# Patient Record
Sex: Female | Born: 1955 | Race: White | Hispanic: No | Marital: Married | Smoking: Never smoker
Health system: Southern US, Community
[De-identification: ages and names within clinical notes are randomized; demographics above are authoritative.]

## PROBLEM LIST (undated history)

## (undated) DIAGNOSIS — N200 Calculus of kidney: Secondary | ICD-10-CM

## (undated) DIAGNOSIS — E079 Disorder of thyroid, unspecified: Secondary | ICD-10-CM

## (undated) DIAGNOSIS — H1855 Macular corneal dystrophy: Secondary | ICD-10-CM

## (undated) DIAGNOSIS — Z8639 Personal history of other endocrine, nutritional and metabolic disease: Secondary | ICD-10-CM

## (undated) DIAGNOSIS — Z87442 Personal history of urinary calculi: Secondary | ICD-10-CM

## (undated) DIAGNOSIS — Z8669 Personal history of other diseases of the nervous system and sense organs: Secondary | ICD-10-CM

## (undated) DIAGNOSIS — I10 Essential (primary) hypertension: Secondary | ICD-10-CM

## (undated) DIAGNOSIS — M1990 Unspecified osteoarthritis, unspecified site: Secondary | ICD-10-CM

## (undated) HISTORY — DX: Personal history of other endocrine, nutritional and metabolic disease: Z86.39

## (undated) HISTORY — PX: ENDOMETRIAL ABLATION: SHX621

## (undated) HISTORY — DX: Personal history of urinary calculi: Z87.442

## (undated) HISTORY — PX: CORNEAL TRANSPLANT: SHX108

## (undated) HISTORY — DX: Macular corneal dystrophy: H18.55

## (undated) HISTORY — DX: Personal history of other diseases of the nervous system and sense organs: Z86.69

## (undated) HISTORY — PX: OTHER SURGICAL HISTORY: SHX169

---

## 2001-08-03 ENCOUNTER — Inpatient Hospital Stay (HOSPITAL_COMMUNITY): Admission: AD | Admit: 2001-08-03 | Discharge: 2001-08-03 | Payer: Self-pay | Admitting: Obstetrics and Gynecology

## 2001-09-02 ENCOUNTER — Inpatient Hospital Stay (HOSPITAL_COMMUNITY): Admission: AD | Admit: 2001-09-02 | Discharge: 2001-09-05 | Payer: Self-pay | Admitting: Obstetrics and Gynecology

## 2001-10-14 ENCOUNTER — Other Ambulatory Visit: Admission: RE | Admit: 2001-10-14 | Discharge: 2001-10-14 | Payer: Self-pay | Admitting: Obstetrics and Gynecology

## 2003-04-22 DIAGNOSIS — H18559 Macular corneal dystrophy, unspecified eye: Secondary | ICD-10-CM

## 2003-04-22 HISTORY — DX: Macular corneal dystrophy, unspecified eye: H18.559

## 2003-07-04 ENCOUNTER — Other Ambulatory Visit: Admission: RE | Admit: 2003-07-04 | Discharge: 2003-07-04 | Payer: Self-pay | Admitting: Obstetrics and Gynecology

## 2004-06-10 ENCOUNTER — Encounter: Admission: RE | Admit: 2004-06-10 | Discharge: 2004-06-10 | Payer: Self-pay | Admitting: Emergency Medicine

## 2005-02-05 ENCOUNTER — Encounter: Admission: RE | Admit: 2005-02-05 | Discharge: 2005-02-05 | Payer: Self-pay | Admitting: Emergency Medicine

## 2005-02-17 ENCOUNTER — Other Ambulatory Visit: Admission: RE | Admit: 2005-02-17 | Discharge: 2005-02-17 | Payer: Self-pay | Admitting: Obstetrics and Gynecology

## 2006-05-05 ENCOUNTER — Encounter: Admission: RE | Admit: 2006-05-05 | Discharge: 2006-05-05 | Payer: Self-pay | Admitting: Emergency Medicine

## 2006-06-08 ENCOUNTER — Encounter: Admission: RE | Admit: 2006-06-08 | Discharge: 2006-06-08 | Payer: Self-pay | Admitting: Emergency Medicine

## 2006-07-07 ENCOUNTER — Encounter: Admission: RE | Admit: 2006-07-07 | Discharge: 2006-07-07 | Payer: Self-pay | Admitting: Diagnostic Radiology

## 2006-07-15 ENCOUNTER — Encounter: Admission: RE | Admit: 2006-07-15 | Discharge: 2006-07-15 | Payer: Self-pay | Admitting: Diagnostic Radiology

## 2006-08-05 ENCOUNTER — Encounter: Admission: RE | Admit: 2006-08-05 | Discharge: 2006-08-05 | Payer: Self-pay | Admitting: Diagnostic Radiology

## 2006-08-11 ENCOUNTER — Encounter: Admission: RE | Admit: 2006-08-11 | Discharge: 2006-08-11 | Payer: Self-pay | Admitting: Diagnostic Radiology

## 2006-08-23 ENCOUNTER — Observation Stay (HOSPITAL_COMMUNITY): Admission: AD | Admit: 2006-08-23 | Discharge: 2006-08-24 | Payer: Self-pay | Admitting: Obstetrics and Gynecology

## 2006-09-01 ENCOUNTER — Encounter: Admission: RE | Admit: 2006-09-01 | Discharge: 2006-09-01 | Payer: Self-pay | Admitting: Diagnostic Radiology

## 2007-02-18 ENCOUNTER — Encounter: Admission: RE | Admit: 2007-02-18 | Discharge: 2007-02-18 | Payer: Self-pay | Admitting: Diagnostic Radiology

## 2007-05-31 IMAGING — CR DG ABDOMEN 2V
3 series · 3 of 3 positions shown · non-contrast
Comparison: None.

CLINICAL DATA: 50-year-old, abdominal pain. 
 ABDOMEN - 2 VIEW:

[view not recorded (1 of 3)]
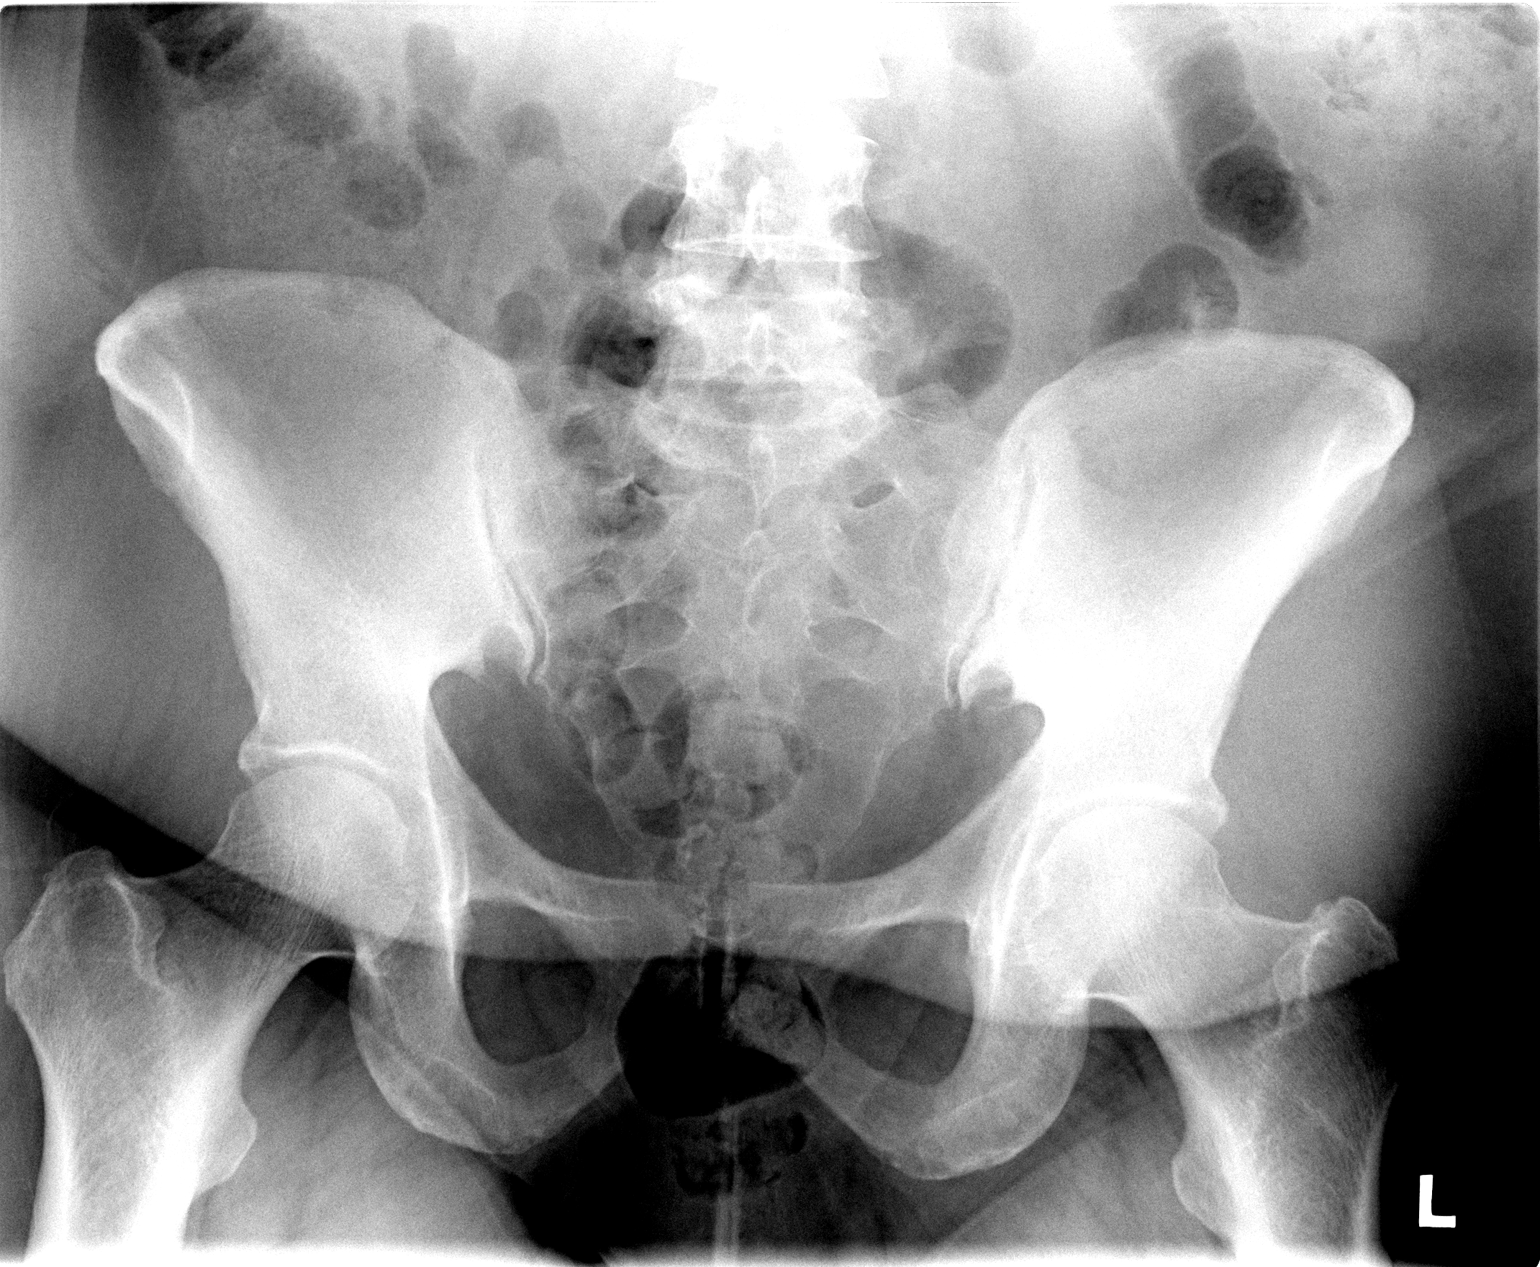

[view not recorded (2 of 3)]
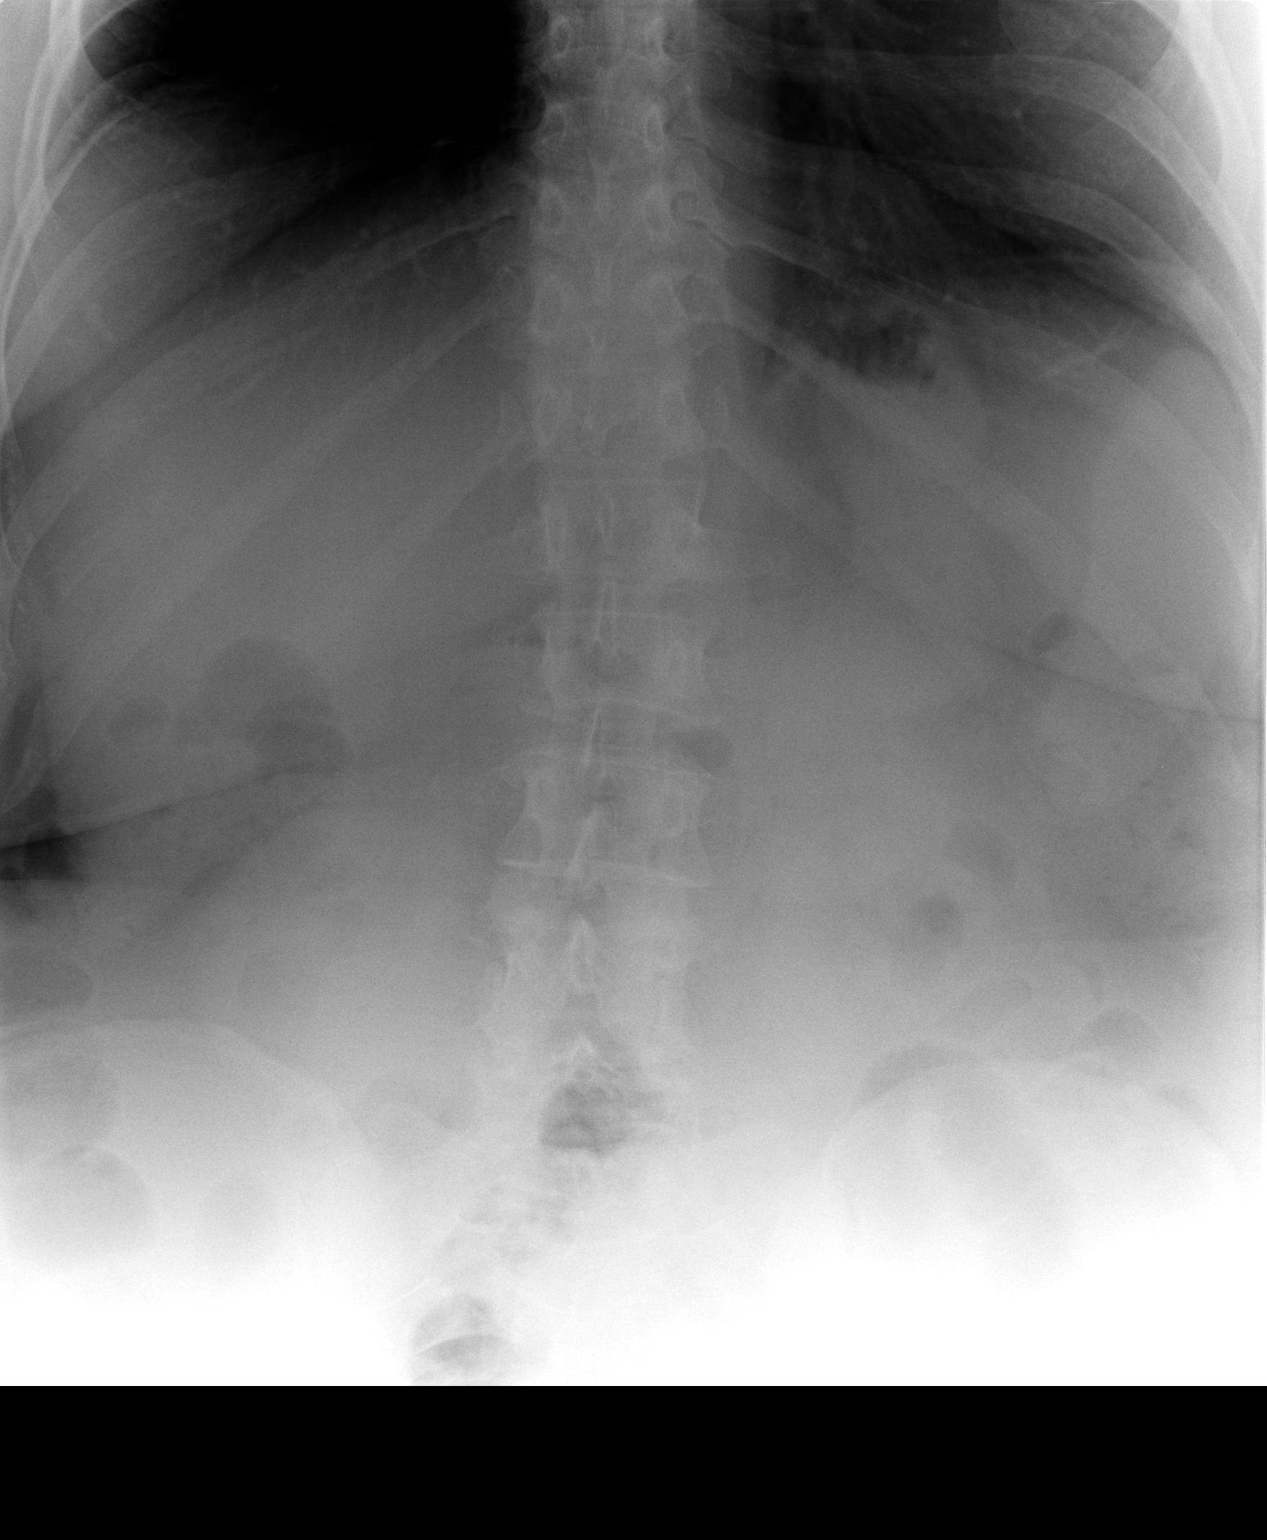

[view not recorded (3 of 3)]
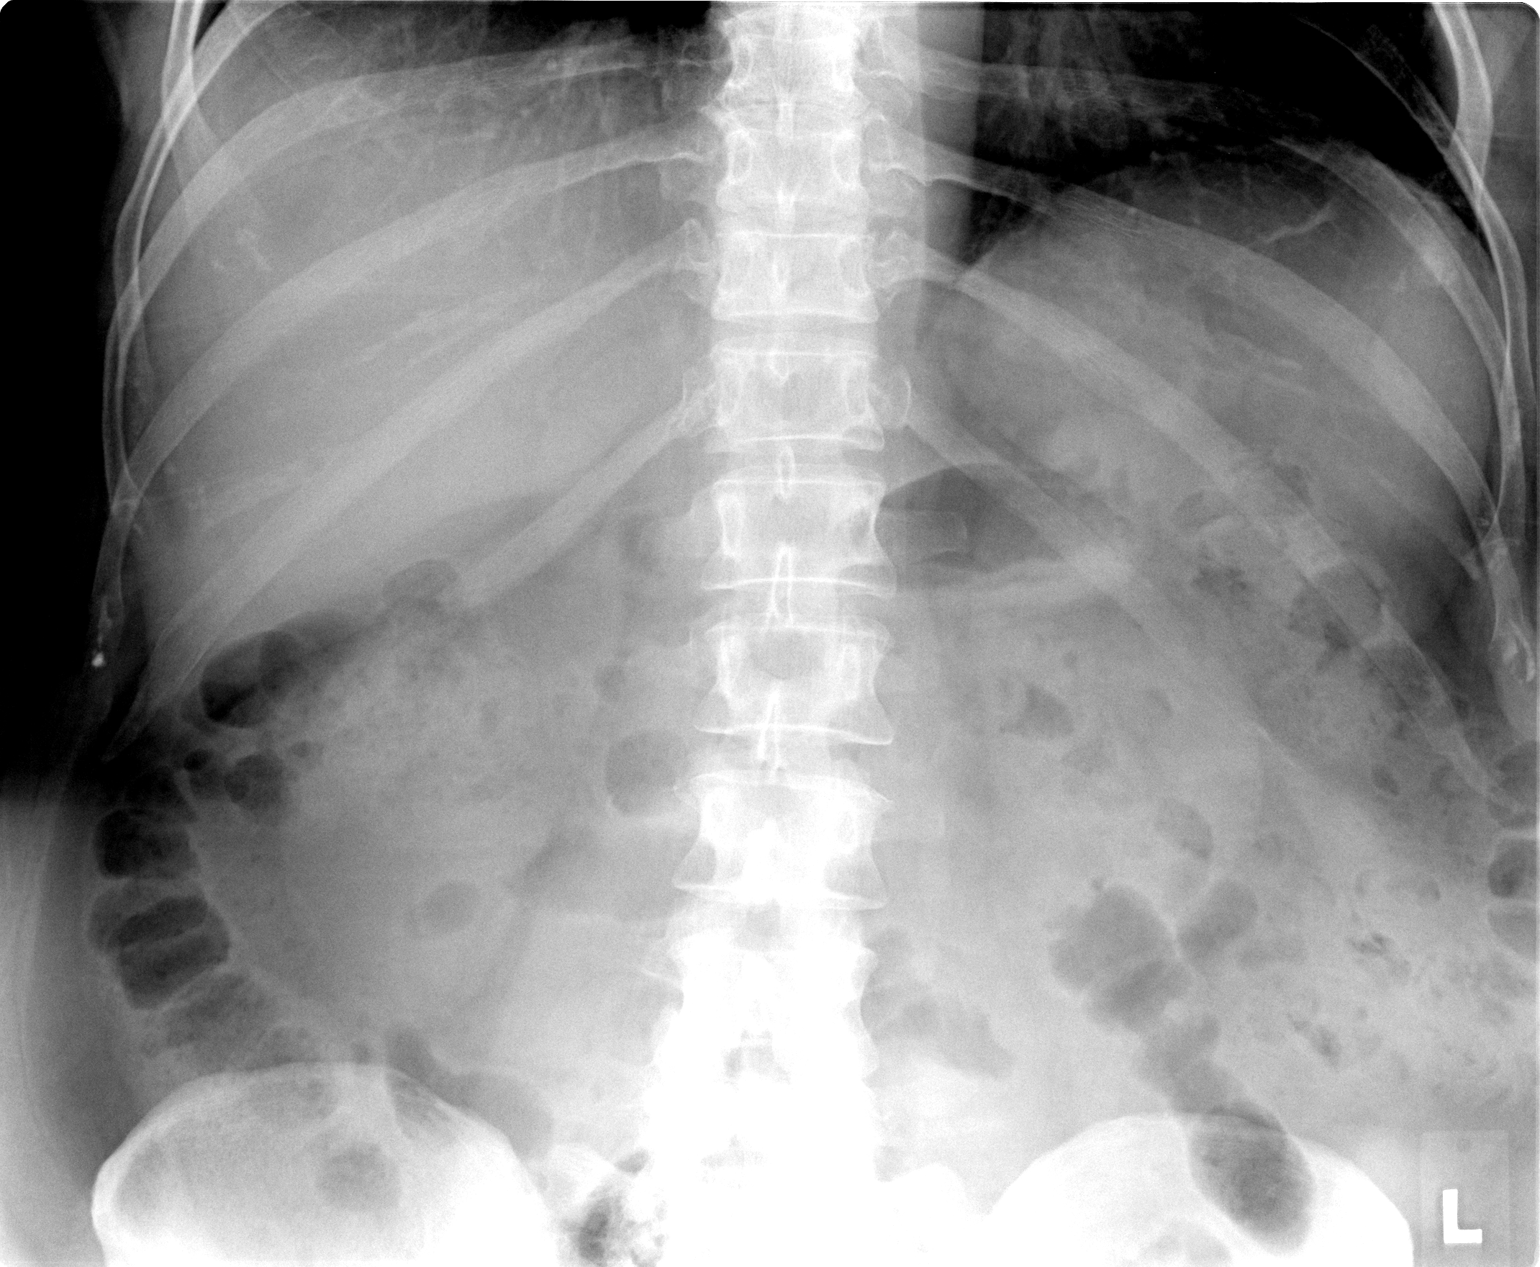

[3 of 3 positions shown; findings below may reference images not displayed]

FINDINGS: There is scattered air and stool throughout the colon along with scattered loops of small bowel with air, but no significant distention or air fluid levels.  No free air.  The soft tissue shadows of the abdomen are grossly maintained.  No worrisome calcifications are seen.  Bony structures are grossly normal.
IMPRESSION: No plain film evidence of acute abdominal process.  Mild constipation.

## 2007-08-25 ENCOUNTER — Encounter: Admission: RE | Admit: 2007-08-25 | Discharge: 2007-08-25 | Payer: Self-pay | Admitting: Emergency Medicine

## 2009-03-08 ENCOUNTER — Encounter: Admission: RE | Admit: 2009-03-08 | Discharge: 2009-03-08 | Payer: Self-pay | Admitting: Internal Medicine

## 2009-03-28 ENCOUNTER — Encounter: Admission: RE | Admit: 2009-03-28 | Discharge: 2009-03-28 | Payer: Self-pay | Admitting: Interventional Radiology

## 2011-10-11 ENCOUNTER — Emergency Department (HOSPITAL_COMMUNITY): Payer: 59

## 2011-10-11 ENCOUNTER — Emergency Department (HOSPITAL_COMMUNITY)
Admission: EM | Admit: 2011-10-11 | Discharge: 2011-10-12 | Disposition: A | Discharge status: Home / Self Care | Payer: 59 | Attending: Emergency Medicine | Admitting: Emergency Medicine

## 2011-10-11 ENCOUNTER — Encounter (HOSPITAL_COMMUNITY): Payer: Self-pay | Admitting: Emergency Medicine

## 2011-10-11 ENCOUNTER — Encounter (HOSPITAL_COMMUNITY): Payer: Self-pay

## 2011-10-11 ENCOUNTER — Emergency Department (INDEPENDENT_AMBULATORY_CARE_PROVIDER_SITE_OTHER)
Admission: EM | Admit: 2011-10-11 | Discharge: 2011-10-11 | Disposition: A | Discharge status: Nursing Home (Includes ICF) | Payer: 59 | Source: Home / Self Care | Attending: Family Medicine | Admitting: Family Medicine

## 2011-10-11 DIAGNOSIS — N200 Calculus of kidney: Secondary | ICD-10-CM | POA: Insufficient documentation

## 2011-10-11 DIAGNOSIS — M545 Low back pain, unspecified: Secondary | ICD-10-CM | POA: Insufficient documentation

## 2011-10-11 DIAGNOSIS — Z79899 Other long term (current) drug therapy: Secondary | ICD-10-CM | POA: Insufficient documentation

## 2011-10-11 DIAGNOSIS — E079 Disorder of thyroid, unspecified: Secondary | ICD-10-CM | POA: Insufficient documentation

## 2011-10-11 DIAGNOSIS — M51379 Other intervertebral disc degeneration, lumbosacral region without mention of lumbar back pain or lower extremity pain: Secondary | ICD-10-CM | POA: Insufficient documentation

## 2011-10-11 DIAGNOSIS — K7689 Other specified diseases of liver: Secondary | ICD-10-CM | POA: Insufficient documentation

## 2011-10-11 DIAGNOSIS — I10 Essential (primary) hypertension: Secondary | ICD-10-CM | POA: Insufficient documentation

## 2011-10-11 DIAGNOSIS — M5137 Other intervertebral disc degeneration, lumbosacral region: Secondary | ICD-10-CM | POA: Insufficient documentation

## 2011-10-11 DIAGNOSIS — R109 Unspecified abdominal pain: Secondary | ICD-10-CM | POA: Insufficient documentation

## 2011-10-11 HISTORY — DX: Unspecified osteoarthritis, unspecified site: M19.90

## 2011-10-11 HISTORY — DX: Calculus of kidney: N20.0

## 2011-10-11 HISTORY — DX: Disorder of thyroid, unspecified: E07.9

## 2011-10-11 HISTORY — DX: Essential (primary) hypertension: I10

## 2011-10-11 LAB — POCT URINALYSIS DIP (DEVICE)
Glucose, UA: NEGATIVE mg/dL
Nitrite: NEGATIVE
Urobilinogen, UA: 0.2 mg/dL (ref 0.0–1.0)

## 2011-10-11 LAB — POCT I-STAT, CHEM 8
HCT: 46 % (ref 36.0–46.0)
Hemoglobin: 15.6 g/dL — ABNORMAL HIGH (ref 12.0–15.0)
Potassium: 3.7 mEq/L (ref 3.5–5.1)
Sodium: 141 mEq/L (ref 135–145)

## 2011-10-11 NOTE — ED Notes (Signed)
Report given to [facility], ed triage nurse. Pt transported by shuttle.

## 2011-10-11 NOTE — ED Notes (Signed)
Pt states she has been burning with urination which started last night

## 2011-10-11 NOTE — ED Notes (Signed)
Offered pt and family coffee.

## 2011-10-11 NOTE — ED Notes (Signed)
Lower back  pain last night and this morning still having pain, vomited once. Took some pain med and it get better, but n today the pain got worse. Went to urgent and they did UA and it was clear of UTI but I still cont to have left flank pain . Hx of kidney stone in past.

## 2011-10-11 NOTE — ED Provider Notes (Signed)
History     CSN: [id number]  Arrival date & time 10/11/11  1457   First MD Initiated Contact with Patient 10/11/11 1500      Chief Complaint  Patient presents with  . Urinary Tract Infection    painful urination    (Consider location/radiation/quality/duration/timing/severity/associated sxs/prior treatment) Patient is a 56 y.o. female presenting with flank pain. The history is provided by the patient.  Flank Pain This is a new problem. The current episode started 6 to 12 hours ago. The problem has been gradually improving (took 2 vicodin and a phenergan for pain and nausea). Pertinent negatives include no abdominal pain. Nothing aggravates the symptoms.    Past Medical History  Diagnosis Date  . Hypertension   . Thyroid disease   . Osteoarthritis     History reviewed. No pertinent past surgical history.  History reviewed. No pertinent family history.  History  Substance Use Topics  . Smoking status: Never Smoker   . Smokeless tobacco: Not on file  . Alcohol Use: No    OB History    Grav Para Term Preterm Abortions TAB SAB Ect Mult Living                  Review of Systems  Constitutional: Negative.   Gastrointestinal: Negative for abdominal pain.  Genitourinary: Positive for dysuria and flank pain. Negative for urgency, frequency, hematuria, vaginal bleeding, vaginal discharge and pelvic pain.    Allergies  Penicillins; Shellfish allergy; and Sulfa antibiotics  Home Medications   Current Outpatient Rx  Name Route Sig Dispense Refill  . AMLODIPINE BESY-BENAZEPRIL HCL 5-10 MG PO CAPS Oral Take 1 capsule by mouth daily.    [name] Kitchen HYDROCHLOROTHIAZIDE 25 MG PO TABS Oral Take 25 mg by mouth daily.    . MELOXICAM 15 MG PO TABS Oral Take 15 mg by mouth daily.    . THYROID 90 MG PO TABS Oral Take 90 mg by mouth daily.    [name] Kitchen DOXYCYCLINE HYCLATE 100 MG PO CPEP Oral Take 100 mg by mouth 2 (two) times daily.      BP 153/75  Pulse 71  Temp 98 F (36.7 C) (Oral)  Resp  18  SpO2 98%  Physical Exam  Nursing note and vitals reviewed. Constitutional: She is oriented to person, place, and time. She appears well-developed and well-nourished.  Abdominal: Soft. Bowel sounds are normal. There is no tenderness. There is no rebound.  Musculoskeletal: She exhibits tenderness.       Arms: Neurological: She is alert and oriented to person, place, and time.  Skin: Skin is warm and dry.    ED Course  Procedures (including critical care time)  Labs Reviewed  POCT URINALYSIS DIP (DEVICE) - Abnormal; Notable for the following:    Bilirubin Urine SMALL (*)     All other components within normal limits   No results found.   1. Left flank pain       MDM  U/a neg.        Linna Hoff, MD 10/11/11 [phone]

## 2011-10-11 NOTE — ED Notes (Signed)
PT. SENT FROM [facility] , REPORTS LOW BACK PAIN ONSET LAST NIGHT , STATES HISTORY OF KIDNEY STONES , DENIES DYSURIA OR HEMATURIA , URINALYSIS DONE AT URGENT CARE .

## 2011-10-12 NOTE — ED Provider Notes (Signed)
History     CSN: [id number]  Arrival date & time 10/11/11  1905   First MD Initiated Contact with Patient 10/11/11 2222      Chief Complaint  Patient presents with  . Back Pain   HPI  History provided by the patient. Patient is a 56 year old female with history of hypertension, kidney stones who presents with complaints of right flank and low back pain. Patient was sent from urgent care Center for further evaluation. Patient states that her pain began earlier in the day and has been persistent. Pain is primarily over the right flank and low back area but occasionally to the left side as well. Patient had a hearing test performed at the urgent care did not show any signs for infection the patient was referred to the emergency room for further workup to rule out kidney stone. Patient does state pain feels slightly similar to previous kidney stone pain. She denies having any dysuria, hematuria, urinary frequency. Patient did have one episode of nausea and vomiting in the evening. Patient did have an old prescription of Phenergan at home and use this for her nausea and vomiting symptoms. She also used Vicodin at home for her pain. Patient currently states she has no complaints. She denies any other aggravating or alleviating factors.   Past Medical History  Diagnosis Date  . Hypertension   . Thyroid disease   . Osteoarthritis   . Kidney stone     Past Surgical History  Procedure Date  . Endometrial ablation   . Dilatation and curettage   . Corneal transplant     No family history on file.  History  Substance Use Topics  . Smoking status: Never Smoker   . Smokeless tobacco: Not on file  . Alcohol Use: No    OB History    Grav Para Term Preterm Abortions TAB SAB Ect Mult Living                  Review of Systems  Constitutional: Negative for fever and chills.  Gastrointestinal: Positive for nausea and vomiting. Negative for abdominal pain, diarrhea and constipation.    Genitourinary: Positive for flank pain. Negative for dysuria, frequency, hematuria, vaginal bleeding and vaginal discharge.  Skin: Negative for rash.    Allergies  Penicillins; Shellfish allergy; and Sulfa antibiotics  Home Medications   Current Outpatient Rx  Name Route Sig Dispense Refill  . AMLODIPINE BESY-BENAZEPRIL HCL 5-10 MG PO CAPS Oral Take 1 capsule by mouth daily.    [name] Kitchen DOXYCYCLINE HYCLATE 100 MG PO CPEP Oral Take 100 mg by mouth 2 (two) times daily.    [name] Kitchen ESTRADIOL-NORETHINDRONE ACET 1-0.5 MG PO TABS Oral Take 1 tablet by mouth daily.    [name] Kitchen HYDROCHLOROTHIAZIDE 25 MG PO TABS Oral Take 25 mg by mouth daily.    [name] Kitchen HYDROCODONE-ACETAMINOPHEN 5-500 MG PO TABS Oral Take 1 tablet by mouth every 6 (six) hours as needed. For pain    . MELOXICAM 15 MG PO TABS Oral Take 15 mg by mouth daily.    . THYROID 90 MG PO TABS Oral Take 90 mg by mouth daily.    . TRAMADOL HCL 50 MG PO TABS Oral Take 50 mg by mouth every 6 (six) hours as needed. For pain      BP 146/68  Pulse 84  Temp 98.3 F (36.8 C) (Oral)  Resp 18  SpO2 98%  Physical Exam  Nursing note and vitals reviewed. Constitutional: She is oriented to person, place,  and time. She appears well-developed and well-nourished. No distress.  HENT:  Head: Normocephalic and atraumatic.  Cardiovascular: Normal rate and regular rhythm.   Pulmonary/Chest: Effort normal and breath sounds normal.  Abdominal: Soft. There is CVA tenderness. There is no rigidity, no rebound, no guarding and no tenderness at McBurney's point.       Mild right CVA tenderness  Musculoskeletal:       Cervical back: Normal.       Thoracic back: Normal.       Lumbar back: She exhibits no bony tenderness.  Neurological: She is alert and oriented to person, place, and time. She has normal strength. No sensory deficit. Gait normal.  Skin: Skin is warm and dry. No rash noted.  Psychiatric: She has a normal mood and affect. Her behavior is normal.    ED Course   Procedures    Labs Reviewed  POCT I-STAT, CHEM 8 - Abnormal; Notable for the following:    Hemoglobin 15.6 (*)     All other components within normal limits   Ct Abdomen Pelvis Wo Contrast  10/11/2011  *RADIOLOGY REPORT*  Clinical Data: Flank pain and nausea.  CT ABDOMEN AND PELVIS WITHOUT CONTRAST  Technique:  Multidetector CT imaging of the abdomen and pelvis was performed following the standard protocol without intravenous contrast.  Comparison: CT of the abdomen and pelvis from 02/05/2005  Findings: The visualized lung bases are clear.  There is mild diffuse fatty infiltration within the liver, with areas of fatty sparing; fatty infiltration is more prominent adjacent to the falciform ligament.  The liver is otherwise unremarkable in appearance.  The spleen is within normal limits. The gallbladder is grossly unremarkable.  The pancreas and adrenal glands are within normal limits.  There is no evidence of hydronephrosis.  A tiny 2 mm stone is noted at the interpole region of the left kidney, on coronal images.  The kidneys are otherwise unremarkable in appearance.  No obstructing ureteral stones are identified.  Minimal left-sided perinephric stranding is seen.  No free fluid is identified.  The small bowel is unremarkable in appearance.  The stomach is within normal limits.  No acute vascular abnormalities are seen.  A mildly prominent peripancreatic node is seen, measuring 1.2 cm in short axis.  No additional lymphadenopathy is seen.  The appendix is normal in caliber, without evidence for appendicitis.  The colon is largely decompressed and unremarkable in appearance.  The bladder is mildly distended and grossly unremarkable in appearance.  The uterus may contain a 3.9 cm fibroid posteriorly. It is otherwise grossly unremarkable in appearance, though difficult to fully assess without contrast.  The ovaries are relatively symmetric; no suspicious adnexal masses are seen.  No inguinal lymphadenopathy  is seen.  No acute osseous abnormalities are identified.  Vacuum phenomenon and disc space narrowing are noted at L5-S1, with mild associated facet disease at the lower lumbar spine.  IMPRESSION:  1.  No acute abnormalities seen within the abdomen or pelvis. 2.  Tiny 2 mm nonobstructing stone at the interpole region of the left kidney; kidneys otherwise unremarkable in appearance. 3.  Mildly prominent peripancreatic node, measuring 1.2 cm in short axis; this has decreased in size since 2006, and likely reflects the patient's baseline. 4.  Likely 3.9 cm fibroid at the posterior aspect of the uterus, increased in size from the prior study. 5.  Mild diffuse fatty infiltration within the liver.  Original Report Authenticated By: Tonia Ghent, M.D.   Dg Lumbar Spine Complete  10/11/2011  *RADIOLOGY REPORT*  Clinical Data: Low back pain  LUMBAR SPINE - COMPLETE 4+ VIEW  Comparison: 06/10/2004  Findings: Negative for fracture.  Minimal anterior slip L4-5.  Moderate to advanced disc degeneration and spondylosis L5 S1.  Disc degeneration and spurring has progressed from the prior study. Negative for pars defect.  Facet joints are intact.  IMPRESSION: Advanced disc degeneration and spondylosis L5-S1.  No acute bony abnormality.  Original Report Authenticated By: Camelia Phenes, M.D.     1. Flank pain       MDM  Patient seen and evaluated. Patient no acute distress.    Patient currently without significant symptoms. CT and lab work unremarkable for any cause of symptoms. No signs of obstructing stone. I did discuss with patient findings of pancreatic nodule. We also discussed findings of fibroid. Patient will plan to followup with OB/GYN.  Angus Seller, [place] 10/12/11 [phone]

## 2011-10-12 NOTE — Discharge Instructions (Signed)
You were seen and evaluated for your flank and back pains. Your blood tests and CAT scan did not show any concerning or emergent causes to explain your symptoms. At this time he reported your pain and symptoms have improved. Your providers feel you may return home and followup with your primary care provider for continued evaluation and treatment. Return to emergency room if you have any worsening pain, fever, chills, persistent nausea vomiting.   Flank Pain Flank pain refers to pain that is located on the side of the body between the upper abdomen and the back. It can be caused by many things. CAUSES  Some of the more common causes of flank pain include:  Muscle strain.   Muscle spasms.   A disease of your spine (vertebral disk disease).   A lung infection (pneumonia).   Fluid around your lungs (pulmonary edema).   A kidney infection.   Kidney stones.   A very painful skin rash on only one side of your body (shingles).   Gallbladder disease.  DIAGNOSIS  Blood tests, urine tests, and X-rays may help your caregiver determine what is wrong. TREATMENT  The treatment of pain depends on the cause. Your caregiver will determine what treatment will work best for you. HOME CARE INSTRUCTIONS   Home care will depend on the cause of your pain.   Some medications may help relieve the pain. Take medication for relief of pain as directed by your caregiver.   Tell your caregiver about any changes in your pain.   Follow up with your caregiver.  SEEK IMMEDIATE MEDICAL CARE IF:   Your pain is not controlled with medication.   The pain increases.   You have abdominal pain.   You have shortness of breath.   You have persistent nausea or vomiting.   You have swelling in your abdomen.   You feel faint or pass out.   You have a temperature by mouth above 102 F (38.9 C), not controlled by medicine.  MAKE SURE YOU:   Understand these instructions.   Will watch your condition.    Will get help right away if you are not doing well or get worse.  Document Released: 05/29/2005 Document Revised: 03/27/2011 Document Reviewed: 09/22/2009 [facility] Patient Information 2012 [place], [place].    RESOURCE GUIDE  Chronic Pain Problems: Contact [facility]  [phone] Patients need to be referred by their primary care doctor.  Insufficient Money for Medicine: Contact United Way:  call "211" or [facility] [phone].  No Primary Care Doctor: - Call [facility] Connect  [phone] - can help you locate a primary care doctor that  accepts your insurance, provides certain services, etc. - Physician Referral [phone]  Agencies that provide inexpensive medical care: - [facility]  [phone] - [facility]  [phone] - [facility]  [phone] [facility]  [phone] - Planned Parenthood  [phone] [facility]  [phone]  Medicaid-accepting [facility] Providers: - [facility]- [address]  [phone], Mon-Fri 9am-7pm, Sat 9am-1pm - [facility]- [address] [place], [address] [place]  [phone] - [facility]- [address], [address] [place]  [phone] [facility]- [address]  [phone] - Renaye Rakers- [address] [place], [address], [phone]  Only accepts [place] patients after they have their name  applied to their card  Self  Pay (no insurance) in [facility]: - Sickle Cell Patients: Dr Willey Blade, [facility]  [address] [place], [phone] - [facility]- [address] [place]  [phone]       Patrcia Dolly [facility] [place]- [address]       -     [facility]- see information above (Speak to [employer] if you do not have insurance)       -  [facility]- [address] [place], [phone]       -  [facility] [place]- [address],  [phone]       -  [facility]- [address] [phone]       -  Dr Julio Sicks-  [address], [place], [phone]       -  [facility]- [address], [phone]       -  [facility]- [address] [phone], also [address] [phone]       -    [facility]- [address] [place], [phone], 1st & 3rd Saturday   every month, 10am-1pm  1) Find a Doctor and Pay Out of Pocket Although you won't have to find out who is covered by your insurance plan, it is a good idea to ask around and get recommendations. You will then need to call the office and see if the doctor you have chosen will accept you as a new patient and what types of options they offer for patients who are self-pay. Some doctors offer discounts or will set up payment plans for their patients who do not have insurance, but you will need to ask so you aren't surprised when you get to your appointment.  2) Contact Your Local Health Department Not all health departments have doctors that can see patients for sick visits, but many do, so it is worth a call to see if yours does. If you don't know where your local health department is, you can check in your phone book. The CDC also has a tool to help you locate your state's health department, and many state websites also have listings of all of their local health departments.  3) Find a Walk-in Clinic If your illness is not likely to be very severe or complicated, you may want to try a walk in clinic. These are popping up all over the country in pharmacies, drugstores, and shopping centers. They're usually staffed by nurse practitioners or physician assistants that have been trained to treat common illnesses and complaints. They're usually fairly quick and inexpensive. However, if you have serious medical issues or chronic medical problems, these are probably not your best  option  STD Testing - [facility] Department of [facility], STD Clinic, [address], [place], phone [phone] or [phone].  Monday - Friday, call for an appointment. [facility] Department of [employer], STD Clinic, [address], [place], phone [phone] or [phone].  Monday - Friday, call for an appointment.  Abuse/Neglect: [facility] Child Abuse Hotline 417-038-3744 [facility] Child Abuse Hotline 517-318-4383 (After Hours)  Emergency Shelter:  [facility] 631-643-7725  Maternity Homes: - [facility] 847-160-9287 - [facility] [phone]  MRSA Hotline #:   301-835-4174  Aaron Edelman  [employer]  [facility] [place]  [employer] [facility] Dept. [address]                 [address]         [address] [place] [address]  Blondell Reveal Phone:  [phone]                                  Phone:  [phone]                   Phone:  [phone]  [facility] Mental Health, [phone] - [phone]       -     [facility] in [place], [address],                                  [phone], [facility] Child Abuse Hotline (978) 001-9986 or [phone] (After Hours)   Behavioral Health Services  Substance Abuse Resources: - Alcohol and Drug Services  581-330-5436 - [facility] 629-026-0796 - [facility] [phone] Floydene Flock [phone] - Residential & Outpatient Substance Abuse Program  ([phone]  Psychological Services: [facility]  [phone] Services  [phone] - [facility], [phone] [place]. [address] [place], ACCESS LINE: 9413462445 or  [phone], [employer]  Dental Assistance  If unable to pay or uninsured, contact:  [facility] or [facility]. to become qualified for the adult dental clinic.  Patients with Medicaid: [facility] [phone], [phone] [address] [phone]  If unable to pay, or uninsured, contact [facility] [phone]) or [facility] Department [phone] in [place], [phone] in [facility]) to become qualified for the adult dental clinic  Other Low-Cost Community Dental Services: - [facility]- [address] [place], [place], [place], [address], [phone], [phone], 2nd and 4th Thursday of the month at 6:30am.  10 clients each day by appointment, can sometimes see walk-in patients if someone does not show for an appointment. [facility]- [address] [place], [place], [address], [phone] - [facility]- [address], [place], [place], [address], [phone] - [facility]- [phone] [facility] Health Department- [phone] [facility] Department- [phone]

## 2011-10-12 NOTE — ED Provider Notes (Signed)
Medical screening examination/treatment/procedure(s) were performed by non-physician practitioner and as supervising physician I was immediately available for consultation/collaboration.  Curry Seefeldt, MD 10/12/11 0729 

## 2011-11-24 ENCOUNTER — Ambulatory Visit (INDEPENDENT_AMBULATORY_CARE_PROVIDER_SITE_OTHER): Payer: 59 | Admitting: Surgery

## 2011-12-02 ENCOUNTER — Ambulatory Visit (INDEPENDENT_AMBULATORY_CARE_PROVIDER_SITE_OTHER): Payer: 59 | Admitting: Surgery

## 2011-12-02 ENCOUNTER — Encounter (INDEPENDENT_AMBULATORY_CARE_PROVIDER_SITE_OTHER): Payer: Self-pay | Admitting: Surgery

## 2011-12-02 VITALS — BP 160/88 | HR 88 | Temp 97.5°F | Resp 18 | Ht 64.0 in | Wt 256.0 lb

## 2011-12-02 DIAGNOSIS — D449 Neoplasm of uncertain behavior of unspecified endocrine gland: Secondary | ICD-10-CM

## 2011-12-02 DIAGNOSIS — E039 Hypothyroidism, unspecified: Secondary | ICD-10-CM

## 2011-12-02 DIAGNOSIS — E042 Nontoxic multinodular goiter: Secondary | ICD-10-CM | POA: Insufficient documentation

## 2011-12-02 DIAGNOSIS — D440 Neoplasm of uncertain behavior of thyroid gland: Secondary | ICD-10-CM

## 2011-12-02 NOTE — Patient Instructions (Signed)
Thyroid Diseases Your thyroid is a butterfly-shaped gland in your neck. It is located just above your collarbone. It is one of your endocrine glands, which make hormones. The thyroid helps set your metabolism. Metabolism is how your body gets energy from the foods you eat.  Millions of people have thyroid diseases. Women experience thyroid problems more often than men. In fact, overactive thyroid problems (hyperthyroidism) occur in 1% of all women. If you have a thyroid disease, your body may use energy more slowly or quickly than it should.  Thyroid problems also include an immune disease where your body reacts against your thyroid gland (called thyroiditis). A different problem involves lumps and bumps (called nodules) that develop in the gland. The nodules are usually, but not always, noncancerous. THE MOST COMMON THYROID PROBLEMS AND CAUSES ARE DISCUSSED BELOW There are many causes for thyroid problems. Treatment depends upon the exact diagnosis and includes trying to reset your body's metabolism to a normal rate. Hyperthyroidism Too much thyroid hormone from an overactive thyroid gland is called hyperthyroidism. In hyperthyroidism, the body's metabolism speeds up. One of the most frequent forms of hyperthyroidism is known as Graves' disease. Graves' disease tends to run in families. Although Graves' is thought to be caused by a problem with the immune system, the exact nature of the genetic problem is unknown. Hypothyroidism Too little thyroid hormone from an underactive thyroid gland is called hypothyroidism. In hypothyroidism, the body's metabolism is slowed. Several things can cause this condition. Most causes affect the thyroid gland directly and hurt its ability to make enough hormone.  Rarely, there may be a pituitary gland tumor (located near the base of the brain). The tumor can block the pituitary from producing thyroid-stimulating hormone (TSH). Your body makes TSH to stimulate the thyroid  to work properly. If the pituitary does not make enough TSH, the thyroid fails to make enough hormones needed for good health. Whether the problem is caused by thyroid conditions or by the pituitary gland, the result is that the thyroid is not making enough hormones. Hypothyroidism causes many physical and mental processes to become sluggish. The body consumes less oxygen and produces less body heat. Thyroid Nodules A thyroid nodule is a small swelling or lump in the thyroid gland. They are common. These nodules represent either a growth of thyroid tissue or a fluid-filled cyst. Both form a lump in the thyroid gland. Almost half of all people will have tiny thyroid nodules at some point in their lives. Typically, these are not noticeable until they become large and affect normal thyroid size. Larger nodules that are greater than a half inch across (about 1 centimeter) occur in about 5 percent of people. Although most nodules are not cancerous, people who have them should seek medical care to rule out cancer. Also, some thyroid nodules may produce too much thyroid hormone or become too large. Large nodules or a large gland can interfere with breathing or swallowing or may cause neck discomfort. Other problems Other thyroid problems include cancer and thyroiditis. Thyroiditis is a malfunction of the body's immune system. Normally, the immune system works to defend the body against infection and other problems. When the immune system is not working properly, it may mistakenly attack normal cells, tissues, and organs. Examples of autoimmune diseases are Hashimoto's thyroiditis (which causes low thyroid function) and Graves' disease (which causes excess thyroid function). SYMPTOMS  Symptoms vary greatly depending upon the exact type of problem with the thyroid. Hyperthyroidism-is when your thyroid is too   active and makes more thyroid hormone than your body needs. The most common cause is Graves' Disease. Too  much thyroid hormone can cause some or all of the following symptoms:  Anxiety.   Irritability.   Difficulty sleeping.   Fatigue.   A rapid or irregular heartbeat.   A fine tremor of your hands or fingers.   An increase in perspiration.   Sensitivity to heat.   Weight loss, despite normal food intake.   Brittle hair.   Enlargement of your thyroid gland (goiter).   Light menstrual periods.   Frequent bowel movements.  Graves' disease can specifically cause eye and skin problems. The skin problems involve reddening and swelling of the skin, often on your shins and on the top of your feet. Eye problems can include the following:  Excess tearing and sensation of grit or sand in either or both eyes.   Reddened or inflamed eyes.   Widening of the space between your eyelids.   Swelling of the lids and tissues around the eyes.   Light sensitivity.   Ulcers on the cornea.   Double vision.   Limited eye movements.   Blurred or reduced vision.  Hypothyroidism- is when your thyroid gland is not active enough. This is more common than hyperthyroidism. Symptoms can vary a lot depending of the severity of the hormone deficiency. Symptoms may develop over a long period of time and can include several of the following:  Fatigue.   Sluggishness.   Increased sensitivity to cold.   Constipation.   Pale, dry skin.   A puffy face.   Hoarse voice.   High blood cholesterol level.   Unexplained weight gain.   Muscle aches, tenderness and stiffness.   Pain, stiffness or swelling in your joints.   Muscle weakness.   Heavier than normal menstrual periods.   Brittle fingernails and hair.   Depression.  Thyroid Nodules - most do not cause signs or symptoms. Occasionally, some may become so large that you can feel or even see the swelling at the base of your neck. You may realize a lump or swelling is there when you are shaving or putting on makeup. Men might become  aware of a nodule when shirt collars suddenly feel too tight. Some nodules produce too much thyroid hormone. This can produce the same symptoms as hyperthyroidism (see above). Thyroid nodules are seldom cancerous. However, a nodule is more likely to be malignant (cancerous) if it:  Grows quickly or feels hard.   Causes you to become hoarse or to have trouble swallowing or breathing.   Causes enlarged lymph nodes under your jaw or in your neck.  DIAGNOSIS  Because there are so many possible thyroid conditions, your caregiver may ask for a number of tests. They will do this in order to narrow down the exact diagnosis. These tests can include:  Blood and antibody tests.   Special thyroid scans using small, safe amounts of radioactive iodine.   Ultrasound of the thyroid gland (particularly if there is a nodule or lump).   Biopsy. This is usually done with a special needle. A needle biopsy is a procedure to obtain a sample of cells from the thyroid. The tissue will be tested in a lab and examined under a microscope.  TREATMENT  Treatment depends on the exact diagnosis. Hyperthyroidism  Beta-blockers help relieve many of the symptoms.   Anti-thyroid medications prevent the thyroid from making excess hormones.   Radioactive iodine treatment can destroy overactive thyroid   cells. The iodine can permanently decrease the amount of hormone produced.   Surgery to remove the thyroid gland.   Treatments for eye problems that come from Graves' disease also include medications and special eye surgery, if felt to be appropriate.  Hypothyroidism Thyroid replacement with levothyroxine is the mainstay of treatment. Treatment with thyroid replacement is usually lifelong and will require monitoring and adjustment from time to time. Thyroid Nodules  Watchful waiting. If a small nodule causes no symptoms or signs of cancer on biopsy, then no treatment may be chosen at first. Re-exam and re-checking blood  tests would be the recommended follow-up.   Anti-thyroid medications or radioactive iodine treatment may be recommended if the nodules produce too much thyroid hormone (see Treatment for Hyperthyroidism above).   Alcohol ablation. Injections of small amounts of ethyl alcohol (ethanol) can cause a non-cancerous nodule to shrink in size.   Surgery (see Treatment for Hyperthyroidism above).  HOME CARE INSTRUCTIONS   Take medications as instructed.   Follow through on recommended testing.  SEEK MEDICAL CARE IF:   You feel that you are developing symptoms of Hyperthyroidism or Hypothyroidism as described above.   You develop a new lump/nodule in the neck/thyroid area that you had not noticed before.   You feel that you are having side effects from medicines prescribed.   You develop trouble breathing or swallowing.  SEEK IMMEDIATE MEDICAL CARE IF:   You develop a fever of 102 F (38.9 C) or higher.   You develop severe sweating.   You develop palpitations and/or rapid heart beat.   You develop shortness of breath.   You develop nausea and vomiting.   You develop extreme shakiness.   You develop agitation.   You develop lightheadedness or have a fainting episode.  Document Released: 02/02/2007 Document Revised: 03/27/2011 Document Reviewed: 02/02/2007 [employer] Patient Information 2012 [employer]. 

## 2011-12-02 NOTE — Progress Notes (Signed)
General Surgery [facility]  Chief Complaint  Patient presents with  . New Evaluation    eval thyroid nodule - referral from Dr. Casimiro Needle Al[place], primary care is Dr. 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 Apparently the patient was diagnosed 10 years ago with hypothyroidism. Testing confirmed Hashimoto's thyroiditis.  Patient has been taking Armour thyroid for many years. She has a known right-sided thyroid nodule for which she underwent fine needle aspiration biopsy in 2003. She was told the cytopathology was negative. Patient underwent ultrasound in May 2013 and underwent repeat biopsy. This showed scant cellularity with a microfollicular pattern. Genetic testing was submitted. Due to low cellularity, the genetic testing was indeterminate. Patient was evaluated here in [place]. She is now referred for further recommendations regarding management of her right-sided thyroid nodule.  Patient has had no prior head or neck surgery. She has been on thyroid medication for over 10 years. She has recently switched from Armour thyroid to Synthroid. She is having levels checked at the office of her endocrinologist in the near future.  There is a family history of thyroid disease of unknown type in the patient's father. There is no family history of thyroid malignancy.  Past Medical History  Diagnosis Date  . Hypertension   . Thyroid disease   . Osteoarthritis   . Kidney stone      Current Outpatient Prescriptions  Medication Sig Dispense Refill  . amLODipine-benazepril (LOTREL) 5-10 MG per capsule Take 1 capsule by mouth daily.      [name] Kitchen doxycycline (DORYX) 100 MG DR capsule Take 100 mg by mouth 2 (two) times daily.      [name] Kitchen estradiol-norethindrone (ACTIVELLA) 1-0.5 MG per tablet Take 1 tablet by mouth daily.      .  hydrochlorothiazide (HYDRODIURIL) 25 MG tablet Take 25 mg by mouth daily.      [name] Kitchen HYDROcodone-acetaminophen (VICODIN) 5-500 MG per tablet Take 1 tablet by mouth every 6 (six) hours as needed. For pain      . meloxicam (MOBIC) 15 MG tablet Take 15 mg by mouth daily.      [name] Kitchen thyroid (ARMOUR) 90 MG tablet Take 90 mg by mouth daily.      . traMADol (ULTRAM) 50 MG tablet Take 50 mg by mouth every 6 (six) hours as needed. For pain         Allergies  Allergen Reactions  . Penicillins   . Shellfish Allergy   . Sulfa Antibiotics      No family history on file.   History   Social History  . Marital Status: Married    Spouse Name: N/A    Number of Children: N/A  . Years of Education: N/A   Social History Main Topics  . Smoking status: Never Smoker   . Smokeless tobacco: None  . Alcohol Use: No  . Drug Use: No  . Sexually Active:    Other Topics Concern  . None   Social History Narrative  . None     REVIEW OF SYSTEMS - PERTINENT POSITIVES ONLY: Patient denies tremors. She denies palpitations. She denies any new masses. She does note mild tenderness in the right neck on exam.  EXAM: Filed Vitals:   12/02/11 1323  BP: 160/88  Pulse: 88  Temp: 97.5 F (36.4 C)  Resp: 18    HEENT: normocephalic; pupils  equal and reactive; sclerae clear; dentition good; mucous membranes moist NECK:  Subtle nodularity upper right thyroid pole; symmetric on extension; no palpable anterior or posterior cervical lymphadenopathy; no supraclavicular masses; no tenderness CHEST: clear to auscultation bilaterally; few scattered wheezes on right CARDIAC: regular rate and rhythm without significant murmur; peripheral pulses are full ABDOMEN: soft without distension; bowel sounds present; no mass; no hepatosplenomegaly; no hernia EXT:  non-tender without edema; no deformity NEURO: no gross focal deficits; no sign of tremor   LABORATORY RESULTS: See [facility] (CHL-Epic) for most recent  results   RADIOLOGY RESULTS: See [facility] (CHL-Epic) for most recent results   IMPRESSION: #1 Hashimoto's thyroiditis, hypothyroidism #2 right thyroid nodule, 1.1 cm, with indeterminant cytopathology  PLAN: The patient and I discussed options for management. We discussed right thyroid lobectomy for definitive diagnosis. We discussed continued close observation with additional diagnostic studies to include a followup thyroid ultrasound in early November 2013. Patient is currently transitioning from her prior thyroid medication to Synthroid.  She has followup laboratory studies scheduled in the near future.  Given the long-standing history of this nodule, a presence of thyroiditis, and the relatively benign cytopathology description given from the outside laboratory, I feel this is likely a low risk lesion. I will see her back in November 2013. We will have an ultrasound performed at our radiology practice here in [place]. The patient and I will then make a decision regarding continued observation versus surgical resection for definitive diagnosis.  Velora Heckler, MD, FACS General & Endocrine Surgery [facility]   Visit Diagnoses: 1. Neoplasm of uncertain behavior of thyroid gland, 1 cm right lobe   2. Hypothyroidism     Primary Care Physician: Darnelle Bos, MD

## 2011-12-04 ENCOUNTER — Encounter (INDEPENDENT_AMBULATORY_CARE_PROVIDER_SITE_OTHER): Payer: Self-pay

## 2011-12-31 ENCOUNTER — Ambulatory Visit: Payer: Self-pay | Admitting: Pain Medicine

## 2011-12-31 ENCOUNTER — Other Ambulatory Visit: Payer: Self-pay | Admitting: Pain Medicine

## 2012-01-26 ENCOUNTER — Ambulatory Visit: Payer: Self-pay | Admitting: Pain Medicine

## 2012-02-25 ENCOUNTER — Ambulatory Visit: Payer: Self-pay | Admitting: Pain Medicine

## 2012-02-26 ENCOUNTER — Ambulatory Visit
Admission: RE | Admit: 2012-02-26 | Discharge: 2012-02-26 | Disposition: A | Discharge status: Home / Self Care | Payer: 59 | Source: Ambulatory Visit | Attending: Surgery | Admitting: Surgery

## 2012-02-26 DIAGNOSIS — D440 Neoplasm of uncertain behavior of thyroid gland: Secondary | ICD-10-CM

## 2012-02-26 DIAGNOSIS — E039 Hypothyroidism, unspecified: Secondary | ICD-10-CM

## 2012-03-02 ENCOUNTER — Telehealth (INDEPENDENT_AMBULATORY_CARE_PROVIDER_SITE_OTHER): Payer: Self-pay

## 2012-03-02 NOTE — Telephone Encounter (Signed)
Pt advised u/s result needs to be reviewed by Dr Gerrit Friends before I can release result. Pt advised I will send msg to Dr Gerrit Friends to review the report and make recommendation on f/u or testing.

## 2012-03-12 ENCOUNTER — Telehealth (INDEPENDENT_AMBULATORY_CARE_PROVIDER_SITE_OTHER): Payer: Self-pay

## 2012-03-12 ENCOUNTER — Other Ambulatory Visit (INDEPENDENT_AMBULATORY_CARE_PROVIDER_SITE_OTHER): Payer: Self-pay

## 2012-03-12 DIAGNOSIS — E041 Nontoxic single thyroid nodule: Secondary | ICD-10-CM

## 2012-03-12 NOTE — Telephone Encounter (Signed)
LMOM for pt to call. I need to advise pt that  Needs repeat bx done since are has enlarged. Awaiting call back.

## 2012-03-30 ENCOUNTER — Other Ambulatory Visit (HOSPITAL_COMMUNITY)
Admission: RE | Admit: 2012-03-30 | Discharge: 2012-03-30 | Disposition: A | Discharge status: Home / Self Care | Payer: 59 | Source: Ambulatory Visit | Attending: Interventional Radiology | Admitting: Interventional Radiology

## 2012-03-30 ENCOUNTER — Ambulatory Visit
Admission: RE | Admit: 2012-03-30 | Discharge: 2012-03-30 | Disposition: A | Discharge status: Home / Self Care | Payer: 59 | Source: Ambulatory Visit | Attending: Surgery | Admitting: Surgery

## 2012-03-30 DIAGNOSIS — E049 Nontoxic goiter, unspecified: Secondary | ICD-10-CM | POA: Insufficient documentation

## 2012-03-30 DIAGNOSIS — E041 Nontoxic single thyroid nodule: Secondary | ICD-10-CM

## 2012-03-31 NOTE — Progress Notes (Signed)
Quick Note:  Please contact patient and notify of benign pathology results.  Yariana Hoaglund M. Lea Walbert, MD, FACS [facility] Office: [phone]   ______ 

## 2012-04-05 ENCOUNTER — Encounter (INDEPENDENT_AMBULATORY_CARE_PROVIDER_SITE_OTHER): Payer: Self-pay | Admitting: Surgery

## 2012-04-05 ENCOUNTER — Ambulatory Visit (INDEPENDENT_AMBULATORY_CARE_PROVIDER_SITE_OTHER): Payer: 59 | Admitting: Surgery

## 2012-04-05 VITALS — BP 146/84 | HR 78 | Temp 97.5°F | Ht 63.0 in | Wt 257.4 lb

## 2012-04-05 DIAGNOSIS — D449 Neoplasm of uncertain behavior of unspecified endocrine gland: Secondary | ICD-10-CM

## 2012-04-05 DIAGNOSIS — D440 Neoplasm of uncertain behavior of thyroid gland: Secondary | ICD-10-CM

## 2012-04-05 DIAGNOSIS — E039 Hypothyroidism, unspecified: Secondary | ICD-10-CM

## 2012-04-05 NOTE — Patient Instructions (Signed)
Thyroid Diseases Your thyroid is a butterfly-shaped gland in your neck. It is located just above your collarbone. It is one of your endocrine glands, which make hormones. The thyroid helps set your metabolism. Metabolism is how your body gets energy from the foods you eat.  Millions of people have thyroid diseases. Women experience thyroid problems more often than men. In fact, overactive thyroid problems (hyperthyroidism) occur in 1% of all women. If you have a thyroid disease, your body may use energy more slowly or quickly than it should.  Thyroid problems also include an immune disease where your body reacts against your thyroid gland (called thyroiditis). A different problem involves lumps and bumps (called nodules) that develop in the gland. The nodules are usually, but not always, noncancerous. THE MOST COMMON THYROID PROBLEMS AND CAUSES ARE DISCUSSED BELOW There are many causes for thyroid problems. Treatment depends upon the exact diagnosis and includes trying to reset your body's metabolism to a normal rate. Hyperthyroidism Too much thyroid hormone from an overactive thyroid gland is called hyperthyroidism. In hyperthyroidism, the body's metabolism speeds up. One of the most frequent forms of hyperthyroidism is known as Graves' disease. Graves' disease tends to run in families. Although Graves' is thought to be caused by a problem with the immune system, the exact nature of the genetic problem is unknown. Hypothyroidism Too little thyroid hormone from an underactive thyroid gland is called hypothyroidism. In hypothyroidism, the body's metabolism is slowed. Several things can cause this condition. Most causes affect the thyroid gland directly and hurt its ability to make enough hormone.  Rarely, there may be a pituitary gland tumor (located near the base of the brain). The tumor can block the pituitary from producing thyroid-stimulating hormone (TSH). Your body makes TSH to stimulate the thyroid  to work properly. If the pituitary does not make enough TSH, the thyroid fails to make enough hormones needed for good health. Whether the problem is caused by thyroid conditions or by the pituitary gland, the result is that the thyroid is not making enough hormones. Hypothyroidism causes many physical and mental processes to become sluggish. The body consumes less oxygen and produces less body heat. Thyroid Nodules A thyroid nodule is a small swelling or lump in the thyroid gland. They are common. These nodules represent either a growth of thyroid tissue or a fluid-filled cyst. Both form a lump in the thyroid gland. Almost half of all people will have tiny thyroid nodules at some point in their lives. Typically, these are not noticeable until they become large and affect normal thyroid size. Larger nodules that are greater than a half inch across (about 1 centimeter) occur in about 5 percent of people. Although most nodules are not cancerous, people who have them should seek medical care to rule out cancer. Also, some thyroid nodules may produce too much thyroid hormone or become too large. Large nodules or a large gland can interfere with breathing or swallowing or may cause neck discomfort. Other problems Other thyroid problems include cancer and thyroiditis. Thyroiditis is a malfunction of the body's immune system. Normally, the immune system works to defend the body against infection and other problems. When the immune system is not working properly, it may mistakenly attack normal cells, tissues, and organs. Examples of autoimmune diseases are Hashimoto's thyroiditis (which causes low thyroid function) and Graves' disease (which causes excess thyroid function). SYMPTOMS  Symptoms vary greatly depending upon the exact type of problem with the thyroid. Hyperthyroidism-is when your thyroid is too   active and makes more thyroid hormone than your body needs. The most common cause is Graves' Disease. Too  much thyroid hormone can cause some or all of the following symptoms:  Anxiety.  Irritability.  Difficulty sleeping.  Fatigue.  A rapid or irregular heartbeat.  A fine tremor of your hands or fingers.  An increase in perspiration.  Sensitivity to heat.  Weight loss, despite normal food intake.  Brittle hair.  Enlargement of your thyroid gland (goiter).  Light menstrual periods.  Frequent bowel movements. Graves' disease can specifically cause eye and skin problems. The skin problems involve reddening and swelling of the skin, often on your shins and on the top of your feet. Eye problems can include the following:  Excess tearing and sensation of grit or sand in either or both eyes.  Reddened or inflamed eyes.  Widening of the space between your eyelids.  Swelling of the lids and tissues around the eyes.  Light sensitivity.  Ulcers on the cornea.  Double vision.  Limited eye movements.  Blurred or reduced vision. Hypothyroidism- is when your thyroid gland is not active enough. This is more common than hyperthyroidism. Symptoms can vary a lot depending of the severity of the hormone deficiency. Symptoms may develop over a long period of time and can include several of the following:  Fatigue.  Sluggishness.  Increased sensitivity to cold.  Constipation.  Pale, dry skin.  A puffy face.  Hoarse voice.  High blood cholesterol level.  Unexplained weight gain.  Muscle aches, tenderness and stiffness.  Pain, stiffness or swelling in your joints.  Muscle weakness.  Heavier than normal menstrual periods.  Brittle fingernails and hair.  Depression. Thyroid Nodules - most do not cause signs or symptoms. Occasionally, some may become so large that you can feel or even see the swelling at the base of your neck. You may realize a lump or swelling is there when you are shaving or putting on makeup. Men might become aware of a nodule when shirt collars  suddenly feel too tight. Some nodules produce too much thyroid hormone. This can produce the same symptoms as hyperthyroidism (see above). Thyroid nodules are seldom cancerous. However, a nodule is more likely to be malignant (cancerous) if it:  Grows quickly or feels hard.  Causes you to become hoarse or to have trouble swallowing or breathing.  Causes enlarged lymph nodes under your jaw or in your neck. DIAGNOSIS  Because there are so many possible thyroid conditions, your caregiver may ask for a number of tests. They will do this in order to narrow down the exact diagnosis. These tests can include:  Blood and antibody tests.  Special thyroid scans using small, safe amounts of radioactive iodine.  Ultrasound of the thyroid gland (particularly if there is a nodule or lump).  Biopsy. This is usually done with a special needle. A needle biopsy is a procedure to obtain a sample of cells from the thyroid. The tissue will be tested in a lab and examined under a microscope. TREATMENT  Treatment depends on the exact diagnosis. Hyperthyroidism  Beta-blockers help relieve many of the symptoms.  Anti-thyroid medications prevent the thyroid from making excess hormones.  Radioactive iodine treatment can destroy overactive thyroid cells. The iodine can permanently decrease the amount of hormone produced.  Surgery to remove the thyroid gland.  Treatments for eye problems that come from Graves' disease also include medications and special eye surgery, if felt to be appropriate. Hypothyroidism Thyroid replacement with levothyroxine is   the mainstay of treatment. Treatment with thyroid replacement is usually lifelong and will require monitoring and adjustment from time to time. Thyroid Nodules  Watchful waiting. If a small nodule causes no symptoms or signs of cancer on biopsy, then no treatment may be chosen at first. Re-exam and re-checking blood tests would be the recommended  follow-up.  Anti-thyroid medications or radioactive iodine treatment may be recommended if the nodules produce too much thyroid hormone (see Treatment for Hyperthyroidism above).  Alcohol ablation. Injections of small amounts of ethyl alcohol (ethanol) can cause a non-cancerous nodule to shrink in size.  Surgery (see Treatment for Hyperthyroidism above). HOME CARE INSTRUCTIONS   Take medications as instructed.  Follow through on recommended testing. SEEK MEDICAL CARE IF:   You feel that you are developing symptoms of Hyperthyroidism or Hypothyroidism as described above.  You develop a new lump/nodule in the neck/thyroid area that you had not noticed before.  You feel that you are having side effects from medicines prescribed.  You develop trouble breathing or swallowing. SEEK IMMEDIATE MEDICAL CARE IF:   You develop a fever of 102 F (38.9 C) or higher.  You develop severe sweating.  You develop palpitations and/or rapid heart beat.  You develop shortness of breath.  You develop nausea and vomiting.  You develop extreme shakiness.  You develop agitation.  You develop lightheadedness or have a fainting episode. Document Released: 02/02/2007 Document Revised: 06/30/2011 Document Reviewed: 02/02/2007 [employer] Patient Information 2013 [employer].  

## 2012-04-05 NOTE — Progress Notes (Signed)
General Surgery [facility]  Visit Diagnoses: 1. Neoplasm of uncertain behavior of thyroid gland, 1 cm right lobe   2. 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 Short interval followup ultrasound showed an increase in size from 1.1 cm to 2.0 cm. Ultrasound-guided fine-needle aspiration biopsy showed a follicular lesion but no evidence of atypia or malignancy. She returns today for scheduled followup examination and review of ultrasound results.  Patient has been switched by her endocrinologist from Armour thyroid to Synthroid 150 mcg daily.  PERTINENT REVIEW OF SYSTEMS: Denies tremors. Denies palpitations. Denies compressive symptoms. Denies pain.  EXAM: HEENT: normocephalic; dentition good; mucous membranes moist NECK:  Palpable nodule in the right lobe, approximately 2 cm, mobile, nontender; small area of ecchymosis right of midline just above the thyroid notch; symmetric on extension; no palpable anterior or posterior cervical lymphadenopathy; no supraclavicular masses; no tenderness CHEST: clear to auscultation bilaterally without rales, rhonchi, or wheezes CARDIAC: regular rate and rhythm without significant murmur; peripheral pulses are full EXT:  non-tender without edema; no deformity NEURO: no gross focal deficits; no sign of tremor   IMPRESSION: #1 right thyroid nodule, 2.0 cm, with benign cytopathology #2 history of hypothyroidism, likely Hashimoto's thyroiditis  PLAN: The patient and I discussed options for further management. At this point I would like to do a short interval follow-up ultrasound in 6 months. If there is continued enlargement of the right-sided nodule I think we should consider right thyroid lobectomy for definitive diagnosis. If the nodule is stable on her new dose of Synthroid, then I think it is safe to  continue close observation.  Patient will return in 6 months for repeat thyroid examination and review of ultrasound results.  Velora Heckler, MD, [facility] Office: [phone]

## 2012-04-09 ENCOUNTER — Ambulatory Visit: Payer: Self-pay | Admitting: Pain Medicine

## 2012-07-19 ENCOUNTER — Ambulatory Visit: Payer: Self-pay | Admitting: Pain Medicine

## 2012-08-19 ENCOUNTER — Other Ambulatory Visit: Payer: Self-pay | Admitting: Pain Medicine

## 2012-10-04 ENCOUNTER — Ambulatory Visit
Admission: RE | Admit: 2012-10-04 | Discharge: 2012-10-04 | Disposition: A | Discharge status: Home / Self Care | Payer: 59 | Source: Ambulatory Visit | Attending: Surgery | Admitting: Surgery

## 2012-10-04 DIAGNOSIS — D440 Neoplasm of uncertain behavior of thyroid gland: Secondary | ICD-10-CM

## 2012-10-04 DIAGNOSIS — E039 Hypothyroidism, unspecified: Secondary | ICD-10-CM

## 2012-10-19 ENCOUNTER — Ambulatory Visit (INDEPENDENT_AMBULATORY_CARE_PROVIDER_SITE_OTHER): Payer: 59 | Admitting: Surgery

## 2012-10-19 ENCOUNTER — Encounter (INDEPENDENT_AMBULATORY_CARE_PROVIDER_SITE_OTHER): Payer: Self-pay | Admitting: Surgery

## 2012-10-19 VITALS — BP 132/86 | HR 84 | Temp 98.4°F | Resp 18 | Ht 63.0 in | Wt 265.6 lb

## 2012-10-19 DIAGNOSIS — D440 Neoplasm of uncertain behavior of thyroid gland: Secondary | ICD-10-CM

## 2012-10-19 DIAGNOSIS — E039 Hypothyroidism, unspecified: Secondary | ICD-10-CM

## 2012-10-19 DIAGNOSIS — D449 Neoplasm of uncertain behavior of unspecified endocrine gland: Secondary | ICD-10-CM

## 2012-10-19 NOTE — Progress Notes (Signed)
General Surgery [facility]  Visit Diagnoses: 1. Neoplasm of uncertain behavior of thyroid gland, 2 cm right lobe   2. 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 She had a short interval followup ultrasound at my request. This was performed on 10/04/2012. This shows a slightly enlarged thyroid gland with the right lobe measuring 5.0 cm and left lobe measuring 5.2 cm. There is a dominant nodule in the mid right thyroid lobe measuring 1.8 cm in size. It is unchanged from her prior study. On the left there is a 6 mm coarsely calcified nodule which is also unchanged from her prior study.  She is currently taking Synthroid 150 mcg daily under the direction of her endocrinologist.  PERTINENT REVIEW OF SYSTEMS: Patient denies any change in self-examination. She denies any new symptoms. She denies tremor. She denies palpitations. She denies any compressive symptoms.  EXAM: HEENT: normocephalic; pupils equal and reactive; sclerae clear; dentition fair; mucous membranes moist NECK:  Thyroid gland is slightly firm and slightly nodular without discrete or dominant mass; symmetric on extension; no palpable anterior or posterior cervical lymphadenopathy; no supraclavicular masses; no tenderness CHEST: clear to auscultation bilaterally without rales, rhonchi, or wheezes CARDIAC: regular rate and rhythm without significant murmur; peripheral pulses are full EXT:  non-tender without edema; no deformity NEURO: no gross focal deficits; no sign of tremor   IMPRESSION: Small thyroid goiter with bilateral thyroid nodules, clinically stable Hypothyroidism, likely secondary to Hashimoto's thyroiditis  PLAN: Patient and I reviewed her studies at length. There is no indication for surgical intervention at this point. I've asked her to undergo a repeat ultrasound in one year. She will return following that  study for physical examination.  Patient will see her endocrinologist in the fall. He will continue to monitor her thyroid hormone levels and adjust her replacement dosage as needed.  Velora Heckler, MD, [facility] Office: [phone]

## 2012-10-19 NOTE — Patient Instructions (Signed)

## 2012-11-12 ENCOUNTER — Ambulatory Visit: Payer: Self-pay | Admitting: Pain Medicine

## 2013-01-17 ENCOUNTER — Ambulatory Visit: Payer: Self-pay | Admitting: Pain Medicine

## 2013-02-17 ENCOUNTER — Ambulatory Visit: Payer: Self-pay | Admitting: Pain Medicine

## 2013-06-10 ENCOUNTER — Other Ambulatory Visit: Payer: Self-pay | Admitting: Pain Medicine

## 2013-06-10 ENCOUNTER — Ambulatory Visit: Payer: Self-pay | Admitting: Pain Medicine

## 2013-06-10 LAB — COMPREHENSIVE METABOLIC PANEL
ALK PHOS: 88 U/L
ANION GAP: 5 — AB (ref 7–16)
Albumin: 3.7 g/dL (ref 3.4–5.0)
BILIRUBIN TOTAL: 0.7 mg/dL (ref 0.2–1.0)
BUN: 11 mg/dL (ref 7–18)
CALCIUM: 8.9 mg/dL (ref 8.5–10.1)
CHLORIDE: 103 mmol/L (ref 98–107)
CREATININE: 0.84 mg/dL (ref 0.60–1.30)
Co2: 30 mmol/L (ref 21–32)
EGFR (Non-African Amer.): >60
GLUCOSE: 78 mg/dL (ref 65–99)
Osmolality: 274 (ref 275–301)
POTASSIUM: 3.3 mmol/L — AB (ref 3.5–5.1)
SGOT(AST): 29 U/L (ref 15–37)
SGPT (ALT): 40 U/L (ref 12–78)
Sodium: 138 mmol/L (ref 136–145)
Total Protein: 7.6 g/dL (ref 6.4–8.2)

## 2013-06-10 LAB — MAGNESIUM: MAGNESIUM: 1.7 mg/dL — AB

## 2013-06-10 LAB — BILIRUBIN, DIRECT: Bilirubin, Direct: 0.2 mg/dL (ref 0.00–0.20)

## 2013-07-19 ENCOUNTER — Telehealth (INDEPENDENT_AMBULATORY_CARE_PROVIDER_SITE_OTHER): Payer: Self-pay | Admitting: *Deleted

## 2013-07-19 NOTE — Telephone Encounter (Signed)
Pt called and asked if she needed to have a Ultra Sound before she returns to see you in July. Please advise pt.  Alison Cox

## 2013-07-20 ENCOUNTER — Encounter: Payer: Self-pay | Admitting: Internal Medicine

## 2013-07-20 ENCOUNTER — Other Ambulatory Visit (INDEPENDENT_AMBULATORY_CARE_PROVIDER_SITE_OTHER): Payer: Self-pay

## 2013-07-20 ENCOUNTER — Telehealth (INDEPENDENT_AMBULATORY_CARE_PROVIDER_SITE_OTHER): Payer: Self-pay

## 2013-07-20 DIAGNOSIS — E042 Nontoxic multinodular goiter: Secondary | ICD-10-CM

## 2013-07-20 NOTE — Telephone Encounter (Signed)
Patient should have ultrasound exam before her office visit in July at [place].  Earnstine Regal, MD, [facility] Office: [phone]

## 2013-07-20 NOTE — Telephone Encounter (Signed)
Pt left msg wanting to know if she needs thyroid ultrasound prior to July appt. LMOM for pt to call back. I have place order in epic for ultrasound to be done in July at [facility] imaging. Pt can call [phone] to set up date prior to her ov.

## 2013-07-25 NOTE — Telephone Encounter (Signed)
Pt set up her u/s for 09-27-13. Copy to jennifer to pre cert

## 2013-09-09 ENCOUNTER — Ambulatory Visit: Payer: Self-pay | Admitting: Pain Medicine

## 2013-09-27 ENCOUNTER — Ambulatory Visit
Admission: RE | Admit: 2013-09-27 | Discharge: 2013-09-27 | Disposition: A | Discharge status: Home / Self Care | Payer: 59 | Source: Ambulatory Visit | Attending: Surgery | Admitting: Surgery

## 2013-09-27 DIAGNOSIS — E042 Nontoxic multinodular goiter: Secondary | ICD-10-CM

## 2013-11-15 ENCOUNTER — Encounter (INDEPENDENT_AMBULATORY_CARE_PROVIDER_SITE_OTHER): Payer: Self-pay | Admitting: Surgery

## 2013-11-15 ENCOUNTER — Ambulatory Visit (INDEPENDENT_AMBULATORY_CARE_PROVIDER_SITE_OTHER): Payer: 59 | Admitting: Surgery

## 2013-11-15 VITALS — BP 128/76 | HR 92 | Temp 98.0°F | Ht 63.0 in | Wt 268.0 lb

## 2013-11-15 DIAGNOSIS — E039 Hypothyroidism, unspecified: Secondary | ICD-10-CM

## 2013-11-15 DIAGNOSIS — E042 Nontoxic multinodular goiter: Secondary | ICD-10-CM

## 2013-11-15 NOTE — Progress Notes (Signed)
General Surgery [facility]  Chief Complaint  Alison Cox presents with  . 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 Alison Cox was last seen in 2013. At my request she underwent a thyroid ultrasound examination on 09/27/2013. This was compared to prior studies. Right thyroid lobe measures 5.1 x 1.9 x 1.7 cm. Dominant nodule in the right lobe measures 1.7 x 1.0 x 1.2 cm. It has a peripheral hypoechoic halo. It is decreased in size from prior studies. No new nodules are identified. Left thyroid lobe measures 5.4 x 2.0 x 1.4 cm. There is a 4 mm dystrophic calcification which is also slightly decreased in size. No new nodules were identified on the left.  Alison Cox remains on Synthroid 150 mcg under the direction of her endocrinologist.  PERTINENT REVIEW OF SYSTEMS: Denies tremor. Denies palpitation. Denies compressive symptoms. Denies neck discomfort. Denies new palpable mass.  EXAM: HEENT: normocephalic; pupils equal and reactive; sclerae clear; dentition good; mucous membranes moist NECK:  Thyroid is soft without discrete or dominant mass on palpation; symmetric on extension; no palpable anterior or posterior cervical lymphadenopathy; no supraclavicular masses; no tenderness CHEST: clear to auscultation bilaterally without rales, rhonchi, or wheezes CARDIAC: regular rate and rhythm without significant murmur; peripheral pulses are full EXT:  non-tender without edema; no deformity NEURO: no gross focal deficits; no sign of tremor   IMPRESSION: Bilateral thyroid nodules, clinically stable  PLAN: The Alison Cox and I reviewed the ultrasound report in detail. There is no indication for surgical excision at this point in time. I would like to repeat her ultrasound in 2 years. I will see her for physical examination following that study.  Alison Cox is scheduled to see her endocrinologist in October  2015.  Earnstine Regal, MD, [facility] Office: [phone]  Visit Diagnoses: 1. Multiple thyroid nodules   2. Hypothyroidism, unspecified hypothyroidism type

## 2013-11-15 NOTE — Patient Instructions (Signed)
Thyroid-Stimulating Hormone A thyroid-stimulating hormone (TSH) test is a blood test that is done to measure the level of TSH, also known as thyrotropin, in your blood. Knowing the level of TSH in your blood can help your health care provider:  Diagnose a thyroid gland or pituitary gland disorder.  Manage your condition and treatment if you have hypothyroidism or hyperthyroidism. TSH is produced by the pituitary gland. The pituitary gland is a small organ located just below the brain, behind your eyes and nasal passages. It is part of a system that monitors and maintains thyroid hormone levels and thyroid gland function. Thyroid hormones affect many body parts and systems, including the system that affects how quickly your body burns fuel for energy. Your health care provider may recommend testing your TSH level if you have signs and symptoms that are often associated with abnormal thyroid hormone levels. The blood used for testing is obtained through a needle placed in a vein in your arm. RESULTS It is your responsibility to obtain your test results. Ask the lab or department performing the test when and how you will get your results. Contact your health care provider to discuss any questions you have about your results.  Your health care provider will go over the test results with you and discuss the importance and meaning of your results, as well as the need for additional tests if necessary. Range of Normal Values  Ranges for normal values may vary among different labs and hospitals. You should always check with your health care provider after having lab work or other tests done to discuss whether your values are considered within normal limits.  Adult: 0.3-5 microU/mL or 0.3-5 mU/L (SI units).  Newborn: 3-18 microU/mL or 3-18 mU/L.  Cord: 3-12 microU/mL or 3-12 mU/L. Meaning of Results Outside Normal Value Ranges A high level of TSH may mean:  Your thyroid gland is not making enough  thyroid hormones. When the thyroid gland does not make enough thyroid hormones, the pituitary gland releases TSH into the bloodstream. The higher-than-normal levels of TSH prompt the thyroid gland to release more thyroid hormones.  You are getting an insufficient level of thyroid hormone medicine, if you are receiving this type of treatment.  There is a problem with the pituitary gland (rare). A low level of TSH can indicate a problem with the pituitary gland. Document Released: 05/02/2004 Document Revised: 08/22/2013 Document Reviewed: 03/19/2008 ExitCare Patient Information 2015 [employer]. This information is not intended to replace advice given to you by your health care provider. Make sure you discuss any questions you have with your health care provider.  

## 2013-12-01 ENCOUNTER — Other Ambulatory Visit: Payer: Self-pay | Admitting: Nurse Practitioner

## 2013-12-01 ENCOUNTER — Ambulatory Visit
Admission: RE | Admit: 2013-12-01 | Discharge: 2013-12-01 | Disposition: A | Discharge status: Home / Self Care | Payer: 59 | Source: Ambulatory Visit | Attending: Nurse Practitioner | Admitting: Nurse Practitioner

## 2013-12-01 DIAGNOSIS — J069 Acute upper respiratory infection, unspecified: Secondary | ICD-10-CM

## 2013-12-16 ENCOUNTER — Ambulatory Visit: Payer: Self-pay | Admitting: Pain Medicine

## 2014-03-09 ENCOUNTER — Ambulatory Visit: Payer: Self-pay | Admitting: Pain Medicine

## 2014-03-30 DIAGNOSIS — I10 Essential (primary) hypertension: Secondary | ICD-10-CM | POA: Insufficient documentation

## 2014-05-10 DIAGNOSIS — Z947 Corneal transplant status: Secondary | ICD-10-CM | POA: Insufficient documentation

## 2014-05-10 DIAGNOSIS — Z961 Presence of intraocular lens: Secondary | ICD-10-CM | POA: Insufficient documentation

## 2014-07-05 IMAGING — US US SOFT TISSUE HEAD/NECK
1 series · 13 of 25 positions shown · non-contrast
Comparison: Prior thyroid ultrasound 10/04/2012 and 02/26/2012

CLINICAL DATA: Follow-up thyroid nodules

EXAM:
THYROID ULTRASOUND
TECHNIQUE: Ultrasound examination of the thyroid gland and adjacent soft
tissues was performed.

[Series 1: us soft tissue head/neck · 0.08mm/px · 13 of 41 slices shown]
[im 1/41]
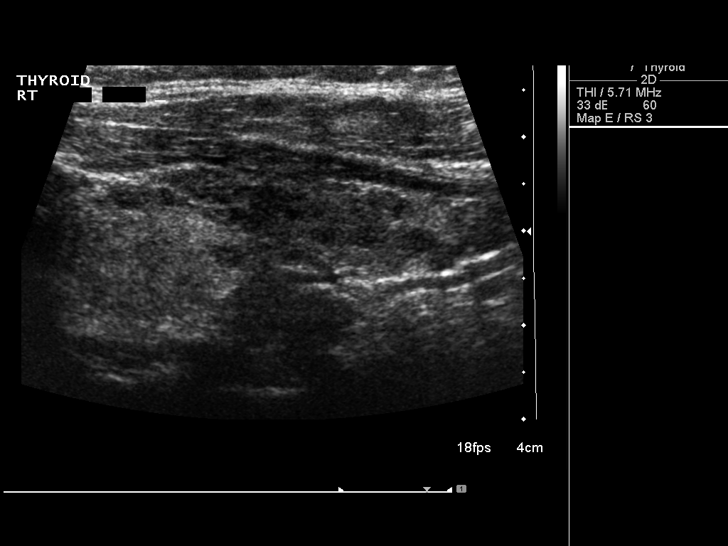
[im 4/41]
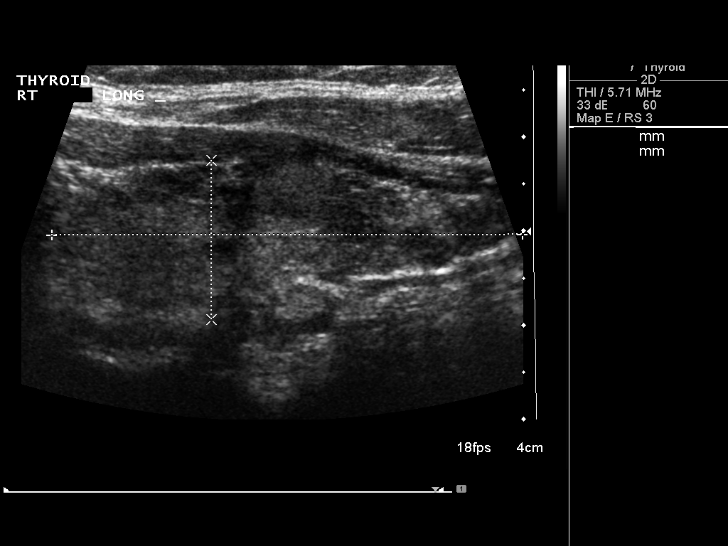
[im 7/41]
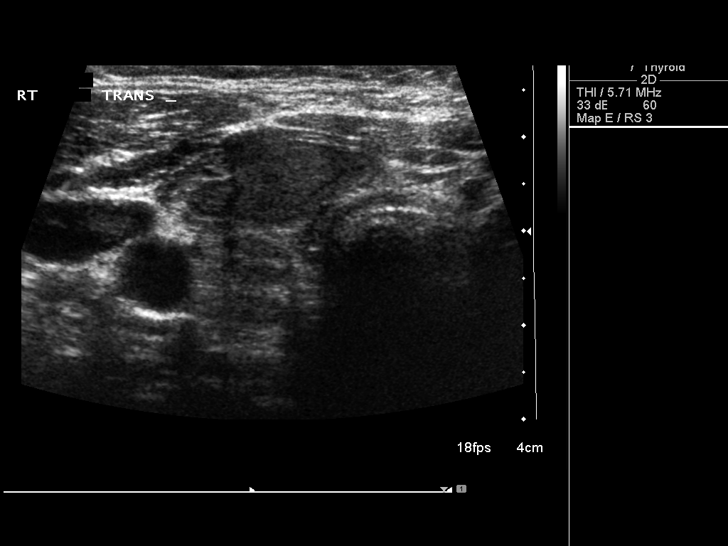
[im 11/41]
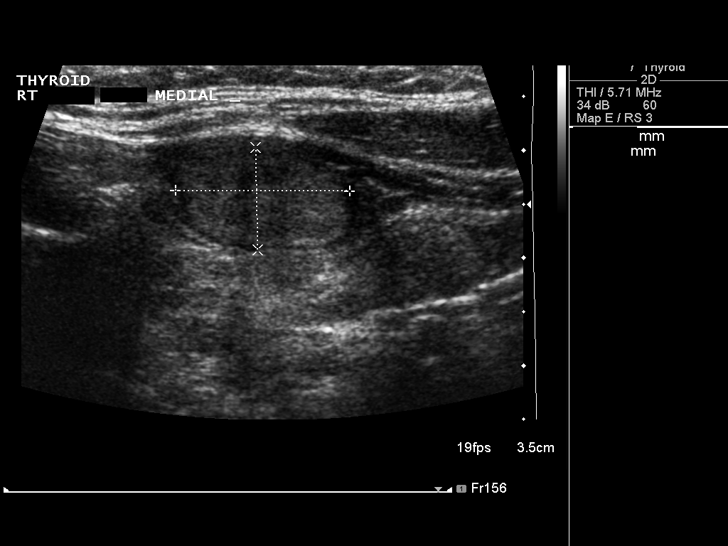
[im 14/41]
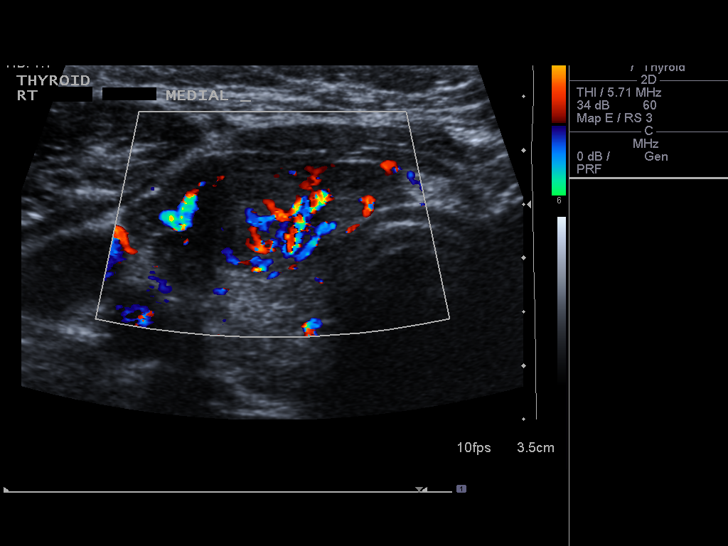
[im 17/41]
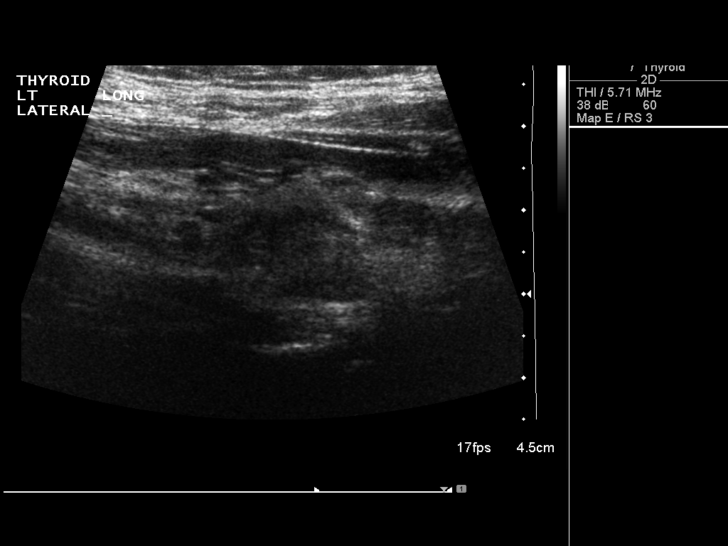
[im 21/41]
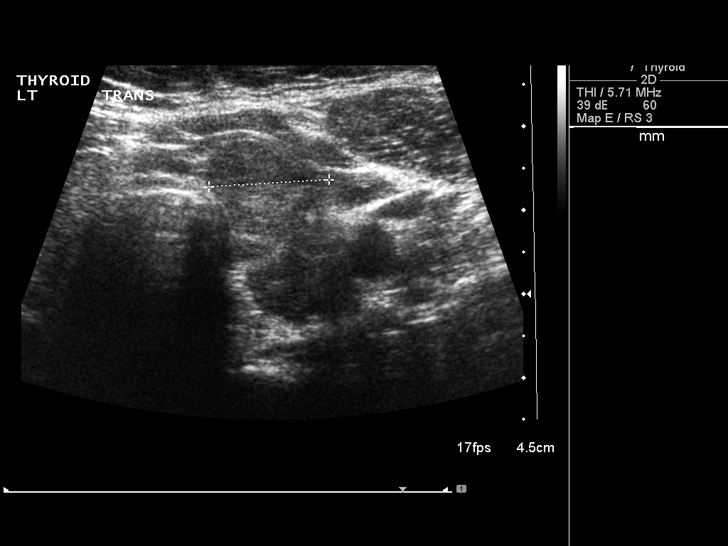
[im 24/41]
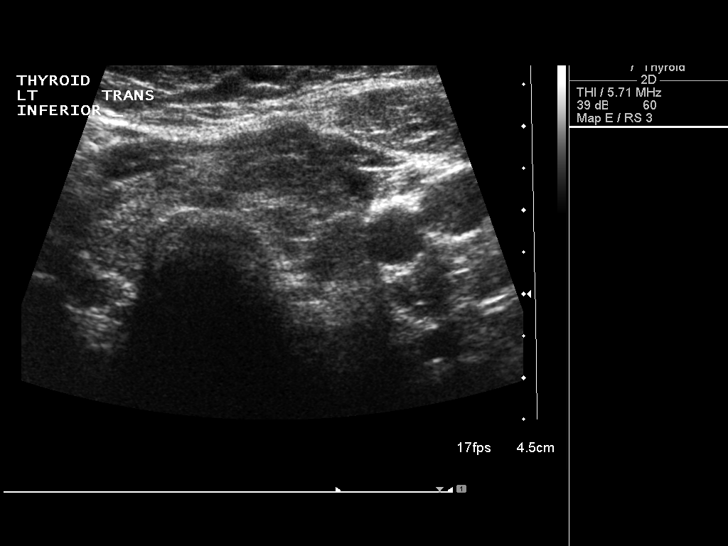
[im 27/41]
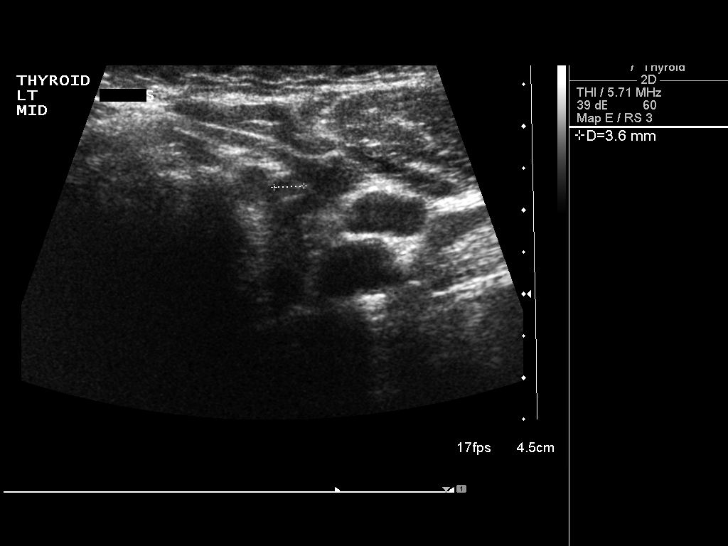
[im 31/41]
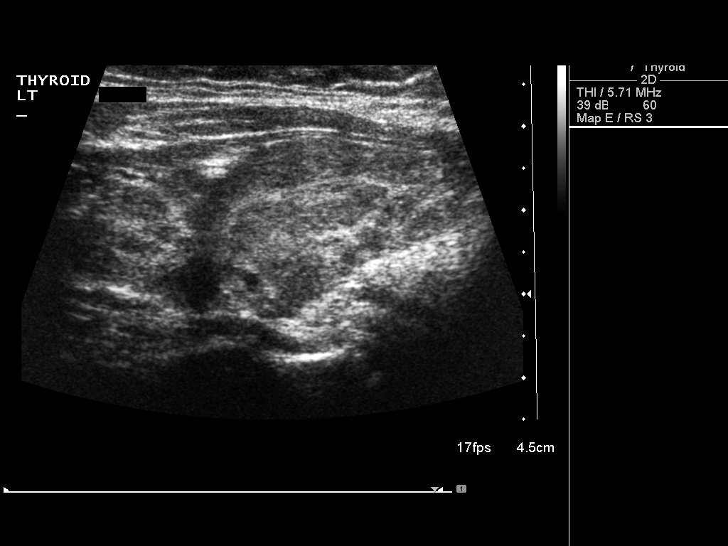
[im 34/41]
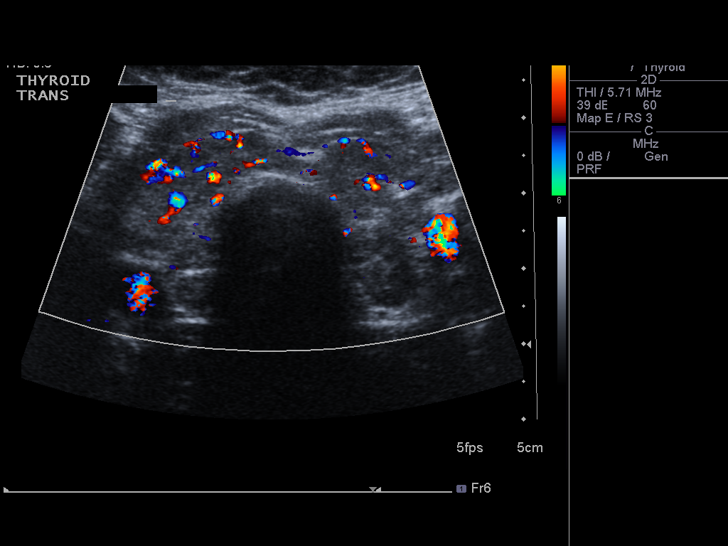
[im 37/41]
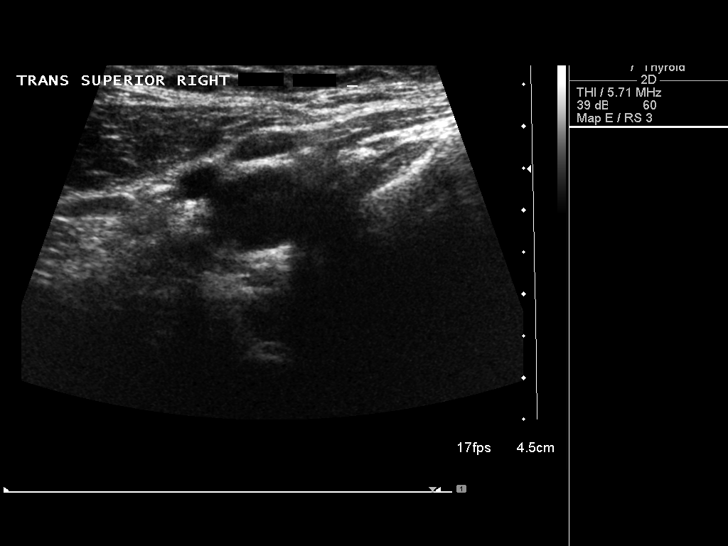
[im 41/41]
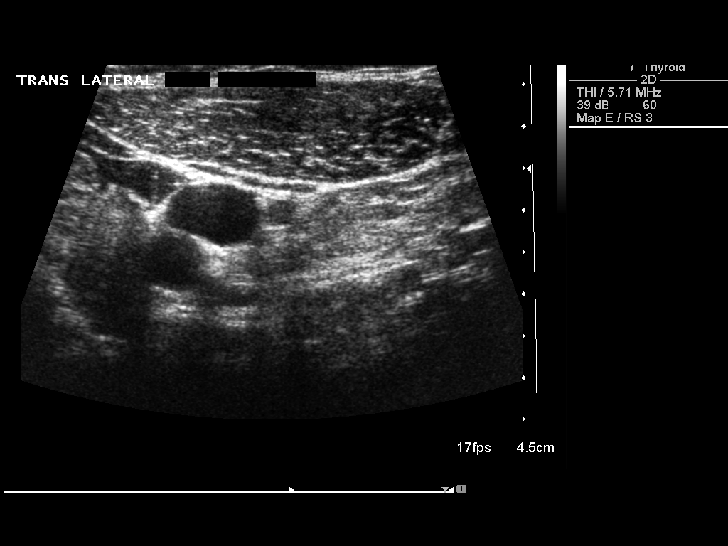

[13 of 25 positions shown; findings below may reference images not displayed]

FINDINGS: Right thyroid lobe

Measurements: 5.1 x 1.9 x 1.7 cm (previously 5.0 x 2.2 x 1.8 cm).
Continued stability of the 1.7 x 1.0 x 1.2 cm solid isoechoic nodule
with a peripheral hypoechoic halo in the mid aspect of the right
gland abutting the isthmus. The nodule was previously measured at
1.8 x 1.0 x 1.7 cm and may have in fact slightly decreased in size
since the prior examination. The background thyroid parenchyma is
heterogeneous.

Left thyroid lobe

Measurements: 5.4 x 2.0 x 1.4 cm (previously 5.2 x 2.1 x 1.3 cm).
Tiny 0.4 x 0.3 x 0.4 cm dystrophic calcification within the
periphery of the left thyroid gland also remains stable. This was
previously measured at 0.5 x 0.6 x 0.5 mm. Diffusely heterogeneous
thyroid parenchyma. No other discrete nodule.

Isthmus

Thickness: 0.6 cm.  No nodules visualized.

Lymphadenopathy

None visualized.
IMPRESSION: 1. Stable to slightly decreased size of thyroid nodules.
2. No new thyroid nodule identified.
3. Diffusely heterogeneous background thyroid parenchyma.

## 2014-07-26 ENCOUNTER — Encounter: Payer: Self-pay | Admitting: *Deleted

## 2014-12-26 ENCOUNTER — Other Ambulatory Visit: Payer: Self-pay | Admitting: Obstetrics and Gynecology

## 2014-12-27 LAB — CYTOLOGY - PAP

## 2015-01-01 ENCOUNTER — Other Ambulatory Visit: Payer: Self-pay | Admitting: Obstetrics and Gynecology

## 2015-01-01 DIAGNOSIS — R928 Other abnormal and inconclusive findings on diagnostic imaging of breast: Secondary | ICD-10-CM

## 2015-01-04 ENCOUNTER — Ambulatory Visit
Admission: RE | Admit: 2015-01-04 | Discharge: 2015-01-04 | Disposition: A | Discharge status: Home / Self Care | Payer: 59 | Source: Ambulatory Visit | Attending: Obstetrics and Gynecology | Admitting: Obstetrics and Gynecology

## 2015-01-04 DIAGNOSIS — R928 Other abnormal and inconclusive findings on diagnostic imaging of breast: Secondary | ICD-10-CM

## 2015-02-28 ENCOUNTER — Encounter: Payer: Self-pay | Admitting: Pain Medicine

## 2015-03-06 ENCOUNTER — Other Ambulatory Visit: Payer: Self-pay | Admitting: Pain Medicine

## 2015-03-06 ENCOUNTER — Ambulatory Visit: Payer: 59 | Attending: Pain Medicine | Admitting: Pain Medicine

## 2015-03-06 ENCOUNTER — Encounter: Payer: Self-pay | Admitting: Pain Medicine

## 2015-03-06 VITALS — BP 161/74 | HR 87 | Temp 97.9°F | Resp 20 | Ht 63.0 in | Wt 270.0 lb

## 2015-03-06 DIAGNOSIS — M170 Bilateral primary osteoarthritis of knee: Secondary | ICD-10-CM | POA: Diagnosis not present

## 2015-03-06 DIAGNOSIS — M1990 Unspecified osteoarthritis, unspecified site: Secondary | ICD-10-CM | POA: Diagnosis not present

## 2015-03-06 DIAGNOSIS — M4316 Spondylolisthesis, lumbar region: Secondary | ICD-10-CM | POA: Diagnosis not present

## 2015-03-06 DIAGNOSIS — E042 Nontoxic multinodular goiter: Secondary | ICD-10-CM | POA: Diagnosis not present

## 2015-03-06 DIAGNOSIS — E559 Vitamin D deficiency, unspecified: Secondary | ICD-10-CM | POA: Diagnosis not present

## 2015-03-06 DIAGNOSIS — M47816 Spondylosis without myelopathy or radiculopathy, lumbar region: Secondary | ICD-10-CM | POA: Insufficient documentation

## 2015-03-06 DIAGNOSIS — Z79899 Other long term (current) drug therapy: Secondary | ICD-10-CM

## 2015-03-06 DIAGNOSIS — T402X5A Adverse effect of other opioids, initial encounter: Secondary | ICD-10-CM

## 2015-03-06 DIAGNOSIS — Z5181 Encounter for therapeutic drug level monitoring: Secondary | ICD-10-CM | POA: Insufficient documentation

## 2015-03-06 DIAGNOSIS — M4310 Spondylolisthesis, site unspecified: Secondary | ICD-10-CM

## 2015-03-06 DIAGNOSIS — G8929 Other chronic pain: Secondary | ICD-10-CM

## 2015-03-06 DIAGNOSIS — F1190 Opioid use, unspecified, uncomplicated: Secondary | ICD-10-CM | POA: Insufficient documentation

## 2015-03-06 DIAGNOSIS — M25569 Pain in unspecified knee: Secondary | ICD-10-CM | POA: Insufficient documentation

## 2015-03-06 DIAGNOSIS — E063 Autoimmune thyroiditis: Secondary | ICD-10-CM | POA: Insufficient documentation

## 2015-03-06 DIAGNOSIS — Z79891 Long term (current) use of opiate analgesic: Secondary | ICD-10-CM | POA: Insufficient documentation

## 2015-03-06 DIAGNOSIS — I10 Essential (primary) hypertension: Secondary | ICD-10-CM | POA: Insufficient documentation

## 2015-03-06 DIAGNOSIS — K5903 Drug induced constipation: Secondary | ICD-10-CM | POA: Insufficient documentation

## 2015-03-06 DIAGNOSIS — M4307 Spondylolysis, lumbosacral region: Secondary | ICD-10-CM | POA: Insufficient documentation

## 2015-03-06 DIAGNOSIS — Q762 Congenital spondylolisthesis: Secondary | ICD-10-CM

## 2015-03-06 DIAGNOSIS — F1120 Opioid dependence, uncomplicated: Secondary | ICD-10-CM

## 2015-03-06 DIAGNOSIS — Z87442 Personal history of urinary calculi: Secondary | ICD-10-CM

## 2015-03-06 DIAGNOSIS — M25561 Pain in right knee: Secondary | ICD-10-CM

## 2015-03-06 DIAGNOSIS — M549 Dorsalgia, unspecified: Secondary | ICD-10-CM | POA: Insufficient documentation

## 2015-03-06 DIAGNOSIS — Z8669 Personal history of other diseases of the nervous system and sense organs: Secondary | ICD-10-CM

## 2015-03-06 DIAGNOSIS — M25562 Pain in left knee: Secondary | ICD-10-CM

## 2015-03-06 DIAGNOSIS — M150 Primary generalized (osteo)arthritis: Secondary | ICD-10-CM

## 2015-03-06 DIAGNOSIS — Z8639 Personal history of other endocrine, nutritional and metabolic disease: Secondary | ICD-10-CM | POA: Insufficient documentation

## 2015-03-06 DIAGNOSIS — M159 Polyosteoarthritis, unspecified: Secondary | ICD-10-CM

## 2015-03-06 DIAGNOSIS — M4726 Other spondylosis with radiculopathy, lumbar region: Secondary | ICD-10-CM

## 2015-03-06 HISTORY — DX: Personal history of other diseases of the nervous system and sense organs: Z86.69

## 2015-03-06 HISTORY — DX: Personal history of other endocrine, nutritional and metabolic disease: Z86.39

## 2015-03-06 HISTORY — DX: Personal history of urinary calculi: Z87.442

## 2015-03-06 MED ORDER — HYDROCODONE-ACETAMINOPHEN 5-325 MG PO TABS: 1.0000 | ORAL_TABLET | Freq: Four times a day (QID) | ORAL | Status: DC | PRN

## 2015-03-06 MED ORDER — VITAMIN D3 50 MCG (2000 UT) PO CAPS: 2000.0000 [IU] | ORAL_CAPSULE | Freq: Every day | ORAL | Status: DC

## 2015-03-06 MED ORDER — MAGNESIUM OXIDE -MG SUPPLEMENT 500 MG PO CAPS: 1.0000 | ORAL_CAPSULE | Freq: Every day | ORAL | Status: DC

## 2015-03-06 MED ORDER — BENEFIBER PO POWD: ORAL | Status: DC

## 2015-03-06 NOTE — Progress Notes (Signed)
Safety precautions to be maintained throughout the outpatient stay will include: orient to surroundings, keep bed in low position, maintain call bell within reach at all times, provide assistance with transfer out of bed and ambulation.  

## 2015-03-06 NOTE — Progress Notes (Signed)
Patient's Name: Alison Cox MRN: [id number] DOB: 1955/10/03 DOS: 03/06/2015  Primary Reason(s) for Visit: Encounter for Medication Management. CC: Back Pain and Knee Pain   HPI:   Alison Cox is a 59 y.o. year old, female patient, who returns today as an established patient. 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; Chronic pain; Long term current use of opiate analgesic; Long term prescription opiate use; Opiate use; Encounter for therapeutic drug level monitoring; Opioid dependence ([place]); Lumbar spondylosis; Grade 1 Anterolisthesis of L4 over L5; Therapeutic opioid-induced constipation (OIC); Chronic bilateral knee pain (R>L); Osteoarthritis of both knees (R>L); Vitamin D insufficiency; History of Bell's palsy (left-sided); History of nephrolithiasis; History of Hashimoto thyroiditis; Osteoarthrosis; Obesity, Class III, BMI 40-49.9 (morbid obesity) (HCC) (254% higher incidence of chronic low back pain); and Spondylolysis of lumbosacral region (Advanced disc degeneration and spondylosis L5-S1) on her problem list.. Her primarily concern today is the Back Pain and Knee Pain     The patient comes into the clinic today for medication refill. She indicates that she is doing well on her current medication regimen and no changes are needed. Most of her pain continues to be in the right knee, more so than the left. She is also experiencing some minimal low back pain.  Today's Pain Score: 3  Reported level of pain is compatible with clinical observation Pain Type: Chronic pain Pain Descriptors / Indicators: Aching Pain Frequency: Intermittent  Date of Last Visit:   12/05/2014 Service Provided on Last Visit:   Medication management visit.  Pharmacotherapy Review:   Side-effects or Adverse reactions: None reported. Effectiveness: Described as relatively effective, allowing for increase in activities of daily living  (ADL). Onset of action: Within expected pharmacological parameters. Duration of action: Within normal limits for medication. Peak effect: Timing and results are as within normal expected parameters. [place] PMP: Compliant with practice rules and regulations. UDS: Compliant with practice rules and regulations. Lab work: No new labs ordered by our practice. Treatment compliance: Compliant. Substance Use Disorder (SUD) Risk Level: Low Planned course of action: Continue therapy as is.  Allergies: Alison Cox is allergic to penicillins; shellfish allergy; and sulfa antibiotics.  Meds: The patient has a current medication list which includes the following prescription(s): amlodipine-benazepril, doxycycline, estradiol-norethindrone, hydrochlorothiazide, levothyroxine, meloxicam, vitamin d3, hydrocodone-acetaminophen, hydrocodone-acetaminophen, hydrocodone-acetaminophen, magnesium oxide, and benefiber. Requested Prescriptions   Signed Prescriptions Disp Refills  . HYDROcodone-acetaminophen (NORCO) 5-325 MG tablet 120 tablet 0    Sig: Take 1 tablet by mouth every 6 (six) hours as needed for moderate pain or severe pain.  [name] Kitchen HYDROcodone-acetaminophen (NORCO) 5-325 MG tablet 120 tablet 0    Sig: Take 1 tablet by mouth every 6 (six) hours as needed for moderate pain or severe pain.  [name] Kitchen HYDROcodone-acetaminophen (NORCO) 5-325 MG tablet 120 tablet 0    Sig: Take 1 tablet by mouth every 6 (six) hours as needed for moderate pain or severe pain.  . Wheat Dextrin (BENEFIBER) POWD 500 g PRN    Sig: Stir 2 teaspoons of Benefiber into 4-8 oz of any non-carbonated beverage or soft food (hot or cold) TID.  . Magnesium Oxide 500 MG CAPS 100 capsule PRN    Sig: Take 1 capsule (500 mg total) by mouth daily.  . Cholecalciferol (VITAMIN D3) 2000 UNITS capsule 30 capsule PRN    Sig: Take 1 capsule (2,000 Units total) by mouth daily.    ROS: Constitutional: Afebrile, no chills, well hydrated and well  nourished  Gastrointestinal: negative Musculoskeletal:negative Neurological: negative Behavioral/Psych: negative  PFSH: Medical:  Alison Cox  has a past medical history of Hypertension; Thyroid disease; Osteoarthritis; Kidney stone; History of Bell's palsy (03/06/2015); History of nephrolithiasis (03/06/2015); and History of Hashimoto thyroiditis (03/06/2015). Family: family history includes Hypertension in her father and mother. Surgical:  has past surgical history that includes Endometrial ablation; DILATATION AND CURETTAGE; and Corneal transplant. Tobacco:  reports that she has never smoked. She does not have any smokeless tobacco history on file. Alcohol:  reports that she does not drink alcohol. Drug:  reports that she does not use illicit drugs.  Physical Exam: Vitals:  Today's Vitals   03/06/15 1459 03/06/15 1501  BP: 161/74   Pulse: 87   Temp: 97.9 F (36.6 C)   TempSrc: Oral   Resp: 20   Height: 5\' 3"  (1.6 m)   Weight: 270 lb (122.471 kg)   SpO2: 95%   PainSc:  3   Calculated BMI: Body mass index is 47.84 kg/(m^2). General appearance: alert, cooperative, appears stated age, mild distress and moderately obese Eyes: conjunctivae/corneas clear. PERRL, EOM's intact. Fundi benign. Lungs: No evidence respiratory distress, no audible rales or ronchi and no use of accessory muscles of respiration Neck: no adenopathy, no carotid bruit, no JVD, supple, symmetrical, trachea midline and thyroid not enlarged, symmetric, no tenderness/mass/nodules Back: symmetric, no curvature. ROM normal. No CVA tenderness. Extremities: extremities normal, atraumatic, no cyanosis or edema Pulses: 2+ and symmetric Skin: Skin color, texture, turgor normal. No rashes or lesions Neurologic: Gait: Antalgic    Assessment: Encounter Diagnosis:  Primary Diagnosis: Chronic pain [G89.29]  Plan: Alison Cox was seen today for back pain and knee pain.  Diagnoses and all orders for this visit:  Chronic  pain -     COMPLETE METABOLIC PANEL WITH GFR; Future -     C-reactive protein; Future -     Magnesium; Future -     Sedimentation rate; Future -     Vitamin D2,D3 Panel; Future -     HYDROcodone-acetaminophen (NORCO) 5-325 MG tablet; Take 1 tablet by mouth every 6 (six) hours as needed for moderate pain or severe pain. -     HYDROcodone-acetaminophen (NORCO) 5-325 MG tablet; Take 1 tablet by mouth every 6 (six) hours as needed for moderate pain or severe pain. -     HYDROcodone-acetaminophen (NORCO) 5-325 MG tablet; Take 1 tablet by mouth every 6 (six) hours as needed for moderate pain or severe pain. -     Magnesium Oxide 500 MG CAPS; Take 1 capsule (500 mg total) by mouth daily.  Long term current use of opiate analgesic -     Drugs of abuse screen w/o alc, rtn urine-sln -     Drugs of abuse screen w/o alc, rtn urine-sln; Future  Long term prescription opiate use  Opiate use  Encounter for therapeutic drug level monitoring  Uncomplicated opioid dependence ([place])  Osteoarthritis of spine with radiculopathy, lumbar region  Grade 1 Anterolisthesis of L4 over L5  Therapeutic opioid-induced constipation (OIC) -     Wheat Dextrin (BENEFIBER) POWD; Stir 2 teaspoons of Benefiber into 4-8 oz of any non-carbonated beverage or soft food (hot or cold) TID.  Chronic bilateral knee pain (R>L)  Primary osteoarthritis of both knees  Vitamin D insufficiency -     Cholecalciferol (VITAMIN D3) 2000 UNITS capsule; Take 1 capsule (2,000 Units total) by mouth daily.  History of Bell's palsy (left-sided)  History of nephrolithiasis  History of Hashimoto thyroiditis  Primary  osteoarthritis involving multiple joints  Obesity, Class III, BMI 40-49.9 (morbid obesity) (HCC) (254% higher incidence of chronic low back pain)  Spondylolysis of lumbosacral region (Advanced disc degeneration and spondylosis L5-S1)     There are no Patient Instructions on file for this visit. Medications  discontinued today:  Medications Discontinued During This Encounter  Medication Reason  . thyroid (ARMOUR) 90 MG tablet Error  . traMADol (ULTRAM) 50 MG tablet Error  . HYDROcodone-acetaminophen (VICODIN) 5-500 MG per tablet Duplicate   Medications administered today:  Ms. Belo had no medications administered during this visit.  Primary Care Physician: Mathews Argyle, MD Location: [facility] Note by: Kathlen Brunswick. Dossie Arbour, M.D, DABA, DABAPM, DABPM, DABIPP, FIPP

## 2015-03-14 LAB — TOXASSURE SELECT 13 (MW), URINE: PDF: 0

## 2015-03-28 ENCOUNTER — Telehealth: Payer: Self-pay

## 2015-03-28 NOTE — Telephone Encounter (Signed)
Called patient and instructed her to bring back prescriptions and pills that she has tomorrow.

## 2015-03-28 NOTE — Telephone Encounter (Signed)
Pt says that she got scripts two weeks ago and the dosage is wrong. Pt wants to come by tomorrow to have that changed since she already has another appointment in the hospital. Pt wants someone to call her back and let her know if its okay

## 2015-03-29 ENCOUNTER — Other Ambulatory Visit: Payer: Self-pay | Admitting: Pain Medicine

## 2015-03-29 ENCOUNTER — Telehealth: Payer: Self-pay | Admitting: *Deleted

## 2015-03-29 DIAGNOSIS — F1190 Opioid use, unspecified, uncomplicated: Secondary | ICD-10-CM

## 2015-03-29 DIAGNOSIS — G8929 Other chronic pain: Secondary | ICD-10-CM

## 2015-03-29 MED ORDER — HYDROCODONE-ACETAMINOPHEN 10-325 MG PO TABS: 1.0000 | ORAL_TABLET | Freq: Four times a day (QID) | ORAL | Status: DC | PRN

## 2015-03-30 ENCOUNTER — Other Ambulatory Visit: Payer: Self-pay | Admitting: Pain Medicine

## 2015-06-06 ENCOUNTER — Encounter: Payer: Self-pay | Admitting: Pain Medicine

## 2015-06-06 ENCOUNTER — Other Ambulatory Visit: Payer: Self-pay | Admitting: Pain Medicine

## 2015-06-06 ENCOUNTER — Ambulatory Visit: Payer: 59 | Attending: Pain Medicine | Admitting: Pain Medicine

## 2015-06-06 VITALS — BP 158/91 | HR 85 | Temp 98.3°F | Resp 16 | Ht 63.0 in | Wt 270.0 lb

## 2015-06-06 DIAGNOSIS — M25562 Pain in left knee: Secondary | ICD-10-CM | POA: Diagnosis not present

## 2015-06-06 DIAGNOSIS — E559 Vitamin D deficiency, unspecified: Secondary | ICD-10-CM | POA: Insufficient documentation

## 2015-06-06 DIAGNOSIS — E063 Autoimmune thyroiditis: Secondary | ICD-10-CM | POA: Insufficient documentation

## 2015-06-06 DIAGNOSIS — M19072 Primary osteoarthritis, left ankle and foot: Secondary | ICD-10-CM | POA: Diagnosis not present

## 2015-06-06 DIAGNOSIS — Z79891 Long term (current) use of opiate analgesic: Secondary | ICD-10-CM | POA: Diagnosis not present

## 2015-06-06 DIAGNOSIS — M545 Low back pain, unspecified: Secondary | ICD-10-CM

## 2015-06-06 DIAGNOSIS — M47816 Spondylosis without myelopathy or radiculopathy, lumbar region: Secondary | ICD-10-CM | POA: Insufficient documentation

## 2015-06-06 DIAGNOSIS — M19071 Primary osteoarthritis, right ankle and foot: Secondary | ICD-10-CM | POA: Diagnosis not present

## 2015-06-06 DIAGNOSIS — M25552 Pain in left hip: Secondary | ICD-10-CM | POA: Diagnosis not present

## 2015-06-06 DIAGNOSIS — M4316 Spondylolisthesis, lumbar region: Secondary | ICD-10-CM | POA: Insufficient documentation

## 2015-06-06 DIAGNOSIS — I10 Essential (primary) hypertension: Secondary | ICD-10-CM | POA: Diagnosis not present

## 2015-06-06 DIAGNOSIS — Z5181 Encounter for therapeutic drug level monitoring: Secondary | ICD-10-CM | POA: Diagnosis not present

## 2015-06-06 DIAGNOSIS — M1990 Unspecified osteoarthritis, unspecified site: Secondary | ICD-10-CM | POA: Diagnosis not present

## 2015-06-06 DIAGNOSIS — M25579 Pain in unspecified ankle and joints of unspecified foot: Secondary | ICD-10-CM | POA: Insufficient documentation

## 2015-06-06 DIAGNOSIS — Z961 Presence of intraocular lens: Secondary | ICD-10-CM | POA: Diagnosis not present

## 2015-06-06 DIAGNOSIS — F1190 Opioid use, unspecified, uncomplicated: Secondary | ICD-10-CM

## 2015-06-06 DIAGNOSIS — G8929 Other chronic pain: Secondary | ICD-10-CM | POA: Diagnosis not present

## 2015-06-06 DIAGNOSIS — M25561 Pain in right knee: Secondary | ICD-10-CM

## 2015-06-06 DIAGNOSIS — M25569 Pain in unspecified knee: Secondary | ICD-10-CM | POA: Diagnosis present

## 2015-06-06 DIAGNOSIS — M549 Dorsalgia, unspecified: Secondary | ICD-10-CM | POA: Diagnosis present

## 2015-06-06 LAB — COMPREHENSIVE METABOLIC PANEL
ALK PHOS: 86 U/L (ref 38–126)
ALT: 34 U/L (ref 14–54)
AST: 28 U/L (ref 15–41)
Albumin: 4.7 g/dL (ref 3.5–5.0)
Anion gap: 9 (ref 5–15)
BILIRUBIN TOTAL: 1.1 mg/dL (ref 0.3–1.2)
BUN: 17 mg/dL (ref 6–20)
CALCIUM: 9.8 mg/dL (ref 8.9–10.3)
CO2: 27 mmol/L (ref 22–32)
CREATININE: 0.71 mg/dL (ref 0.44–1.00)
Chloride: 104 mmol/L (ref 101–111)
Glucose, Bld: 78 mg/dL (ref 65–99)
Potassium: 3.6 mmol/L (ref 3.5–5.1)
Sodium: 140 mmol/L (ref 135–145)
Total Protein: 8.1 g/dL (ref 6.5–8.1)

## 2015-06-06 LAB — C-REACTIVE PROTEIN

## 2015-06-06 LAB — SEDIMENTATION RATE: Sed Rate: 4 mm/hr (ref 0–30)

## 2015-06-06 LAB — MAGNESIUM: MAGNESIUM: 2 mg/dL (ref 1.7–2.4)

## 2015-06-06 MED ORDER — HYDROCODONE-ACETAMINOPHEN 10-325 MG PO TABS: 1.0000 | ORAL_TABLET | Freq: Four times a day (QID) | ORAL | Status: DC | PRN

## 2015-06-06 MED ORDER — MAGNESIUM OXIDE -MG SUPPLEMENT 500 MG PO CAPS
1.0000 | ORAL_CAPSULE | Freq: Every day | ORAL | Status: DC
Start: 2015-06-06 — End: 2016-01-31

## 2015-06-06 NOTE — Progress Notes (Signed)
Patient's Name: Alison Cox MRN: [id number] DOB: 03/23/1956 DOS: 06/06/2015  Primary Reason(s) for Visit: Encounter for Medication Management CC: Knee Pain; Back Pain; and Ankle Pain   HPI  Alison Cox is a 60 y.o. year old, female patient, who returns today as an established patient. 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; Chronic pain; Long term current use of opiate analgesic; Long term prescription opiate use; Opiate use (40 MME/Day); Encounter for therapeutic drug level monitoring; Opioid dependence ([place]); Lumbar spondylosis; Grade 1 Anterolisthesis of L4 over L5; Therapeutic opioid-induced constipation (OIC); Chronic knee pain (Location of Primary Source of Pain) (Bilateral) (R>L); Osteoarthritis of knees (Location of Primary Source of Pain) (Bilateral) (R>L); Vitamin D insufficiency; History of Bell's palsy (left-sided); History of nephrolithiasis; History of Hashimoto thyroiditis; Osteoarthrosis; Obesity, Class III, BMI 40-49.9 (morbid obesity) (HCC) (254% higher incidence of chronic low back pain); Spondylolysis of lumbosacral region (Advanced disc degeneration and spondylosis L5-S1); Chronic ankle pain (Location of Secondary source of pain) (Bilateral) (L>R); Osteoarthritis of ankles (Location of Secondary source of pain) (Bilateral) (L>R); Chronic low back pain (Location of Tertiary source of pain) (midline); and Chronic hip pain (Left) on her problem list.. Her primarily concern today is the Knee Pain; Back Pain; and Ankle Pain   The patient returns to the clinics today for pharmacological management of her pain. The patient indicates that the primary pain is in the knees with the right being worse than the left. This is followed by the ankle pain which is also bilateral but with the left being worse than the right. Next constant the low back pain which is usually in the midline and it feels like spasms. Then comes  the left hip pain which is currently being evaluated and treated by Dr. Hessie Knows, orthopedic surgeon at the [facility].  Reported Pain Score: 3  (last pain medicatioin @ 0700) Reported level is compatible with observation Pain Type: Chronic pain Pain Location: Knee (lower back and L ankle) Pain Orientation:  (bilateral) Pain Descriptors / Indicators: Aching, Sharp Pain Frequency: Intermittent  Date of Last Visit: 03/06/15 Service Provided on Last Visit: Med Refill  Controlled Substance Pharmacotherapy  Analgesic: Hydrocodone/APAP 10/325 one tablet by mouth every 6 hours (total: 40 mg/day) MME/day: 40 mg/day Pharmacokinetics: Onset of action (Liberation/Absorption): Within expected pharmacological parameters. (30 minutes) Time to Peak effect (Distribution): Timing and results are as within normal expected parameters. (2 hours) Duration of action (Metabolism/Excretion): Within normal limits for medication. (5-6 hours) Pharmacodynamics: Analgesic Effect: 80% Activity Facilitation: Medication(s) allow patient to sit, stand, walk, and do the basic ADLs Perceived Effectiveness: Described as relatively effective, allowing for increase in activities of daily living (ADL) Side-effects or Adverse reactions: None reported Monitoring: [place] PMP: Compliant with practice rules and regulations UDS Results/interpretation: The patient's last UDS was done on 03/06/2015 and it came back within normal limits except for the fact that it did not have any tramadol which had been declared. This is not considered to be a violation. Medication Assessment Form: Reviewed. Patient indicates being compliant with therapy Treatment compliance: Compliant Risk Assessment: Substance Use Disorder (SUD) Risk Level: Low  Pharmacologic Plan: Continue therapy as is  Lab Work: Illicit Drugs No results found for: THCU, COCAINSCRNUR, PCPSCRNUR, MDMA, AMPHETMU, METHADONE, ETOH  Inflammation Markers Lab Results   Component Value Date   ESRSEDRATE 4 06/06/2015   CRP <0.5 06/06/2015    Renal Function Lab Results  Component Value Date   BUN 17  06/06/2015   CREATININE 0.71 06/06/2015   GFRAA >60 06/06/2015   GFRNONAA >60 06/06/2015    Hepatic Function Lab Results  Component Value Date   AST 28 06/06/2015   ALT 34 06/06/2015   ALBUMIN 4.7 06/06/2015    Electrolytes Lab Results  Component Value Date   NA 140 06/06/2015   K 3.6 06/06/2015   CL 104 06/06/2015   CALCIUM 9.8 06/06/2015   MG 2.0 06/06/2015    Allergies  Alison Cox is allergic to penicillins; shellfish allergy; and sulfa antibiotics.  Meds  The patient has a current medication list which includes the following prescription(s): amlodipine-benazepril, vitamin d3, doxycycline, estradiol-norethindrone, hydrochlorothiazide, hydrocodone-acetaminophen, hydrocodone-acetaminophen, hydrocodone-acetaminophen, levothyroxine, magnesium oxide, meloxicam, and benefiber.  Current Outpatient Prescriptions on File Prior to Visit  Medication Sig  . amLODipine-benazepril (LOTREL) 5-10 MG per capsule Take 1 capsule by mouth daily.  . Cholecalciferol (VITAMIN D3) 2000 UNITS capsule Take 1 capsule (2,000 Units total) by mouth daily.  [name] Kitchen doxycycline (DORYX) 100 MG DR capsule Take 100 mg by mouth 2 (two) times daily.  [name] Kitchen estradiol-norethindrone (ACTIVELLA) 1-0.5 MG per tablet Take 0.5 tablets by mouth daily.   . hydrochlorothiazide (HYDRODIURIL) 25 MG tablet Take 25 mg by mouth daily.  [name] Kitchen levothyroxine (SYNTHROID, LEVOTHROID) 150 MCG tablet Take 150 mcg by mouth daily before breakfast.  . meloxicam (MOBIC) 15 MG tablet Take 15 mg by mouth daily.  . Wheat Dextrin (BENEFIBER) POWD Stir 2 teaspoons of Benefiber into 4-8 oz of any non-carbonated beverage or soft food (hot or cold) TID.   No current facility-administered medications on file prior to visit.    ROS  Constitutional: Afebrile, no chills, well hydrated and well  nourished Gastrointestinal: negative Musculoskeletal:negative Neurological: negative Behavioral/Psych: negative  PFSH  Medical:  Alison Cox  has a past medical history of Hypertension; Thyroid disease; Osteoarthritis; Kidney stone; History of Bell's palsy (03/06/2015); History of nephrolithiasis (03/06/2015); and History of Hashimoto thyroiditis (03/06/2015). Family: family history includes Hypertension in her father and mother. Surgical:  has past surgical history that includes Endometrial ablation; DILATATION AND CURETTAGE; and Corneal transplant. Tobacco:  reports that she has never smoked. She does not have any smokeless tobacco history on file. Alcohol:  reports that she does not drink alcohol. Drug:  reports that she does not use illicit drugs.  Physical Exam  Vitals:  Today's Vitals   06/06/15 1034  BP: 158/91  Pulse: 85  Temp: 98.3 F (36.8 C)  TempSrc: Oral  Resp: 16  Height: 5\' 3"  (1.6 m)  Weight: 270 lb (122.471 kg)  SpO2: 95%  PainSc: 3     Calculated BMI: Body mass index is 47.84 kg/(m^2).  General appearance: alert, cooperative, appears stated age and no distress Eyes: PERLA Respiratory: No evidence respiratory distress, no audible rales or ronchi and no use of accessory muscles of respiration  Cervical Spine Inspection: Normal anatomy Alignment: Symetrical ROM: Adequate  Upper Extremities Inspection: No gross anomalies detected ROM: Adequate Sensory: Normal Motor: Unremarkable  Thoracic Spine Inspection: No gross anomalies detected Alignment: Symetrical ROM: Adequate  Lumbar Spine Inspection: No gross anomalies detected Alignment: Symetrical ROM: Adequate  Gait: WNL  Lower Extremities Inspection: No gross anomalies detected ROM: Adequate Sensory:  Normal Motor: Unremarkable  Assessment & Plan  Primary Diagnosis & Pertinent Problem List: The primary encounter diagnosis was Chronic pain. Diagnoses of Encounter for therapeutic drug level  monitoring, Long term current use of opiate analgesic, Opiate use, Chronic bilateral knee pain (R>L), Chronic ankle pain, unspecified laterality, Osteoarthritis  of ankles (Location of Secondary source of pain) (Bilateral) (L>R), Chronic low back pain (Location of Tertiary source of pain) (midline), and Chronic hip pain (Left) were also pertinent to this visit.  Visit Diagnosis: 1. Chronic pain   2. Encounter for therapeutic drug level monitoring   3. Long term current use of opiate analgesic   4. Opiate use   5. Chronic bilateral knee pain (R>L)   6. Chronic ankle pain, unspecified laterality   7. Osteoarthritis of ankles (Location of Secondary source of pain) (Bilateral) (L>R)   8. Chronic low back pain (Location of Tertiary source of pain) (midline)   9. Chronic hip pain (Left)     Assessment: No problem-specific assessment & plan notes found for this encounter.   Plan of Care  Pharmacotherapy (Medications Ordered): Meds ordered this encounter  Medications  . HYDROcodone-acetaminophen (NORCO) 10-325 MG tablet    Sig: Take 1 tablet by mouth every 6 (six) hours as needed for moderate pain or severe pain.    Dispense:  120 tablet    Refill:  0    Do not place this medication, or any other prescription from our practice, on "Automatic Refill". Patient may have prescription filled one day early if pharmacy is closed on scheduled refill date. Do not fill until: 06/24/15 To last until: 07/24/15  . HYDROcodone-acetaminophen (NORCO) 10-325 MG tablet    Sig: Take 1 tablet by mouth every 6 (six) hours as needed for moderate pain or severe pain.    Dispense:  120 tablet    Refill:  0    Do not place this medication, or any other prescription from our practice, on "Automatic Refill". Patient may have prescription filled one day early if pharmacy is closed on scheduled refill date. Do not fill until: 07/24/15 To last until: 08/23/15  . HYDROcodone-acetaminophen (NORCO) 10-325 MG tablet     Sig: Take 1 tablet by mouth every 6 (six) hours as needed for moderate pain or severe pain.    Dispense:  120 tablet    Refill:  0    Do not place this medication, or any other prescription from our practice, on "Automatic Refill". Patient may have prescription filled one day early if pharmacy is closed on scheduled refill date. Do not fill until: 08/23/15 To last until: 09/22/15  . Magnesium Oxide 500 MG CAPS    Sig: Take 1 capsule (500 mg total) by mouth daily.    Dispense:  100 capsule    Refill:  PRN    Do not place this medication, or any other prescription from our practice, on "Automatic Refill". Patient may have prescription filled one day early if pharmacy is closed on scheduled refill date.    Lab-work & Procedure Ordered: Orders Placed This Encounter  Procedures  . KNEE INJECTION    Standing Status: Standing     Number of Occurrences: 1     Standing Expiration Date: 06/05/2016    Scheduling Instructions:     Side: Bilateral     Sedation: No Sedation.     Timeframe: PRN Procedure. Patient will call.    Order Specific Question:  Where will this procedure be performed?    Answer:  [facility]  . Drugs of abuse screen w/o alc, rtn urine-sln    Volume: 10 ml(s). Minimum 3 ml of urine is needed. Document temperature of fresh sample. Indications: Long term (current) use of opiate analgesic (Z79.891) Test#: [id number] (Tox[employer]ure Select-13)  . Comprehensive metabolic panel  Order Specific Question:  Has the patient fasted?    Answer:  No  . C-reactive protein  . Magnesium  . Sedimentation rate  . Vitamin B12    Indication: Bone Pain (M89.9)  . Vitamin D pnl(25-hydrxy+1,25-dihy)-bld    Imaging Ordered: None  Interventional Therapies: Scheduled: None at this point PRN Procedures: Bilateral intra-articular knee injection with steroids and local anesthetics.    Referral(s) or Consult(s): None at this point. The patient is already seen Dr. Hessie Knows for the knee  and hip pain.  Medications administered during this visit: Ms. Hubbartt had no medications administered during this visit.  Future Appointments Date Time Provider [place]  08/29/2015 11:00 AM Milinda Pointer, MD  Hospital None    Primary Care Physician: Mathews Argyle, MD Location: [facility] Note by: Kathlen Brunswick. Dossie Arbour, M.D, DABA, DABAPM, DABPM, DABIPP, FIPP

## 2015-06-06 NOTE — Progress Notes (Signed)
Patient here for medication refills.  Denies any problems.   Does not have medication to count

## 2015-06-07 DIAGNOSIS — M19071 Primary osteoarthritis, right ankle and foot: Secondary | ICD-10-CM | POA: Insufficient documentation

## 2015-06-07 DIAGNOSIS — M545 Low back pain, unspecified: Secondary | ICD-10-CM | POA: Insufficient documentation

## 2015-06-07 DIAGNOSIS — G8929 Other chronic pain: Secondary | ICD-10-CM | POA: Insufficient documentation

## 2015-06-07 DIAGNOSIS — M25552 Pain in left hip: Secondary | ICD-10-CM

## 2015-06-07 DIAGNOSIS — M19072 Primary osteoarthritis, left ankle and foot: Secondary | ICD-10-CM

## 2015-06-15 LAB — TOXASSURE SELECT 13 (MW), URINE: PDF: 0

## 2015-07-23 ENCOUNTER — Other Ambulatory Visit: Payer: Self-pay | Admitting: Pain Medicine

## 2015-07-25 NOTE — Telephone Encounter (Signed)

## 2015-08-29 ENCOUNTER — Encounter: Payer: 59 | Admitting: Pain Medicine

## 2015-09-19 ENCOUNTER — Other Ambulatory Visit: Payer: Self-pay | Admitting: Surgery

## 2015-09-19 DIAGNOSIS — E042 Nontoxic multinodular goiter: Secondary | ICD-10-CM

## 2015-10-08 ENCOUNTER — Ambulatory Visit: Payer: 59 | Attending: Pain Medicine | Admitting: Pain Medicine

## 2015-10-08 ENCOUNTER — Encounter: Payer: Self-pay | Admitting: Pain Medicine

## 2015-10-08 VITALS — BP 156/95 | HR 81 | Temp 97.9°F | Resp 18 | Ht 62.0 in | Wt 270.0 lb

## 2015-10-08 DIAGNOSIS — E041 Nontoxic single thyroid nodule: Secondary | ICD-10-CM | POA: Insufficient documentation

## 2015-10-08 DIAGNOSIS — M170 Bilateral primary osteoarthritis of knee: Secondary | ICD-10-CM | POA: Insufficient documentation

## 2015-10-08 DIAGNOSIS — M19071 Primary osteoarthritis, right ankle and foot: Secondary | ICD-10-CM | POA: Diagnosis not present

## 2015-10-08 DIAGNOSIS — Z87442 Personal history of urinary calculi: Secondary | ICD-10-CM | POA: Insufficient documentation

## 2015-10-08 DIAGNOSIS — E039 Hypothyroidism, unspecified: Secondary | ICD-10-CM | POA: Insufficient documentation

## 2015-10-08 DIAGNOSIS — M25579 Pain in unspecified ankle and joints of unspecified foot: Secondary | ICD-10-CM | POA: Diagnosis present

## 2015-10-08 DIAGNOSIS — Z79891 Long term (current) use of opiate analgesic: Secondary | ICD-10-CM | POA: Diagnosis not present

## 2015-10-08 DIAGNOSIS — G8929 Other chronic pain: Secondary | ICD-10-CM | POA: Diagnosis not present

## 2015-10-08 DIAGNOSIS — Z961 Presence of intraocular lens: Secondary | ICD-10-CM | POA: Insufficient documentation

## 2015-10-08 DIAGNOSIS — M4316 Spondylolisthesis, lumbar region: Secondary | ICD-10-CM | POA: Diagnosis not present

## 2015-10-08 DIAGNOSIS — M25561 Pain in right knee: Secondary | ICD-10-CM | POA: Diagnosis not present

## 2015-10-08 DIAGNOSIS — I10 Essential (primary) hypertension: Secondary | ICD-10-CM | POA: Insufficient documentation

## 2015-10-08 DIAGNOSIS — M25569 Pain in unspecified knee: Secondary | ICD-10-CM | POA: Diagnosis present

## 2015-10-08 DIAGNOSIS — M25562 Pain in left knee: Secondary | ICD-10-CM | POA: Insufficient documentation

## 2015-10-08 DIAGNOSIS — E559 Vitamin D deficiency, unspecified: Secondary | ICD-10-CM | POA: Diagnosis not present

## 2015-10-08 DIAGNOSIS — M19072 Primary osteoarthritis, left ankle and foot: Secondary | ICD-10-CM | POA: Insufficient documentation

## 2015-10-08 DIAGNOSIS — K5903 Drug induced constipation: Secondary | ICD-10-CM | POA: Diagnosis not present

## 2015-10-08 DIAGNOSIS — Z6841 Body Mass Index (BMI) 40.0 and over, adult: Secondary | ICD-10-CM | POA: Insufficient documentation

## 2015-10-08 MED ORDER — HYDROCODONE-ACETAMINOPHEN 10-325 MG PO TABS: 1.0000 | ORAL_TABLET | Freq: Four times a day (QID) | ORAL | Status: DC | PRN

## 2015-10-08 NOTE — Progress Notes (Signed)
Safety precautions to be maintained throughout the outpatient stay will include: orient to surroundings, keep bed in low position, maintain call bell within reach at all times, provide assistance with transfer out of bed and ambulation. norco pill count # 48.5/120  Filled 09-18-15

## 2015-10-08 NOTE — Progress Notes (Signed)
Patient's Name: Alison Cox  Patient type: Established  MRN: [id number]  Service setting: Ambulatory outpatient  DOB: 17-May-1955  Location: [facility]  DOS: 10/08/2015  Primary Care Physician: Mathews Argyle, MD  Note by: Kathlen Brunswick. Dossie Arbour, M.D, DABA, DABAPM, DABPM, DABIPP, FIPP  Referring Physician: Lajean Manes, MD  Specialty: Board-Certified Interventional Pain Management  Last Visit to Pain Management: 07/23/2015   Primary Reason(s) for Visit: Encounter for prescription drug management (Level of risk: moderate) CC: Ankle Pain and Knee Pain   HPI  Alison Cox is a 60 y.o. year old, female patient, who returns today as an established patient. 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; Chronic pain; Long term current use of opiate analgesic; Long term prescription opiate use; Opiate use (40 MME/Day); Encounter for therapeutic drug level monitoring; Opioid dependence ([place]); Lumbar spondylosis; Grade 1 Anterolisthesis of L4 over L5; Therapeutic opioid-induced constipation (OIC); Chronic knee pain (Location of Primary Source of Pain) (Bilateral) (R>L); Osteoarthritis of knees (Location of Primary Source of Pain) (Bilateral) (R>L); Vitamin D insufficiency; History of Bell's palsy (left-sided); History of nephrolithiasis; History of Hashimoto thyroiditis; Osteoarthrosis; Obesity, Class III, BMI 40-49.9 (morbid obesity) (HCC) (254% higher incidence of chronic low back pain); Spondylolysis of lumbosacral region (Advanced disc degeneration and spondylosis L5-S1); Chronic ankle pain (Location of Secondary source of pain) (Bilateral) (L>R); Osteoarthritis of ankles (Location of Secondary source of pain) (Bilateral) (L>R); Chronic low back pain (Location of Tertiary source of pain) (midline); and Chronic hip pain (Left) on her problem list.. Her primarily concern today is the Ankle Pain and Knee  Pain   Pain Assessment: Self-Reported Pain Score: 3  Reported level is compatible with observation       Pain Type: Chronic pain Pain Location: Ankle (knee) Pain Orientation:  (right ankle, bilateral knees) Pain Descriptors / Indicators: Aching, Sharp Pain Frequency: Constant  The patient comes into the clinics today for pharmacological management of her chronic pain. I last saw this patient on 07/23/2015. The patient  reports that she does not use illicit drugs. Her body mass index is 49.37 kg/(m^2).  Date of Last Visit: 06/06/15 Service Provided on Last Visit: Med Refill  Controlled Substance Pharmacotherapy Assessment & REMS (Risk Evaluation and Mitigation Strategy)  Analgesic: Hydrocodone/APAP 10/325 one tablet by mouth every 6 hours (total: 40 mg/day) Pill Count: norco pill count # 48.5/120 Filled 09-18-15 MME/day: 40 mg/day Pharmacokinetics: Onset of action (Liberation/Absorption): Within expected pharmacological parameters Time to Peak effect (Distribution): Timing and results are as within normal expected parameters Duration of action (Metabolism/Excretion): Within normal limits for medication Pharmacodynamics: Analgesic Effect: More than 50% Activity Facilitation: Medication(s) allow patient to sit, stand, walk, and do the basic ADLs Perceived Effectiveness: Described as relatively effective, allowing for increase in activities of daily living (ADL) Side-effects or Adverse reactions: None reported Monitoring: [place] PMP: Online review of the past 103-month period conducted. Compliant with practice rules and regulations Last UDS on record: TOXASSURE SELECT 13  Date Value Ref Range Status  06/06/2015 FINAL  Final    Comment:    ==================================================================== TOXASSURE SELECT 13 (MW) ==================================================================== Test                             Result       Flag       Units Drug Present and Declared  for Prescription Verification   Hydrocodone  1374         EXPECTED   ng/mg creat   Hydromorphone                  543          EXPECTED   ng/mg creat   Dihydrocodeine                 313          EXPECTED   ng/mg creat   Norhydrocodone                 1244         EXPECTED   ng/mg creat    Sources of hydrocodone include scheduled prescription    medications. Hydromorphone, dihydrocodeine and norhydrocodone are    expected metabolites of hydrocodone. Hydromorphone and    dihydrocodeine are also available as scheduled prescription    medications. ==================================================================== Test                      Result    Flag   Units      Ref Range   Creatinine              154              mg/dL      >=20 ==================================================================== Declared Medications:  The flagging and interpretation on this report are based on the  following declared medications.  Unexpected results may arise from  inaccuracies in the declared medications.  **Note: The testing scope of this panel includes these medications:  Hydrocodone ==================================================================== For clinical consultation, please call [phone]. ====================================================================    UDS interpretation: Compliant          Medication Assessment Form: Reviewed. Patient indicates being compliant with therapy Treatment compliance: Compliant Risk Assessment: Aberrant Behavior: None observed today Substance Use Disorder (SUD) Risk Level: No change since last visit Risk of opioid abuse or dependence: 0.7-3.0% with doses ? 36 MME/day and 6.1-26% with doses ? 120 MME/day. Opioid Risk Tool (ORT) Score: Total Score: 0 Low Risk for SUD (Score <3) Depression Scale Score: PHQ-2: PHQ-2 Total Score: 0 No depression (0) PHQ-9: PHQ-9 Total Score: 0 No depression (0-4)  Pharmacologic Plan: No  change in therapy, at this time  Laboratory Chemistry  Inflammation Markers Lab Results  Component Value Date   ESRSEDRATE 4 06/06/2015   CRP <0.5 06/06/2015    Renal Function Lab Results  Component Value Date   BUN 17 06/06/2015   CREATININE 0.71 06/06/2015   GFRAA >60 06/06/2015   GFRNONAA >60 06/06/2015    Hepatic Function Lab Results  Component Value Date   AST 28 06/06/2015   ALT 34 06/06/2015   ALBUMIN 4.7 06/06/2015    Electrolytes Lab Results  Component Value Date   NA 140 06/06/2015   K 3.6 06/06/2015   CL 104 06/06/2015   CALCIUM 9.8 06/06/2015   MG 2.0 06/06/2015    Pain Modulating Vitamins No results found for: VD25OH, VD125OH2TOT, [id number], [id number], VITAMINB12  Coagulation Parameters No results found for: INR, LABPROT, APTT, PLT  Note: Labs Reviewed.  Recent Diagnostic Imaging  US Breast Ltd Uni Right Inc Axilla  01/04/2015  CLINICAL DATA:  Patient is an asymptomatic 60 year old female who was recalled from screening mammography to further evaluate the right breast. She is currently taking exogenous hormones and reports a recent change in dosing. EXAM: DIGITAL DIAGNOSTIC RIGHT MAMMOGRAM WITH 3D TOMOSYNTHESIS WITH CAD ULTRASOUND RIGHT BREAST  COMPARISON:  Previous exam(s). ACR Breast Density Category b: There are scattered areas of fibroglandular density. FINDINGS: Right unilateral digital diagnostic mammography demonstrates scattered fibroglandular densities. The previously noted questionable mass is not reproducible on today's study. This area of the breast effaces with spot compression view and 3D tomosynthesis consistent with summation of normal fibroglandular tissue. An oval low-density well-circumscribed mass is identified in the lower outer breast measuring approximately 10 mm in diameter. No suspicious microcalcifications. Mammographic images were processed with CAD. On physical exam, no palpable masses are noted. Targeted ultrasound is performed.  This demonstrates a minimally complicated cyst with benign sonographic features in the lower outer right breast at 7 o'clock 5 cm from the nipple measuring approximately 10 x 7 x 4 mm. This represents an excellent sonographic correlate to the finding on mammography. There are additional sub cm benign anechoic simple cysts and minimally complicated cysts identified under real-time sonography. IMPRESSION: No mammographic or sonographic findings of malignancy in the right breast. RECOMMENDATION: Screening mammogram in one year.(Code:SM-B-01Y) I have discussed the findings and recommendations with the patient. Results were also provided in writing at the conclusion of the visit. If applicable, a reminder letter will be sent to the patient regarding the next appointment. BI-RADS CATEGORY  2: Benign. Electronically Signed   By: Andres Shad   On: 01/04/2015 10:07   Mm Diag Breast Tomo Uni Right  01/04/2015  CLINICAL DATA:  Patient is an asymptomatic 60 year old female who was recalled from screening mammography to further evaluate the right breast. She is currently taking exogenous hormones and reports a recent change in dosing. EXAM: DIGITAL DIAGNOSTIC RIGHT MAMMOGRAM WITH 3D TOMOSYNTHESIS WITH CAD ULTRASOUND RIGHT BREAST COMPARISON:  Previous exam(s). ACR Breast Density Category b: There are scattered areas of fibroglandular density. FINDINGS: Right unilateral digital diagnostic mammography demonstrates scattered fibroglandular densities. The previously noted questionable mass is not reproducible on today's study. This area of the breast effaces with spot compression view and 3D tomosynthesis consistent with summation of normal fibroglandular tissue. An oval low-density well-circumscribed mass is identified in the lower outer breast measuring approximately 10 mm in diameter. No suspicious microcalcifications. Mammographic images were processed with CAD. On physical exam, no palpable masses are noted. Targeted  ultrasound is performed. This demonstrates a minimally complicated cyst with benign sonographic features in the lower outer right breast at 7 o'clock 5 cm from the nipple measuring approximately 10 x 7 x 4 mm. This represents an excellent sonographic correlate to the finding on mammography. There are additional sub cm benign anechoic simple cysts and minimally complicated cysts identified under real-time sonography. IMPRESSION: No mammographic or sonographic findings of malignancy in the right breast. RECOMMENDATION: Screening mammogram in one year.(Code:SM-B-01Y) I have discussed the findings and recommendations with the patient. Results were also provided in writing at the conclusion of the visit. If applicable, a reminder letter will be sent to the patient regarding the next appointment. BI-RADS CATEGORY  2: Benign. Electronically Signed   By: Andres Shad   On: 01/04/2015 10:07    Meds  The patient has a current medication list which includes the following prescription(s): amlodipine-benazepril, vitamin d3, doxycycline, estradiol-norethindrone, hydrochlorothiazide, hydrocodone-acetaminophen, hydrocodone-acetaminophen, hydrocodone-acetaminophen, levothyroxine, magnesium oxide, meloxicam, potassium chloride, and benefiber.  Current Outpatient Prescriptions on File Prior to Visit  Medication Sig  . amLODipine-benazepril (LOTREL) 5-10 MG per capsule Take 1 capsule by mouth daily.  . Cholecalciferol (VITAMIN D3) 2000 UNITS capsule Take 1 capsule (2,000 Units total) by mouth daily.  [name] Kitchen doxycycline (DORYX) 100  MG DR capsule Take 100 mg by mouth 2 (two) times daily.  [name] Kitchen estradiol-norethindrone (ACTIVELLA) 1-0.5 MG per tablet Take 0.5 tablets by mouth daily.   . hydrochlorothiazide (HYDRODIURIL) 25 MG tablet Take 25 mg by mouth daily.  [name] Kitchen levothyroxine (SYNTHROID, LEVOTHROID) 150 MCG tablet Take 150 mcg by mouth daily before breakfast.  . Magnesium Oxide 500 MG CAPS Take 1 capsule (500 mg total) by mouth  daily.  . meloxicam (MOBIC) 15 MG tablet Take 15 mg by mouth daily.  . Wheat Dextrin (BENEFIBER) POWD Stir 2 teaspoons of Benefiber into 4-8 oz of any non-carbonated beverage or soft food (hot or cold) TID.   No current facility-administered medications on file prior to visit.    ROS  Constitutional: Denies any fever or chills Gastrointestinal: No reported hemesis, hematochezia, vomiting, or acute GI distress Musculoskeletal: Denies any acute onset joint swelling, redness, loss of ROM, or weakness Neurological: No reported episodes of acute onset apraxia, aphasia, dysarthria, agnosia, amnesia, paralysis, loss of coordination, or loss of consciousness  Allergies  Ms. Brackenridge is allergic to penicillins; shellfish allergy; and sulfa antibiotics.  [place]  Medical:  Ms. Mcilwain  has a past medical history of Hypertension; Thyroid disease; Osteoarthritis; Kidney stone; History of Bell's palsy (03/06/2015); History of nephrolithiasis (03/06/2015); History of Hashimoto thyroiditis (03/06/2015); and Macular corneal dystrophy (2005). Family: family history includes Hypertension in her father and mother. Surgical:  has past surgical history that includes Endometrial ablation; DILATATION AND CURETTAGE; and Corneal transplant. Tobacco:  reports that she has never smoked. She does not have any smokeless tobacco history on file. Alcohol:  reports that she does not drink alcohol. Drug:  reports that she does not use illicit drugs.  Constitutional Exam  Vitals: Blood pressure 156/95, pulse 81, temperature 97.9 F (36.6 C), resp. rate 18, height 5\' 2"  (1.575 m), weight 270 lb (122.471 kg), SpO2 98 %. General appearance: Well nourished, well developed, and well hydrated. In no acute distress Calculated BMI/Body habitus: Body mass index is 49.37 kg/(m^2). (>40 kg/m2) Extreme obesity (Class III) - 254% higher incidence of chronic pain Psych/Mental status: Alert and oriented x 3 (person, place, & time) Eyes:  PERLA Respiratory: No evidence of acute respiratory distress  Cervical Spine Exam  Inspection: No masses, redness, or swelling Alignment: Symmetrical ROM: Functional: ROM is within functional limits [facility]) Stability: No instability detected Muscle strength & Tone: Functionally intact Sensory: Unimpaired Palpation: No complaints of tenderness  Upper Extremity (UE) Exam    Side: Right upper extremity  Side: Left upper extremity  Inspection: No masses, redness, swelling, or asymmetry  Inspection: No masses, redness, swelling, or asymmetry  ROM:  ROM:  Functional: ROM is within functional limits [facility])  Functional: ROM is within functional limits [facility])  Muscle strength & Tone: Functionally intact  Muscle strength & Tone: Functionally intact  Sensory: Unimpaired  Sensory: Unimpaired  Palpation: Non-contributory  Palpation: Non-contributory   Thoracic Spine Exam  Inspection: No masses, redness, or swelling Alignment: Symmetrical ROM: Functional: ROM is within functional limits [facility]) Stability: No instability detected Sensory: Unimpaired Muscle strength & Tone: Functionally intact Palpation: No complaints of tenderness  Lumbar Spine Exam  Inspection: No masses, redness, or swelling Alignment: Symmetrical ROM: Functional: ROM is within functional limits [facility]) Stability: No instability detected Muscle strength & Tone: Functionally intact Sensory: Unimpaired Palpation: No complaints of tenderness Provocative Tests: Lumbar Hyperextension and rotation test: deferred Patrick's Maneuver: deferred  Gait & Posture Assessment  Ambulation: Unassisted Gait: Unaffected Posture: WNL  Lower Extremity Exam  Side: Right lower extremity  Side: Left lower extremity  Inspection: No masses, redness, swelling, or asymmetry ROM:  Inspection: No masses, redness, swelling, or asymmetry ROM:  Functional: ROM is within functional limits [facility])  Functional: ROM is within functional limits [facility])   Muscle strength & Tone: Functionally intact  Muscle strength & Tone: Functionally intact  Sensory: Unimpaired  Sensory: Unimpaired  Palpation: Non-contributory  Palpation: Non-contributory   Assessment & Plan  Primary Diagnosis & Pertinent Problem List: The encounter diagnosis was Chronic pain.  Visit Diagnosis: 1. Chronic pain     Problems updated and reviewed during this visit: No problems updated.  Problem-specific Plan(s): No problem-specific assessment & plan notes found for this encounter.  No new assessment & plan notes have been filed under this hospital service since the last note was generated. Service: Pain Management   Plan of Care   Problem List Items Addressed This Visit      High   Chronic pain - Primary (Chronic)   Relevant Medications   HYDROcodone-acetaminophen (NORCO) 10-325 MG tablet   HYDROcodone-acetaminophen (NORCO) 10-325 MG tablet   HYDROcodone-acetaminophen (NORCO) 10-325 MG tablet       Pharmacotherapy (Medications Ordered): Meds ordered this encounter  Medications  . HYDROcodone-acetaminophen (NORCO) 10-325 MG tablet    Sig: Take 1 tablet by mouth every 6 (six) hours as needed for severe pain.    Dispense:  120 tablet    Refill:  0    Do not place this medication, or any other prescription from our practice, on "Automatic Refill". Patient may have prescription filled one day early if pharmacy is closed on scheduled refill date. Do not fill until: 10/08/15 To last until: 11/07/15  . HYDROcodone-acetaminophen (NORCO) 10-325 MG tablet    Sig: Take 1 tablet by mouth every 6 (six) hours as needed for severe pain.    Dispense:  120 tablet    Refill:  0    Do not place this medication, or any other prescription from our practice, on "Automatic Refill". Patient may have prescription filled one day early if pharmacy is closed on scheduled refill date. Do not fill until: 11/07/15 To last until: 12/07/15  . HYDROcodone-acetaminophen (NORCO)  10-325 MG tablet    Sig: Take 1 tablet by mouth every 6 (six) hours as needed for severe pain.    Dispense:  120 tablet    Refill:  0    Do not place this medication, or any other prescription from our practice, on "Automatic Refill". Patient may have prescription filled one day early if pharmacy is closed on scheduled refill date. Do not fill until: 12/07/15 To last until: 01/06/16    [facility] & Procedure Ordered: No orders of the defined types were placed in this encounter.    Imaging Ordered: None  Interventional Therapies: Scheduled:  None at this time.    Considering:  None at this time.    PRN Procedures:  None at this time.    Referral(s) or Consult(s): None at this time.  New Prescriptions   No medications on file    Medications administered during this visit: Ms. Tarrence had no medications administered during this visit.  Requested PM Follow-up: Return in about 3 months (around 12/31/2015) for Procedure (PRN - Patient will call), Medication Management, (3-Mo).  Future Appointments Date Time Provider [place]  10/15/2015 1:30 PM [facility] [place] [facility]  12/31/2015 10:40 AM Milinda Pointer, MD [facility] None    Primary Care Physician: Mathews Argyle, MD Location: [facility]  Outpatient Pain Management Facility Note by: Kathlen Brunswick. Dossie Arbour, M.D, DABA, DABAPM, DABPM, DABIPP, FIPP  Pain Score Disclaimer: We use the NRS-11 scale. This is a self-reported, subjective measurement of pain severity with only modest accuracy. It is used primarily to identify changes within a particular patient. It must be understood that outpatient pain scales are significantly less accurate that those used for research, where they can be applied under ideal controlled circumstances with minimal exposure to variables. In reality, the score is likely to be a combination of pain intensity and pain affect, where pain affect describes the degree of emotional arousal or changes in  action readiness caused by the sensory experience of pain. Factors such as social and work situation, setting, emotional state, anxiety levels, expectation, and prior pain experience may influence pain perception and show large inter-individual differences that may also be affected by time variables.  Patient instructions provided during this appointment: There are no Patient Instructions on file for this visit.

## 2015-10-15 ENCOUNTER — Ambulatory Visit
Admission: RE | Admit: 2015-10-15 | Discharge: 2015-10-15 | Disposition: A | Discharge status: Home / Self Care | Payer: 59 | Source: Ambulatory Visit | Attending: Surgery | Admitting: Surgery

## 2015-10-15 DIAGNOSIS — E042 Nontoxic multinodular goiter: Secondary | ICD-10-CM

## 2015-12-31 ENCOUNTER — Encounter: Payer: 59 | Admitting: Pain Medicine

## 2016-01-02 ENCOUNTER — Encounter: Payer: 59 | Admitting: Pain Medicine

## 2016-01-31 ENCOUNTER — Ambulatory Visit: Payer: 59 | Attending: Pain Medicine | Admitting: Pain Medicine

## 2016-01-31 ENCOUNTER — Encounter: Payer: Self-pay | Admitting: Pain Medicine

## 2016-01-31 VITALS — BP 168/71 | HR 96 | Temp 98.1°F | Resp 16 | Ht 62.0 in | Wt 270.0 lb

## 2016-01-31 DIAGNOSIS — Z79891 Long term (current) use of opiate analgesic: Secondary | ICD-10-CM | POA: Insufficient documentation

## 2016-01-31 DIAGNOSIS — Z6841 Body Mass Index (BMI) 40.0 and over, adult: Secondary | ICD-10-CM | POA: Insufficient documentation

## 2016-01-31 DIAGNOSIS — I10 Essential (primary) hypertension: Secondary | ICD-10-CM | POA: Diagnosis not present

## 2016-01-31 DIAGNOSIS — F1190 Opioid use, unspecified, uncomplicated: Secondary | ICD-10-CM

## 2016-01-31 DIAGNOSIS — G894 Chronic pain syndrome: Secondary | ICD-10-CM | POA: Insufficient documentation

## 2016-01-31 DIAGNOSIS — Z8249 Family history of ischemic heart disease and other diseases of the circulatory system: Secondary | ICD-10-CM | POA: Insufficient documentation

## 2016-01-31 DIAGNOSIS — T402X5A Adverse effect of other opioids, initial encounter: Secondary | ICD-10-CM | POA: Insufficient documentation

## 2016-01-31 DIAGNOSIS — E039 Hypothyroidism, unspecified: Secondary | ICD-10-CM | POA: Diagnosis not present

## 2016-01-31 DIAGNOSIS — K5903 Drug induced constipation: Secondary | ICD-10-CM | POA: Diagnosis not present

## 2016-01-31 DIAGNOSIS — M4316 Spondylolisthesis, lumbar region: Secondary | ICD-10-CM | POA: Diagnosis not present

## 2016-01-31 DIAGNOSIS — Z79899 Other long term (current) drug therapy: Secondary | ICD-10-CM | POA: Diagnosis not present

## 2016-01-31 DIAGNOSIS — M170 Bilateral primary osteoarthritis of knee: Secondary | ICD-10-CM | POA: Insufficient documentation

## 2016-01-31 MED ORDER — HYDROCODONE-ACETAMINOPHEN 10-325 MG PO TABS: 1.0000 | ORAL_TABLET | Freq: Four times a day (QID) | ORAL | 0 refills | Status: DC | PRN

## 2016-01-31 MED ORDER — MAGNESIUM OXIDE -MG SUPPLEMENT 500 MG PO CAPS: 1.0000 | ORAL_CAPSULE | Freq: Every day | ORAL | 0 refills | Status: DC

## 2016-01-31 MED ORDER — BENEFIBER PO POWD: ORAL | 99 refills | Status: DC

## 2016-01-31 NOTE — Progress Notes (Signed)
Patient's Name: Alison Cox  MRN: [id number]  Referring Provider: Lajean Manes, MD  DOB: 1955/09/15  PCP: Mathews Argyle, MD  DOS: 01/31/2016  Note by: Kathlen Brunswick. Dossie Arbour, MD  Service setting: Ambulatory outpatient  Specialty: Interventional Pain Management  Location: [facility] (AMB) Pain Management Facility    Patient type: Established   Primary Reason(s) for Visit: Encounter for prescription drug management (Level of risk: moderate) CC: No chief complaint on file.  HPI  Alison Cox is a 60 y.o. year old, female patient, who comes today for an initial evaluation. 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; Chronic pain; Long term current use of opiate analgesic; Long term prescription opiate use; Opiate use (40 MME/Day); Encounter for therapeutic drug level monitoring; Opioid dependence ([place]); Lumbar spondylosis; Grade 1 Anterolisthesis of L4 over L5; Therapeutic opioid-induced constipation (OIC); Chronic knee pain (Location of Primary Source of Pain) (Bilateral) (R>L); Osteoarthritis of knees (Location of Primary Source of Pain) (Bilateral) (R>L); Vitamin D insufficiency; History of Bell's palsy (left-sided); History of nephrolithiasis; History of Hashimoto thyroiditis; Osteoarthrosis; Obesity, Class III, BMI 40-49.9 (morbid obesity) (HCC) (254% higher incidence of chronic low back pain); Spondylolysis of lumbosacral region (Advanced disc degeneration and spondylosis L5-S1); Chronic ankle pain (Location of Secondary source of pain) (Bilateral) (L>R); Osteoarthritis of ankles (Location of Secondary source of pain) (Bilateral) (L>R); Chronic low back pain (Location of Tertiary source of pain) (midline); and Chronic hip pain (Left) on her problem list.. Her primarily concern today is the No chief complaint on file.  Pain Assessment: Self-Reported Pain Score: 0-No pain/10             Reported level is compatible with  observation.       Pain Type: Chronic pain Pain Location: Back Pain Orientation: Lower Pain Descriptors / Indicators: Aching, Sharp, Penetrating, Discomfort Pain Frequency: Constant  The patient comes into the clinics today for pharmacological management of her chronic pain. I last saw this patient on 10/08/2015. The patient  reports that she does not use drugs. Her body mass index is 49.38 kg/m.  Date of Last Visit: 10/08/15 Service Provided on Last Visit: Med Refill  Controlled Substance Pharmacotherapy Assessment & REMS (Risk Evaluation and Mitigation Strategy)  Analgesic: Hydrocodone/APAP 10/325 one tablet by mouth every 6 hours (total: 40 mg/day) MME/day: 40 mg/day Pill Count: Did not bring medications today- states she is out- Instructed importance of bringing -  Even empty bottle. Pt will bring next time. Pharmacokinetics: Onset of action (Liberation/Absorption): Within expected pharmacological parameters Time to Peak effect (Distribution): Timing and results are as within normal expected parameters Duration of action (Metabolism/Excretion): Within normal limits for medication Pharmacodynamics: Analgesic Effect: More than 50% Activity Facilitation: Medication(s) allow patient to sit, stand, walk, and do the basic ADLs Perceived Effectiveness: Described as relatively effective, allowing for increase in activities of daily living (ADL) Side-effects or Adverse reactions: None reported Monitoring: [place] PMP: Online review of the past 61-month period conducted. Compliant with practice rules and regulations List of all UDS test(s) done:  Lab Results  Component Value Date   TOXASSSELUR FINAL 06/06/2015   TOXASSSELUR FINAL 03/06/2015   Last UDS on record: ToxAssure Select 13  Date Value Ref Range Status  06/06/2015 FINAL  Final    Comment:    ==================================================================== TOXASSURE SELECT 13  (MW) ==================================================================== Test  Result       Flag       Units Drug Present and Declared for Prescription Verification   Hydrocodone                    1374         EXPECTED   ng/mg creat   Hydromorphone                  543          EXPECTED   ng/mg creat   Dihydrocodeine                 313          EXPECTED   ng/mg creat   Norhydrocodone                 1244         EXPECTED   ng/mg creat    Sources of hydrocodone include scheduled prescription    medications. Hydromorphone, dihydrocodeine and norhydrocodone are    expected metabolites of hydrocodone. Hydromorphone and    dihydrocodeine are also available as scheduled prescription    medications. ==================================================================== Test                      Result    Flag   Units      Ref Range   Creatinine              154              mg/dL      >=20 ==================================================================== Declared Medications:  The flagging and interpretation on this report are based on the  following declared medications.  Unexpected results may arise from  inaccuracies in the declared medications.  **Note: The testing scope of this panel includes these medications:  Hydrocodone ==================================================================== For clinical consultation, please call [phone]. ====================================================================    UDS interpretation: Compliant          Medication Assessment Form: Reviewed. Patient indicates being compliant with therapy Treatment compliance: Compliant Risk Assessment: Aberrant Behavior: None observed today Substance Use Disorder (SUD) Risk Level: No change since last visit Risk of opioid abuse or dependence: 0.7-3.0% with doses ? 36 MME/day and 6.1-26% with doses ? 120 MME/day. Opioid Risk Tool (ORT) Score: 0   Low Risk for SUD (Score  <3) Depression Scale Score: PHQ-2: 0   No depression (0) PHQ-9: 0   No depression (0-4)  Pharmacologic Plan: No change in therapy, at this time  Laboratory Chemistry  Inflammation Markers Lab Results  Component Value Date   ESRSEDRATE 4 06/06/2015   CRP <0.5 06/06/2015   Renal Function Lab Results  Component Value Date   BUN 17 06/06/2015   CREATININE 0.71 06/06/2015   GFRAA >60 06/06/2015   GFRNONAA >60 06/06/2015   Hepatic Function Lab Results  Component Value Date   AST 28 06/06/2015   ALT 34 06/06/2015   ALBUMIN 4.7 06/06/2015   Electrolytes Lab Results  Component Value Date   NA 140 06/06/2015   K 3.6 06/06/2015   CL 104 06/06/2015   CALCIUM 9.8 06/06/2015   MG 2.0 06/06/2015   Pain Modulating Vitamins No results found for: [place], VD125OH2TOT, [id number], [id number], 25OHVITD1, 25OHVITD2, 25OHVITD3, VITAMINB12 Coagulation Parameters No results found for: INR, LABPROT, APTT, PLT Cardiovascular Lab Results  Component Value Date   HGB 15.6 (H) 10/11/2011   HCT 46.0 10/11/2011   Note: Lab results reviewed.  Recent  Diagnostic Imaging  US Thyroid  Result Date: 10/15/2015 CLINICAL DATA:  Follow-up thyroid nodules. EXAM: THYROID ULTRASOUND TECHNIQUE: Ultrasound examination of the thyroid gland and adjacent soft tissues was performed. COMPARISON:  Thyroid ultrasound - 09/27/2013 ; 10/04/2012; 02/26/2012; ultrasound-guided right-sided thyroid nodule biopsy - 03/30/2012 FINDINGS: Grossly unchanged diffuse heterogeneity of the thyroid parenchymal echotexture and. No definitive new or enlarging thyroid nodules. Right thyroid lobe Measurements: Slightly diminutive in size measuring 3.2 x 1.4 x 2.0 cm, previously, 5.1 x 1.9 x 1.7 cm. Right, mid, anterior - 1.2 x 1.0 x 2.0 cm - mixed echogenic, solid - grossly unchanged since the 02/2012 examination, previously, 2.0 x 0.9 x 1.4 cm. This nodule was previously biopsied. Left thyroid lobe Measurements: Slightly diminutive in  size measuring 3.3 x 1.6 x 1.8 cm, unchanged, previously, 5.4 x 2.0 x 1.4 cm. Left, superior - 0.5 cm - echogenic dystrophic calcification with posterior acoustic shadowing - unchanged since the 02/2012 examination Isthmus Thickness: Normal in size measuring 0.6 cm in diameter, unchanged. There is mild diffuse heterogeneity of the thyroid isthmus without discrete nodule or mass. Lymphadenopathy None visualized. IMPRESSION: 1. No new or enlarging thyroid nodules with all discretely measured thyroid nodules including previously biopsied dominant approximately 2 cm nodule within the right lobe of thyroid appearing unchanged since most remote examination performed 02/2012. 2. Nonspecific apparent mild interval atrophy of both the right and left lobes of the thyroid with the right lobe now measuring 3.2 cm in maximal diameter (previously, 5.1 cm) and the left lobe measuring 3.3 cm (previously, 5.4 cm). Correlation with thyroid function tests is recommended. Electronically Signed   By: Sandi Mariscal M.D.   On: 10/15/2015 15:26   Meds  The patient has a current medication list which includes the following prescription(s): amlodipine-benazepril, vitamin d3, doxycycline, estradiol-norethindrone, hydrochlorothiazide, hydrocodone-acetaminophen, hydrocodone-acetaminophen, hydrocodone-acetaminophen, levothyroxine, magnesium oxide, meloxicam, potassium chloride, prednisolone acetate, and benefiber.  Current Outpatient Prescriptions on File Prior to Visit  Medication Sig  . amLODipine-benazepril (LOTREL) 5-10 MG per capsule Take 1 capsule by mouth daily.  . Cholecalciferol (VITAMIN D3) 2000 UNITS capsule Take 1 capsule (2,000 Units total) by mouth daily.  [name] Kitchen doxycycline (DORYX) 100 MG DR capsule Take 100 mg by mouth 2 (two) times daily.  [name] Kitchen estradiol-norethindrone (ACTIVELLA) 1-0.5 MG per tablet Take 0.5 tablets by mouth daily.   . hydrochlorothiazide (HYDRODIURIL) 25 MG tablet Take 25 mg by mouth daily.  [name] Kitchen levothyroxine  (SYNTHROID, LEVOTHROID) 150 MCG tablet Take 150 mcg by mouth daily before breakfast.  . meloxicam (MOBIC) 15 MG tablet Take 15 mg by mouth daily.  . potassium chloride (KLOR-CON) 8 MEQ tablet Take 8 mEq by mouth daily.   No current facility-administered medications on file prior to visit.    ROS  Constitutional: Denies any fever or chills Gastrointestinal: No reported hemesis, hematochezia, vomiting, or acute GI distress Musculoskeletal: Denies any acute onset joint swelling, redness, loss of ROM, or weakness Neurological: No reported episodes of acute onset apraxia, aphasia, dysarthria, agnosia, amnesia, paralysis, loss of coordination, or loss of consciousness  Allergies  Alison Cox is allergic to penicillins; shellfish allergy; and sulfa antibiotics.  [place]  Medical:  Alison Cox  has a past medical history of History of Bell's palsy (03/06/2015); History of Hashimoto thyroiditis (03/06/2015); History of nephrolithiasis (03/06/2015); Hypertension; Kidney stone; Macular corneal dystrophy (2005); Osteoarthritis; and Thyroid disease. Family: family history includes Hypertension in her father and mother. Surgical:  has a past surgical history that includes Endometrial ablation; DILATATION AND CURETTAGE; Corneal transplant; and  Corneal transplant. Tobacco:  reports that she has never smoked. She has never used smokeless tobacco. Alcohol:  reports that she does not drink alcohol. Drug:  reports that she does not use drugs.  Constitutional Exam  General appearance: Well nourished, well developed, and well hydrated. In no acute distress Vitals:   01/31/16 0847  BP: (!) 168/71  Pulse: 96  Resp: 16  Temp: 98.1 F (36.7 C)  TempSrc: Oral  SpO2: 97%  Weight: 270 lb (122.5 kg)  Height: 5\' 2"  (1.575 m)  BMI Assessment: Estimated body mass index is 49.38 kg/m as calculated from the following:   Height as of this encounter: 5\' 2"  (1.575 m).   Weight as of this encounter: 270 lb (122.5 kg).    BMI interpretation: (>40 kg/m2) = Extreme obesity (Class III): This range is associated with a 254% higher incidence of chronic pain. BMI Readings from Last 4 Encounters:  01/31/16 49.38 kg/m  10/08/15 49.38 kg/m  06/06/15 47.83 kg/m  03/06/15 47.83 kg/m   Wt Readings from Last 4 Encounters:  01/31/16 270 lb (122.5 kg)  10/08/15 270 lb (122.5 kg)  06/06/15 270 lb (122.5 kg)  03/06/15 270 lb (122.5 kg)  Psych/Mental status: Alert and oriented x 3 (person, place, & time) Eyes: PERLA Respiratory: No evidence of acute respiratory distress  Cervical Spine Exam  Inspection: No masses, redness, or swelling Alignment: Symmetrical Functional ROM: Unrestricted ROM Stability: No instability detected Muscle strength & Tone: Functionally intact Sensory: Unimpaired Palpation: Non-contributory  Upper Extremity (UE) Exam    Side: Right upper extremity  Side: Left upper extremity  Inspection: No masses, redness, swelling, or asymmetry  Inspection: No masses, redness, swelling, or asymmetry  Functional ROM: Unrestricted ROM         Functional ROM: Unrestricted ROM          Muscle strength & Tone: Functionally intact  Muscle strength & Tone: Functionally intact  Sensory: Unimpaired  Sensory: Unimpaired  Palpation: Non-contributory  Palpation: Non-contributory   Thoracic Spine Exam  Inspection: No masses, redness, or swelling Alignment: Symmetrical Functional ROM: Unrestricted ROM Stability: No instability detected Sensory: Unimpaired Muscle strength & Tone: Functionally intact Palpation: Non-contributory  Lumbar Spine Exam  Inspection: No masses, redness, or swelling Alignment: Symmetrical Functional ROM: Unrestricted ROM Stability: No instability detected Muscle strength & Tone: Functionally intact Sensory: Unimpaired Palpation: Non-contributory Provocative Tests: Lumbar Hyperextension and rotation test: evaluation deferred today       Patrick's Maneuver: evaluation deferred  today              Gait & Posture Assessment  Ambulation: Unassisted Gait: Relatively normal for age and body habitus Posture: WNL   Lower Extremity Exam    Side: Right lower extremity  Side: Left lower extremity  Inspection: No masses, redness, swelling, or asymmetry  Inspection: No masses, redness, swelling, or asymmetry  Functional ROM: Unrestricted ROM          Functional ROM: Unrestricted ROM          Muscle strength & Tone: Functionally intact  Muscle strength & Tone: Functionally intact  Sensory: Unimpaired  Sensory: Unimpaired  Palpation: Non-contributory  Palpation: Non-contributory   Assessment  Primary Diagnosis & Pertinent Problem List: The primary encounter diagnosis was Long term prescription opiate use. Diagnoses of Chronic pain syndrome, Therapeutic opioid-induced constipation (OIC), and Opiate use (40 MME/Day) were also pertinent to this visit.  Visit Diagnosis: 1. Long term prescription opiate use   2. Chronic pain syndrome   3. Therapeutic  opioid-induced constipation (OIC)   4. Opiate use (40 MME/Day)    Plan of Care  Pharmacotherapy (Medications Ordered): Meds ordered this encounter  Medications  . HYDROcodone-acetaminophen (NORCO) 10-325 MG tablet    Sig: Take 1 tablet by mouth every 6 (six) hours as needed for severe pain.    Dispense:  120 tablet    Refill:  0    Do not place this medication, or any other prescription from our practice, on "Automatic Refill". Patient may have prescription filled one day early if pharmacy is closed on scheduled refill date. Do not fill until: 01/31/16 To last until: 03/01/16  . HYDROcodone-acetaminophen (NORCO) 10-325 MG tablet    Sig: Take 1 tablet by mouth every 6 (six) hours as needed for severe pain.    Dispense:  120 tablet    Refill:  0    Do not place this medication, or any other prescription from our practice, on "Automatic Refill". Patient may have prescription filled one day early if pharmacy is closed on  scheduled refill date. Do not fill until: 03/01/16 To last until: 03/31/16  . HYDROcodone-acetaminophen (NORCO) 10-325 MG tablet    Sig: Take 1 tablet by mouth every 6 (six) hours as needed for severe pain.    Dispense:  120 tablet    Refill:  0    Do not place this medication, or any other prescription from our practice, on "Automatic Refill". Patient may have prescription filled one day early if pharmacy is closed on scheduled refill date. Do not fill until: 03/31/16 To last until: 04/30/16  . Magnesium Oxide 500 MG CAPS    Sig: Take 1 capsule (500 mg total) by mouth daily.    Dispense:  90 capsule    Refill:  0    Do not place this medication, or any other prescription from our practice, on "Automatic Refill". Patient may have prescription filled one day early if pharmacy is closed on scheduled refill date.  . Wheat Dextrin (BENEFIBER) POWD    Sig: Stir 2 teaspoons of Benefiber into 4-8 oz of any non-carbonated beverage or soft food (hot or cold) TID.    Dispense:  500 g    Refill:  PRN    Do not place this medication, or any other prescription from our practice, on "Automatic Refill". Patient may have prescription filled one day early if pharmacy is closed on scheduled refill date.   New Prescriptions   No medications on file   Medications administered during this visit: Alison Cox had no medications administered during this visit. Lab-work, Procedure(s), & Referral(s) Ordered: Orders Placed This Encounter  Procedures  . ToxASSURE Select 13 (MW), Urine   Imaging & Referral(s) Ordered: None  Interventional Therapies: Scheduled:  None at this time. The patient's body habitus makes any interventional therapy technically challenging and decreases the chances of success. The patient has been informed that once her BMI goes down low 35, we will revisit and offer her other alternatives.   Considering:  None at this time. The patient's body habitus makes any interventional therapy  technically challenging and decreases the chances of success. The patient has been informed that once her BMI goes down low 35, we will revisit and offer her other alternatives.   PRN Procedures:  None at this time. The patient's body habitus makes any interventional therapy technically challenging and decreases the chances of success. The patient has been informed that once her BMI goes down low 35, we will revisit and offer her  other alternatives.   Requested PM Follow-up: No Follow-up on file.  Future Appointments Date Time Provider [place]  05/01/2016 9:30 AM Milinda Pointer, MD [facility] None   Primary Care Physician: Mathews Argyle, MD Location: [facility] Outpatient Pain Management Facility Note by: Kathlen Brunswick. Dossie Arbour, M.D, DABA, DABAPM, DABPM, DABIPP, FIPP  Pain Score Disclaimer: We use the NRS-11 scale. This is a self-reported, subjective measurement of pain severity with only modest accuracy. It is used primarily to identify changes within a particular patient. It must be understood that outpatient pain scales are significantly less accurate that those used for research, where they can be applied under ideal controlled circumstances with minimal exposure to variables. In reality, the score is likely to be a combination of pain intensity and pain affect, where pain affect describes the degree of emotional arousal or changes in action readiness caused by the sensory experience of pain. Factors such as social and work situation, setting, emotional state, anxiety levels, expectation, and prior pain experience may influence pain perception and show large inter-individual differences that may also be affected by time variables.  Patient instructions provided during this appointment: There are no Patient Instructions on file for this visit.

## 2016-01-31 NOTE — Progress Notes (Signed)
Safety precautions to be maintained throughout the outpatient stay will include: orient to surroundings, keep bed in low position, maintain call bell within reach at all times, provide assistance with transfer out of bed and ambulation.   Did not bring medications today- states she is out- Instructed importance of bringing -  Even empty bottle. Pt will bring next time

## 2016-02-07 LAB — TOXASSURE SELECT 13 (MW), URINE

## 2016-03-12 ENCOUNTER — Other Ambulatory Visit: Payer: Self-pay | Admitting: Pain Medicine

## 2016-03-12 DIAGNOSIS — E559 Vitamin D deficiency, unspecified: Secondary | ICD-10-CM

## 2016-05-01 ENCOUNTER — Ambulatory Visit: Payer: 59 | Attending: Pain Medicine | Admitting: Pain Medicine

## 2016-05-01 ENCOUNTER — Encounter: Payer: Self-pay | Admitting: Pain Medicine

## 2016-05-01 ENCOUNTER — Other Ambulatory Visit
Admission: RE | Admit: 2016-05-01 | Discharge: 2016-05-01 | Disposition: A | Discharge status: Home / Self Care | Payer: 59 | Source: Ambulatory Visit | Attending: Pain Medicine | Admitting: Pain Medicine

## 2016-05-01 VITALS — BP 147/80 | HR 91 | Temp 97.5°F | Resp 20 | Ht 63.0 in | Wt 280.0 lb

## 2016-05-01 DIAGNOSIS — E063 Autoimmune thyroiditis: Secondary | ICD-10-CM | POA: Diagnosis not present

## 2016-05-01 DIAGNOSIS — G894 Chronic pain syndrome: Secondary | ICD-10-CM | POA: Diagnosis not present

## 2016-05-01 DIAGNOSIS — Z6841 Body Mass Index (BMI) 40.0 and over, adult: Secondary | ICD-10-CM | POA: Insufficient documentation

## 2016-05-01 DIAGNOSIS — M47817 Spondylosis without myelopathy or radiculopathy, lumbosacral region: Secondary | ICD-10-CM | POA: Diagnosis not present

## 2016-05-01 DIAGNOSIS — K5900 Constipation, unspecified: Secondary | ICD-10-CM | POA: Insufficient documentation

## 2016-05-01 DIAGNOSIS — M170 Bilateral primary osteoarthritis of knee: Secondary | ICD-10-CM | POA: Insufficient documentation

## 2016-05-01 DIAGNOSIS — E559 Vitamin D deficiency, unspecified: Secondary | ICD-10-CM | POA: Diagnosis not present

## 2016-05-01 DIAGNOSIS — M25571 Pain in right ankle and joints of right foot: Secondary | ICD-10-CM

## 2016-05-01 DIAGNOSIS — G510 Bell's palsy: Secondary | ICD-10-CM | POA: Insufficient documentation

## 2016-05-01 DIAGNOSIS — E042 Nontoxic multinodular goiter: Secondary | ICD-10-CM | POA: Insufficient documentation

## 2016-05-01 DIAGNOSIS — M25572 Pain in left ankle and joints of left foot: Secondary | ICD-10-CM | POA: Diagnosis not present

## 2016-05-01 DIAGNOSIS — M25562 Pain in left knee: Secondary | ICD-10-CM | POA: Diagnosis not present

## 2016-05-01 DIAGNOSIS — Z79891 Long term (current) use of opiate analgesic: Secondary | ICD-10-CM | POA: Insufficient documentation

## 2016-05-01 DIAGNOSIS — M25561 Pain in right knee: Secondary | ICD-10-CM | POA: Insufficient documentation

## 2016-05-01 DIAGNOSIS — G8929 Other chronic pain: Secondary | ICD-10-CM

## 2016-05-01 DIAGNOSIS — M25552 Pain in left hip: Secondary | ICD-10-CM | POA: Diagnosis not present

## 2016-05-01 DIAGNOSIS — M545 Low back pain: Secondary | ICD-10-CM | POA: Diagnosis not present

## 2016-05-01 DIAGNOSIS — M5137 Other intervertebral disc degeneration, lumbosacral region: Secondary | ICD-10-CM | POA: Diagnosis not present

## 2016-05-01 DIAGNOSIS — F1190 Opioid use, unspecified, uncomplicated: Secondary | ICD-10-CM

## 2016-05-01 LAB — COMPREHENSIVE METABOLIC PANEL
ALT: 47 U/L (ref 14–54)
ANION GAP: 9 (ref 5–15)
AST: 37 U/L (ref 15–41)
Albumin: 4.6 g/dL (ref 3.5–5.0)
Alkaline Phosphatase: 93 U/L (ref 38–126)
BUN: 18 mg/dL (ref 6–20)
CHLORIDE: 106 mmol/L (ref 101–111)
CO2: 26 mmol/L (ref 22–32)
Calcium: 10 mg/dL (ref 8.9–10.3)
Creatinine, Ser: 0.82 mg/dL (ref 0.44–1.00)
GFR calc non Af Amer: >60 mL/min (ref >60)
Glucose, Bld: 90 mg/dL (ref 65–99)
Potassium: 4 mmol/L (ref 3.5–5.1)
SODIUM: 141 mmol/L (ref 135–145)
Total Bilirubin: 0.6 mg/dL (ref 0.3–1.2)
Total Protein: 8.1 g/dL (ref 6.5–8.1)

## 2016-05-01 LAB — MAGNESIUM: MAGNESIUM: 2 mg/dL (ref 1.7–2.4)

## 2016-05-01 LAB — VITAMIN B12: Vitamin B-12: 302 pg/mL (ref 180–914)

## 2016-05-01 LAB — C-REACTIVE PROTEIN: CRP: <0.8 mg/dL (ref <1.0)

## 2016-05-01 LAB — SEDIMENTATION RATE: SED RATE: 8 mm/h (ref 0–30)

## 2016-05-01 MED ORDER — HYDROCODONE-ACETAMINOPHEN 10-325 MG PO TABS: 1.0000 | ORAL_TABLET | Freq: Four times a day (QID) | ORAL | 0 refills | Status: DC | PRN

## 2016-05-01 NOTE — Progress Notes (Signed)
Patient's Name: Alison Cox  MRN: [id number]  Referring Provider: Lajean Manes, MD  DOB: 17-Oct-1955  PCP: Alison Manes, MD  DOS: 05/01/2016  Note by: Kathlen Brunswick. Dossie Arbour, MD  Service setting: Ambulatory outpatient  Specialty: Interventional Pain Management  Location: [facility] (AMB) Pain Management Facility    Patient type: Established   Primary Reason(s) for Visit: Encounter for prescription drug management (Level of risk: moderate) CC: Knee Pain (bilateral) and Ankle Pain (bilateral, right is worse)  HPI  Alison Cox is a 61 y.o. year old, female patient, who comes today for a medication management evaluation. 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; Long term current use of opiate analgesic; Long term prescription opiate use; Opiate use (40 MME/Day); Encounter for therapeutic drug level monitoring; Opioid dependence ([name]); Lumbar spondylosis; Grade 1 Anterolisthesis of L4 over L5; Therapeutic opioid-induced constipation (OIC); Chronic knee pain (Location of Primary Source of Pain) (Bilateral) (R>L); Osteoarthritis of knees (Location of Primary Source of Pain) (Bilateral) (R>L); Vitamin D insufficiency; History of Bell's palsy (left-sided); History of nephrolithiasis; History of Hashimoto thyroiditis; Osteoarthrosis; Obesity, Class III, BMI 40-49.9 (morbid obesity) (HCC) (254% higher incidence of chronic low back pain); Spondylolysis of lumbosacral region (Advanced disc degeneration and spondylosis L5-S1); Chronic ankle pain (Location of Secondary source of pain) (Bilateral) (L>R); Osteoarthritis of ankles (Location of Secondary source of pain) (Bilateral) (L>R); Chronic low back pain (Location of Tertiary source of pain) (midline); Chronic hip pain (Left); and Chronic pain syndrome on her problem list. Her primarily concern today is the Knee Pain (bilateral) and Ankle Pain (bilateral, right is worse)  Pain  Assessment: Self-Reported Pain Score: 2 /10             Reported level is compatible with observation.       Pain Location: Knee (and ankles) Pain Orientation: Right, Left Pain Descriptors / Indicators: Aching Pain Frequency: Intermittent  Alison Cox was last seen on 03/12/2016 for medication management. During today's appointment we reviewed Alison Cox's chronic pain status, as well as her outpatient medication regimen.  The patient  reports that she does not use drugs. Her body mass index is 49.6 kg/m.  Further details on both, my assessment(s), as well as the proposed treatment plan, please see below.  Controlled Substance Pharmacotherapy Assessment REMS (Risk Evaluation and Mitigation Strategy)  Analgesic:Hydrocodone/APAP 10/325 one tablet by mouth every 6 hours (total: 40 mg/day) MME/day:40 mg/day Angelique Holm, RN  05/01/2016  9:41 AM  Sign at close encounter Nursing Pain Medication Assessment:  Safety precautions to be maintained throughout the outpatient stay will include: orient to surroundings, keep bed in low position, maintain call bell within reach at all times, provide assistance with transfer out of bed and ambulation.  Medication Inspection Compliance: Pill count conducted under aseptic conditions, in front of the patient. Neither the pills nor the bottle was removed from the patient's sight at any time. Once count was completed pills were immediately returned to the patient in their original bottle.  Medication: See above Pill Count: 0 of 120 pills remain Bottle Appearance: Standard pharmacy container. Clearly labeled. Filled Date: 9 / 11 / 2017 Medication last intake: 05-01-2016 at 0600   Pharmacokinetics: Liberation and absorption (onset of action): WNL Distribution (time to peak effect): WNL Metabolism and excretion (duration of action): WNL         Pharmacodynamics: Desired effects: Analgesia: Alison Cox reports >50% benefit. Functional ability: Patient  reports that medication allows her to accomplish  basic ADLs Clinically meaningful improvement in function (CMIF): Sustained CMIF goals met Perceived effectiveness: Described as relatively effective, allowing for increase in activities of daily living (ADL) Undesirable effects: Side-effects or Adverse reactions: None reported Monitoring: [place] PMP: Online review of the past 74-monthperiod conducted. Compliant with practice rules and regulations List of all UDS test(s) done:  Lab Results  Component Value Date   TOXASSSELUR FINAL 01/31/2016   TOXASSSELUR FINAL 06/06/2015   TOXASSSELUR FINAL 03/06/2015   Last UDS on record: ToxAssure Select 13  Date Value Ref Range Status  01/31/2016 FINAL  Final    Comment:    ==================================================================== TOXASSURE SELECT 13 (MW) ==================================================================== Test                             Result       Flag       Units Drug Present and Declared for Prescription Verification   Hydrocodone                    2277         EXPECTED   ng/mg creat   Hydromorphone                  465          EXPECTED   ng/mg creat   Dihydrocodeine                 546          EXPECTED   ng/mg creat   Norhydrocodone                 1474         EXPECTED   ng/mg creat    Sources of hydrocodone include scheduled prescription    medications. Hydromorphone, dihydrocodeine and norhydrocodone are    expected metabolites of hydrocodone. Hydromorphone and    dihydrocodeine are also available as scheduled prescription    medications. ==================================================================== Test                      Result    Flag   Units      Ref Range   Creatinine              246              mg/dL      >=20 ==================================================================== Declared Medications:  The flagging and interpretation on this report are based on the  following declared  medications.  Unexpected results may arise from  inaccuracies in the declared medications.  **Note: The testing scope of this panel includes these medications:  Hydrocodone (Norco)  **Note: The testing scope of this panel does not include following  reported medications:  Acetaminophen (Norco)  Amlodipine  Benazepril  Doxycycline  Estradiol  Hydrochlorothiazide  Levothyroxine (Synthroid)  Magnesium Oxide  Meloxicam (Mobic)  Norethindrone  Potassium (Klor-Con)  Prednisolone  Supplement  Vitamin D3 ==================================================================== For clinical consultation, please call ([phone] ====================================================================    UDS interpretation: Compliant          Medication Assessment Form: Reviewed. Patient indicates being compliant with therapy Treatment compliance: Compliant Risk Assessment Profile: Aberrant behavior: See prior evaluations. None observed or detected today Comorbid factors increasing risk of overdose: See prior notes. No additional risks detected today Risk of substance use disorder (SUD): Low Opioid Risk Tool (ORT) Total Score: 0  Interpretation Table:  Score <3 = Low Risk  for SUD  Score between 4-7 = Moderate Risk for SUD  Score >8 = High Risk for Opioid Abuse   Risk Mitigation Strategies:  Patient Counseling: Covered Patient-Prescriber Agreement (PPA): Present and active  Notification to other healthcare providers: Done  Pharmacologic Plan: No change in therapy, at this time  Laboratory Chemistry  Inflammation Markers Lab Results  Component Value Date   ESRSEDRATE 8 05/01/2016   CRP <0.8 05/01/2016   Renal Function Lab Results  Component Value Date   BUN 18 05/01/2016   CREATININE 0.82 05/01/2016   GFRAA >60 05/01/2016   GFRNONAA >60 05/01/2016   Hepatic Function Lab Results  Component Value Date   AST 37 05/01/2016   ALT 47 05/01/2016   ALBUMIN 4.6 05/01/2016    Electrolytes Lab Results  Component Value Date   NA 141 05/01/2016   K 4.0 05/01/2016   CL 106 05/01/2016   CALCIUM 10.0 05/01/2016   MG 2.0 05/01/2016   Pain Modulating Vitamins Lab Results  Component Value Date   VITAMINB12 302 05/01/2016   Coagulation Parameters No results found for: INR, LABPROT, APTT, PLT Cardiovascular Lab Results  Component Value Date   HGB 15.6 (H) 10/11/2011   HCT 46.0 10/11/2011   Note: Lab results reviewed.  Recent Diagnostic Imaging Review  US Thyroid  Result Date: 10/15/2015 CLINICAL DATA:  Follow-up thyroid nodules. EXAM: THYROID ULTRASOUND TECHNIQUE: Ultrasound examination of the thyroid gland and adjacent soft tissues was performed. COMPARISON:  Thyroid ultrasound - 09/27/2013 ; 10/04/2012; 02/26/2012; ultrasound-guided right-sided thyroid nodule biopsy - 03/30/2012 FINDINGS: Grossly unchanged diffuse heterogeneity of the thyroid parenchymal echotexture and. No definitive new or enlarging thyroid nodules. Right thyroid lobe Measurements: Slightly diminutive in size measuring 3.2 x 1.4 x 2.0 cm, previously, 5.1 x 1.9 x 1.7 cm. Right, mid, anterior - 1.2 x 1.0 x 2.0 cm - mixed echogenic, solid - grossly unchanged since the 02/2012 examination, previously, 2.0 x 0.9 x 1.4 cm. This nodule was previously biopsied. Left thyroid lobe Measurements: Slightly diminutive in size measuring 3.3 x 1.6 x 1.8 cm, unchanged, previously, 5.4 x 2.0 x 1.4 cm. Left, superior - 0.5 cm - echogenic dystrophic calcification with posterior acoustic shadowing - unchanged since the 02/2012 examination Isthmus Thickness: Normal in size measuring 0.6 cm in diameter, unchanged. There is mild diffuse heterogeneity of the thyroid isthmus without discrete nodule or mass. Lymphadenopathy None visualized. IMPRESSION: 1. No new or enlarging thyroid nodules with all discretely measured thyroid nodules including previously biopsied dominant approximately 2 cm nodule within the right lobe of  thyroid appearing unchanged since most remote examination performed 02/2012. 2. Nonspecific apparent mild interval atrophy of both the right and left lobes of the thyroid with the right lobe now measuring 3.2 cm in maximal diameter (previously, 5.1 cm) and the left lobe measuring 3.3 cm (previously, 5.4 cm). Correlation with thyroid function tests is recommended. Electronically Signed   By: Sandi Mariscal M.D.   On: 10/15/2015 15:26   Note: Imaging results reviewed.          Meds  The patient has a current medication list which includes the following prescription(s): amlodipine-benazepril, vitamin d3, doxycycline, hydrochlorothiazide, hydrocodone-acetaminophen, hydrocodone-acetaminophen, hydrocodone-acetaminophen, levothyroxine, meloxicam, potassium chloride, prednisolone acetate, magnesium oxide, and benefiber.  Current Outpatient Prescriptions on File Prior to Visit  Medication Sig  . amLODipine-benazepril (LOTREL) 5-10 MG per capsule Take 1 capsule by mouth daily.  . Cholecalciferol (VITAMIN D3) 2000 UNITS capsule Take 1 capsule (2,000 Units total) by mouth daily.  [name] Kitchen doxycycline (  DORYX) 100 MG DR capsule Take 100 mg by mouth 2 (two) times daily.  . hydrochlorothiazide (HYDRODIURIL) 25 MG tablet Take 25 mg by mouth daily.  [name] Kitchen levothyroxine (SYNTHROID, LEVOTHROID) 150 MCG tablet Take 150 mcg by mouth daily before breakfast.  . meloxicam (MOBIC) 15 MG tablet Take 15 mg by mouth daily.  . potassium chloride (KLOR-CON) 8 MEQ tablet Take 8 mEq by mouth daily.  . prednisoLONE acetate (PRED FORTE) 1 % ophthalmic suspension Place 1 drop into the right eye every 2 (two) hours while awake.  . Magnesium Oxide 500 MG CAPS Take 1 capsule (500 mg total) by mouth daily.  . Wheat Dextrin (BENEFIBER) POWD Stir 2 teaspoons of Benefiber into 4-8 oz of any non-carbonated beverage or soft food (hot or cold) TID.   No current facility-administered medications on file prior to visit.    ROS  Constitutional: Denies any  fever or chills Gastrointestinal: No reported hemesis, hematochezia, vomiting, or acute GI distress Musculoskeletal: Denies any acute onset joint swelling, redness, loss of ROM, or weakness Neurological: No reported episodes of acute onset apraxia, aphasia, dysarthria, agnosia, amnesia, paralysis, loss of coordination, or loss of consciousness  Allergies  Alison Cox is allergic to penicillins; shellfish allergy; and sulfa antibiotics.  [place]  Drug: Alison Cox  reports that she does not use drugs. Alcohol:  reports that she does not drink alcohol. Tobacco:  reports that she has never smoked. She has never used smokeless tobacco. Medical:  has a past medical history of History of Bell's palsy (03/06/2015); History of Hashimoto thyroiditis (03/06/2015); History of nephrolithiasis (03/06/2015); Hypertension; Kidney stone; Macular corneal dystrophy (2005); Osteoarthritis; and Thyroid disease. Family: family history includes Hypertension in her father and mother.  Past Surgical History:  Procedure Laterality Date  . CORNEAL TRANSPLANT    . CORNEAL TRANSPLANT    . DILATATION AND CURETTAGE    . ENDOMETRIAL ABLATION     Constitutional Exam  General appearance: Well nourished, well developed, and well hydrated. In no apparent acute distress Vitals:   05/01/16 0942  BP: (!) 147/80  Pulse: 91  Resp: 20  Temp: 97.5 F (36.4 C)  TempSrc: Oral  SpO2: 98%  Weight: 280 lb (127 kg)  Height: _0  (1.6 m)   BMI Assessment: Estimated body mass index is 49.6 kg/m as calculated from the following:   Height as of this encounter: _1  (1.6 m).   Weight as of this encounter: 280 lb (127 kg).  BMI interpretation table: BMI level Category Range association with higher incidence of chronic pain  <18 kg/m2 Underweight   18.5-24.9 kg/m2 Ideal body weight   25-29.9 kg/m2 Overweight Increased incidence by 20%  30-34.9 kg/m2 Obese (Class I) Increased incidence by 68%  35-39.9 kg/m2 Severe obesity (Class  II) Increased incidence by 136%  >40 kg/m2 Extreme obesity (Class III) Increased incidence by 254%   BMI Readings from Last 4 Encounters:  05/01/16 49.60 kg/m  01/31/16 49.38 kg/m  10/08/15 49.38 kg/m  06/06/15 47.83 kg/m   Wt Readings from Last 4 Encounters:  05/01/16 280 lb (127 kg)  01/31/16 270 lb (122.5 kg)  10/08/15 270 lb (122.5 kg)  06/06/15 270 lb (122.5 kg)  Psych/Mental status: Alert, oriented x 3 (person, place, & time) Eyes: PERLA Respiratory: No evidence of acute respiratory distress  Cervical Spine Exam  Inspection: No masses, redness, or swelling Alignment: Symmetrical Functional ROM: Unrestricted ROM Stability: No instability detected Muscle strength & Tone: Functionally intact Sensory: Unimpaired Palpation: Non-contributory  Upper  Extremity (UE) Exam    Side: Right upper extremity  Side: Left upper extremity  Inspection: No masses, redness, swelling, or asymmetry  Inspection: No masses, redness, swelling, or asymmetry  Functional ROM: Unrestricted ROM          Functional ROM: Unrestricted ROM          Muscle strength & Tone: Functionally intact  Muscle strength & Tone: Functionally intact  Sensory: Unimpaired  Sensory: Unimpaired  Palpation: Non-contributory  Palpation: Non-contributory   Thoracic Spine Exam  Inspection: No masses, redness, or swelling Alignment: Symmetrical Functional ROM: Unrestricted ROM Stability: No instability detected Sensory: Unimpaired Muscle strength & Tone: Functionally intact Palpation: Non-contributory  Lumbar Spine Exam  Inspection: No masses, redness, or swelling Alignment: Symmetrical Functional ROM: Unrestricted ROM Stability: No instability detected Muscle strength & Tone: Functionally intact Sensory: Unimpaired Palpation: Non-contributory Provocative Tests: Lumbar Hyperextension and rotation test: evaluation deferred today       Patrick's Maneuver: evaluation deferred today              Gait & Posture  Assessment  Ambulation: Unassisted Gait: Relatively normal for age and body habitus Posture: WNL   Lower Extremity Exam    Side: Right lower extremity  Side: Left lower extremity  Inspection: No masses, redness, swelling, or asymmetry  Inspection: No masses, redness, swelling, or asymmetry  Functional ROM: Unrestricted ROM          Functional ROM: Unrestricted ROM          Muscle strength & Tone: Functionally intact  Muscle strength & Tone: Functionally intact  Sensory: Unimpaired  Sensory: Unimpaired  Palpation: Non-contributory  Palpation: Non-contributory   Assessment  Primary Diagnosis & Pertinent Problem List: The primary encounter diagnosis was Chronic pain syndrome. Diagnoses of Chronic knee pain (Location of Primary Source of Pain) (Bilateral) (R>L), Chronic pain of both ankles, Chronic low back pain (Location of Tertiary source of pain) (midline), Long term prescription opiate use, Opiate use (40 MME/Day), and Obesity, Class III, BMI 40-49.9 (morbid obesity) (HCC) (254% higher incidence of chronic low back pain) were also pertinent to this visit.  Status Diagnosis  Stable Stable Stable 1. Chronic pain syndrome   2. Chronic knee pain (Location of Primary Source of Pain) (Bilateral) (R>L)   3. Chronic pain of both ankles   4. Chronic low back pain (Location of Tertiary source of pain) (midline)   5. Long term prescription opiate use   6. Opiate use (40 MME/Day)   7. Obesity, Class III, BMI 40-49.9 (morbid obesity) (HCC) (254% higher incidence of chronic low back pain)      Plan of Care  Pharmacotherapy (Medications Ordered): Meds ordered this encounter  Medications  . HYDROcodone-acetaminophen (NORCO) 10-325 MG tablet    Sig: Take 1 tablet by mouth every 6 (six) hours as needed for severe pain.    Dispense:  120 tablet    Refill:  0    Do not place this medication, or any other prescription from our practice, on "Automatic Refill". Patient may have prescription filled one  day early if pharmacy is closed on scheduled refill date. Do not fill until: 05/01/16 To last until: 05/31/16  . HYDROcodone-acetaminophen (NORCO) 10-325 MG tablet    Sig: Take 1 tablet by mouth every 6 (six) hours as needed for severe pain.    Dispense:  120 tablet    Refill:  0    Do not place this medication, or any other prescription from our practice, on "Automatic Refill". Patient  may have prescription filled one day early if pharmacy is closed on scheduled refill date. Do not fill until: 05/31/16 To last until: 06/30/16  . HYDROcodone-acetaminophen (NORCO) 10-325 MG tablet    Sig: Take 1 tablet by mouth every 6 (six) hours as needed for severe pain.    Dispense:  120 tablet    Refill:  0    Do not place this medication, or any other prescription from our practice, on "Automatic Refill". Patient may have prescription filled one day early if pharmacy is closed on scheduled refill date. Do not fill until: 06/30/16 To last until: 07/30/16   New Prescriptions   No medications on file   Medications administered today: Alison Cox had no medications administered during this visit. Lab-work, procedure(s), and/or referral(s): Orders Placed This Encounter  Procedures  . Comprehensive metabolic panel  . C-reactive protein  . Magnesium  . Sedimentation rate  . Vitamin B12  . 25-Hydroxyvitamin D Lcms D2+D3   Imaging and/or referral(s): None  Interventional therapies: Planned, scheduled, and/or pending:   None at this time. The patient's body habitus makes any interventional therapy technically challenging and decreases the chances of success. The patient has been informed that once her BMI goes down low 35, we will revisit and offer her other alternatives.   Considering:   None at this time. The patient's body habitus makes any interventional therapy technically challenging and decreases the chances of success. The patient has been informed that once her BMI goes down low 35, we will  revisit and offer her other alternatives.   Palliative PRN treatment(s):   None at this time. The patient's body habitus makes any interventional therapy technically challenging and decreases the chances of success. The patient has been informed that once her BMI goes down low 35, we will revisit and offer her other alternatives.   Provider-requested follow-up: Return in about 3 months (around 07/30/2016) for (MD) Med-Mgmt.  Future Appointments Date Time Provider [place]  07/29/2016 9:45 AM Milinda Pointer, MD [facility] None   Primary Care Physician: Alison Manes, MD Location: [facility] Note by: Kathlen Brunswick. Dossie Arbour, M.D, DABA, DABAPM, DABPM, DABIPP, FIPP Date: 05/02/16; Time: 12:27 PM  Pain Score Disclaimer: We use the NRS-11 scale. This is a self-reported, subjective measurement of pain severity with only modest accuracy. It is used primarily to identify changes within a particular patient. It must be understood that outpatient pain scales are significantly less accurate that those used for research, where they can be applied under ideal controlled circumstances with minimal exposure to variables. In reality, the score is likely to be a combination of pain intensity and pain affect, where pain affect describes the degree of emotional arousal or changes in action readiness caused by the sensory experience of pain. Factors such as social and work situation, setting, emotional state, anxiety levels, expectation, and prior pain experience may influence pain perception and show large inter-individual differences that may also be affected by time variables.  Patient instructions provided during this appointment: There are no Patient Instructions on file for this visit.

## 2016-05-01 NOTE — Progress Notes (Signed)
Nursing Pain Medication Assessment:  Safety precautions to be maintained throughout the outpatient stay will include: orient to surroundings, keep bed in low position, maintain call bell within reach at all times, provide assistance with transfer out of bed and ambulation.  Medication Inspection Compliance: Pill count conducted under aseptic conditions, in front of the patient. Neither the pills nor the bottle was removed from the patient's sight at any time. Once count was completed pills were immediately returned to the patient in their original bottle.  Medication: See above Pill Count: 0 of 120 pills remain Bottle Appearance: Standard pharmacy container. Clearly labeled. Filled Date: 41 / 11 / 2017 Medication last intake: 05-01-2016 at 0600

## 2016-05-04 LAB — 25-HYDROXYVITAMIN D LCMS D2+D3
25-HYDROXY, VITAMIN D-2: 2.2 ng/mL
25-HYDROXY, VITAMIN D-3: 30 ng/mL
25-HYDROXY, VITAMIN D: 32 ng/mL

## 2016-07-29 ENCOUNTER — Encounter: Payer: Self-pay | Admitting: Pain Medicine

## 2016-07-29 ENCOUNTER — Ambulatory Visit: Payer: 59 | Attending: Pain Medicine | Admitting: Pain Medicine

## 2016-07-29 VITALS — BP 134/64 | HR 85 | Temp 98.4°F | Resp 18 | Ht 63.0 in | Wt 280.0 lb

## 2016-07-29 DIAGNOSIS — E063 Autoimmune thyroiditis: Secondary | ICD-10-CM | POA: Diagnosis not present

## 2016-07-29 DIAGNOSIS — E039 Hypothyroidism, unspecified: Secondary | ICD-10-CM | POA: Insufficient documentation

## 2016-07-29 DIAGNOSIS — G8929 Other chronic pain: Secondary | ICD-10-CM | POA: Diagnosis not present

## 2016-07-29 DIAGNOSIS — G510 Bell's palsy: Secondary | ICD-10-CM | POA: Diagnosis not present

## 2016-07-29 DIAGNOSIS — T402X5A Adverse effect of other opioids, initial encounter: Secondary | ICD-10-CM | POA: Insufficient documentation

## 2016-07-29 DIAGNOSIS — Z947 Corneal transplant status: Secondary | ICD-10-CM | POA: Diagnosis not present

## 2016-07-29 DIAGNOSIS — M545 Low back pain: Secondary | ICD-10-CM

## 2016-07-29 DIAGNOSIS — M25562 Pain in left knee: Secondary | ICD-10-CM | POA: Diagnosis not present

## 2016-07-29 DIAGNOSIS — M25572 Pain in left ankle and joints of left foot: Secondary | ICD-10-CM | POA: Insufficient documentation

## 2016-07-29 DIAGNOSIS — G894 Chronic pain syndrome: Secondary | ICD-10-CM | POA: Insufficient documentation

## 2016-07-29 DIAGNOSIS — Z6841 Body Mass Index (BMI) 40.0 and over, adult: Secondary | ICD-10-CM | POA: Diagnosis not present

## 2016-07-29 DIAGNOSIS — M7918 Myalgia, other site: Secondary | ICD-10-CM

## 2016-07-29 DIAGNOSIS — Z79891 Long term (current) use of opiate analgesic: Secondary | ICD-10-CM

## 2016-07-29 DIAGNOSIS — M4306 Spondylolysis, lumbar region: Secondary | ICD-10-CM | POA: Insufficient documentation

## 2016-07-29 DIAGNOSIS — M25571 Pain in right ankle and joints of right foot: Secondary | ICD-10-CM | POA: Diagnosis not present

## 2016-07-29 DIAGNOSIS — M170 Bilateral primary osteoarthritis of knee: Secondary | ICD-10-CM | POA: Diagnosis not present

## 2016-07-29 DIAGNOSIS — M25561 Pain in right knee: Secondary | ICD-10-CM | POA: Diagnosis not present

## 2016-07-29 DIAGNOSIS — M791 Myalgia: Secondary | ICD-10-CM | POA: Insufficient documentation

## 2016-07-29 DIAGNOSIS — I10 Essential (primary) hypertension: Secondary | ICD-10-CM | POA: Diagnosis not present

## 2016-07-29 DIAGNOSIS — F1190 Opioid use, unspecified, uncomplicated: Secondary | ICD-10-CM | POA: Diagnosis not present

## 2016-07-29 DIAGNOSIS — K5900 Constipation, unspecified: Secondary | ICD-10-CM | POA: Insufficient documentation

## 2016-07-29 DIAGNOSIS — K5903 Drug induced constipation: Secondary | ICD-10-CM

## 2016-07-29 DIAGNOSIS — E559 Vitamin D deficiency, unspecified: Secondary | ICD-10-CM | POA: Insufficient documentation

## 2016-07-29 DIAGNOSIS — M25552 Pain in left hip: Secondary | ICD-10-CM | POA: Diagnosis not present

## 2016-07-29 DIAGNOSIS — E042 Nontoxic multinodular goiter: Secondary | ICD-10-CM | POA: Diagnosis not present

## 2016-07-29 MED ORDER — BENEFIBER PO POWD: ORAL | 99 refills | Status: DC

## 2016-07-29 MED ORDER — HYDROCODONE-ACETAMINOPHEN 10-325 MG PO TABS: 1.0000 | ORAL_TABLET | Freq: Four times a day (QID) | ORAL | 0 refills | Status: DC | PRN

## 2016-07-29 MED ORDER — MAGNESIUM OXIDE -MG SUPPLEMENT 500 MG PO CAPS: 1.0000 | ORAL_CAPSULE | Freq: Every day | ORAL | 0 refills | Status: DC

## 2016-07-29 NOTE — Progress Notes (Signed)
Patient's Name: Alison Cox  MRN: [id number]  Referring Provider: Lajean Manes, MD  DOB: 10-31-1955  PCP: Lajean Manes, MD  DOS: 07/29/2016  Note by: Kathlen Brunswick. Dossie Arbour, MD  Service setting: Ambulatory outpatient  Specialty: Interventional Pain Management  Location: [facility] (AMB) Pain Management Facility    Patient type: Established   Primary Reason(s) for Visit: Encounter for prescription drug management (Level of risk: moderate) CC: Knee Pain (bilateral)  HPI  Alison Cox is a 61 y.o. year old, female patient, who comes today for a medication management evaluation. 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; Long term current use of opiate analgesic; Long term prescription opiate use; Opiate use (40 MME/Day); Encounter for therapeutic drug level monitoring; Opioid dependence ([place]); Lumbar spondylosis; Grade 1 Anterolisthesis of L4 over L5; Therapeutic opioid-induced constipation (OIC); Chronic knee pain (Location of Primary Source of Pain) (Bilateral) (R>L); Osteoarthritis of knees (Location of Primary Source of Pain) (Bilateral) (R>L); Vitamin D insufficiency; History of Bell's palsy (left-sided); History of nephrolithiasis; History of Hashimoto thyroiditis; Osteoarthrosis; Obesity, Class III, BMI 40-49.9 (morbid obesity) (HCC) (254% higher incidence of chronic low back pain); Spondylolysis of lumbosacral region (Advanced disc degeneration and spondylosis L5-S1); Osteoarthritis of ankles (Location of Secondary source of pain) (Bilateral) (L>R); Chronic low back pain (Location of Tertiary source of pain) (midline); Chronic hip pain (Left); Chronic pain syndrome; Musculoskeletal pain; and Chronic ankle pain (Location of Secondary source of pain) (Bilateral) (L>R) on her problem list. Her primarily concern today is the Knee Pain (bilateral)  Pain Assessment: Self-Reported Pain Score: 0-No pain/10             Reported level is  compatible with observation.       Pain Type: Chronic pain Pain Location: Knee Pain Orientation: Right, Left Pain Descriptors / Indicators: Aching Pain Frequency: Intermittent  Alison Cox was last scheduled for an appointment on 05/01/2016 for medication management. During today's appointment we reviewed Alison Cox's chronic pain status, as well as her outpatient medication regimen.  The patient  reports that she does not use drugs. Her body mass index is 49.6 kg/m.  Further details on both, my assessment(s), as well as the proposed treatment plan, please see below.  Controlled Substance Pharmacotherapy Assessment REMS (Risk Evaluation and Mitigation Strategy)  Analgesic:Hydrocodone/APAP 10/325 one tablet by mouth every 6 hours (total: 40 mg/day) MME/day:40 mg/day [name], RN  07/29/2016 10:35 AM  Sign at close encounter Nursing Pain Medication Assessment:  Safety precautions to be maintained throughout the outpatient stay will include: orient to surroundings, keep bed in low position, maintain call bell within reach at all times, provide assistance with transfer out of bed and ambulation.  Medication Inspection Compliance: Pill count conducted under aseptic conditions, in front of the patient. Neither the pills nor the bottle was removed from the patient's sight at any time. Once count was completed pills were immediately returned to the patient in their original bottle.  Medication: Hydrocodone/APAP Pill/Patch Count: 9 of 120 pills remain Pill/Patch Appearance: Markings consistent with prescribed medication Bottle Appearance: Standard pharmacy container. Clearly labeled. Filled Date: 03/12 / 2018 Last Medication intake:  Today   Pharmacokinetics: Liberation and absorption (onset of action): WNL Distribution (time to peak effect): WNL Metabolism and excretion (duration of action): WNL         Pharmacodynamics: Desired effects: Analgesia: Alison Cox reports >50%  benefit. Functional ability: Patient reports that medication allows her to accomplish basic ADLs Clinically meaningful  improvement in function (CMIF): Sustained CMIF goals met Perceived effectiveness: Described as relatively effective, allowing for increase in activities of daily living (ADL) Undesirable effects: Side-effects or Adverse reactions: None reported Monitoring: [place] PMP: Online review of the past 66-monthperiod conducted. Compliant with practice rules and regulations List of all UDS test(s) done:  Lab Results  Component Value Date   TOXASSSELUR FINAL 01/31/2016   TOXASSSELUR FINAL 06/06/2015   TOXASSSELUR FINAL 03/06/2015   Last UDS on record: ToxAssure Select 13  Date Value Ref Range Status  01/31/2016 FINAL  Final    Comment:    ==================================================================== TOXASSURE SELECT 13 (MW) ==================================================================== Test                             Result       Flag       Units Drug Present and Declared for Prescription Verification   Hydrocodone                    2277         EXPECTED   ng/mg creat   Hydromorphone                  465          EXPECTED   ng/mg creat   Dihydrocodeine                 546          EXPECTED   ng/mg creat   Norhydrocodone                 1474         EXPECTED   ng/mg creat    Sources of hydrocodone include scheduled prescription    medications. Hydromorphone, dihydrocodeine and norhydrocodone are    expected metabolites of hydrocodone. Hydromorphone and    dihydrocodeine are also available as scheduled prescription    medications. ==================================================================== Test                      Result    Flag   Units      Ref Range   Creatinine              246              mg/dL      >=20 ==================================================================== Declared Medications:  The flagging and interpretation on this report are  based on the  following declared medications.  Unexpected results may arise from  inaccuracies in the declared medications.  **Note: The testing scope of this panel includes these medications:  Hydrocodone (Norco)  **Note: The testing scope of this panel does not include following  reported medications:  Acetaminophen (Norco)  Amlodipine  Benazepril  Doxycycline  Estradiol  Hydrochlorothiazide  Levothyroxine (Synthroid)  Magnesium Oxide  Meloxicam (Mobic)  Norethindrone  Potassium (Klor-Con)  Prednisolone  Supplement  Vitamin D3 ==================================================================== For clinical consultation, please call [phone] ====================================================================    UDS interpretation: Compliant          Medication Assessment Form: Reviewed. Patient indicates being compliant with therapy Treatment compliance: Compliant Risk Assessment Profile: Aberrant behavior: See prior evaluations. None observed or detected today Comorbid factors increasing risk of overdose: See prior notes. No additional risks detected today Risk of substance use disorder (SUD): Low Opioid Risk Tool (ORT) Total Score: 0  Interpretation Table:  Score <3 = Low Risk for SUD  Score  between 4-7 = Moderate Risk for SUD  Score >8 = High Risk for Opioid Abuse   Risk Mitigation Strategies:  Patient Counseling: Covered Patient-Prescriber Agreement (PPA): Present and active  Notification to other healthcare providers: Done  Pharmacologic Plan: No change in therapy, at this time  Laboratory Chemistry  Inflammation Markers Lab Results  Component Value Date   CRP <0.8 05/01/2016   ESRSEDRATE 8 05/01/2016   (CRP: Acute Phase) (ESR: Chronic Phase) Renal Function Markers Lab Results  Component Value Date   BUN 18 05/01/2016   CREATININE 0.82 05/01/2016   GFRAA >60 05/01/2016   GFRNONAA >60 05/01/2016   Hepatic Function Markers Lab Results   Component Value Date   AST 37 05/01/2016   ALT 47 05/01/2016   ALBUMIN 4.6 05/01/2016   ALKPHOS 93 05/01/2016   Electrolytes Lab Results  Component Value Date   NA 141 05/01/2016   K 4.0 05/01/2016   CL 106 05/01/2016   CALCIUM 10.0 05/01/2016   MG 2.0 05/01/2016   Neuropathy Markers Lab Results  Component Value Date   VITAMINB12 302 05/01/2016   Bone Pathology Markers Lab Results  Component Value Date   ALKPHOS 93 05/01/2016   25OHVITD1 32 05/01/2016   25OHVITD2 2.2 05/01/2016   25OHVITD3 30 05/01/2016   CALCIUM 10.0 05/01/2016   Coagulation Parameters No results found for: INR, LABPROT, APTT, PLT Cardiovascular Markers Lab Results  Component Value Date   HGB 15.6 (H) 10/11/2011   HCT 46.0 10/11/2011   Note: Lab results reviewed.  Recent Diagnostic Imaging Review  No results found. Note: Imaging results reviewed.          Meds  The patient has a current medication list which includes the following prescription(s): amlodipine, vitamin d3, doxycycline, hydrochlorothiazide, hydrocodone-acetaminophen, hydrocodone-acetaminophen, hydrocodone-acetaminophen, levothyroxine, magnesium oxide, meloxicam, potassium chloride, prednisolone acetate, and benefiber.  Current Outpatient Prescriptions on File Prior to Visit  Medication Sig  . Cholecalciferol (VITAMIN D3) 2000 UNITS capsule Take 1 capsule (2,000 Units total) by mouth daily.  [name] Kitchen doxycycline (DORYX) 100 MG DR capsule Take 100 mg by mouth 2 (two) times daily.  . hydrochlorothiazide (HYDRODIURIL) 25 MG tablet Take 25 mg by mouth daily.  [name] Kitchen levothyroxine (SYNTHROID, LEVOTHROID) 150 MCG tablet Take 150 mcg by mouth daily before breakfast.  . meloxicam (MOBIC) 15 MG tablet Take 15 mg by mouth daily.  . potassium chloride (KLOR-CON) 8 MEQ tablet Take 8 mEq by mouth daily.  . prednisoLONE acetate (PRED FORTE) 1 % ophthalmic suspension Place 1 drop into the right eye every 2 (two) hours while awake.   No current  facility-administered medications on file prior to visit.    ROS  Constitutional: Denies any fever or chills Gastrointestinal: No reported hemesis, hematochezia, vomiting, or acute GI distress Musculoskeletal: Denies any acute onset joint swelling, redness, loss of ROM, or weakness Neurological: No reported episodes of acute onset apraxia, aphasia, dysarthria, agnosia, amnesia, paralysis, loss of coordination, or loss of consciousness  Allergies  Alison Cox is allergic to penicillins; shellfish allergy; and sulfa antibiotics.  [place]  Drug: Alison Cox  reports that she does not use drugs. Alcohol:  reports that she does not drink alcohol. Tobacco:  reports that she has never smoked. She has never used smokeless tobacco. Medical:  has a past medical history of History of Bell's palsy (03/06/2015); History of Hashimoto thyroiditis (03/06/2015); History of nephrolithiasis (03/06/2015); Hypertension; Kidney stone; Macular corneal dystrophy (2005); Osteoarthritis; and Thyroid disease. Family: family history includes Hypertension in her father and mother.  Past Surgical History:  Procedure Laterality Date  . CORNEAL TRANSPLANT    . CORNEAL TRANSPLANT    . DILATATION AND CURETTAGE    . ENDOMETRIAL ABLATION     Constitutional Exam  General appearance: Well nourished, well developed, and well hydrated. In no apparent acute distress Vitals:   07/29/16 1027  BP: 134/64  Pulse: 85  Resp: 18  Temp: 98.4 F (36.9 C)  TempSrc: Oral  SpO2: 94%  Weight: 280 lb (127 kg)  Height: '5\' 3"'  (1.6 m)   BMI Assessment: Estimated body mass index is 49.6 kg/m as calculated from the following:   Height as of this encounter: '5\' 3"'  (1.6 m).   Weight as of this encounter: 280 lb (127 kg).  BMI interpretation table: BMI level Category Range association with higher incidence of chronic pain  <18 kg/m2 Underweight   18.5-24.9 kg/m2 Ideal body weight   25-29.9 kg/m2 Overweight Increased incidence by 20%   30-34.9 kg/m2 Obese (Class I) Increased incidence by 68%  35-39.9 kg/m2 Severe obesity (Class II) Increased incidence by 136%  >40 kg/m2 Extreme obesity (Class III) Increased incidence by 254%   BMI Readings from Last 4 Encounters:  07/29/16 49.60 kg/m  05/01/16 49.60 kg/m  01/31/16 49.38 kg/m  10/08/15 49.38 kg/m   Wt Readings from Last 4 Encounters:  07/29/16 280 lb (127 kg)  05/01/16 280 lb (127 kg)  01/31/16 270 lb (122.5 kg)  10/08/15 270 lb (122.5 kg)  Psych/Mental status: Alert, oriented x 3 (person, place, & time)       Eyes: PERLA Respiratory: No evidence of acute respiratory distress  Cervical Spine Exam  Inspection: No masses, redness, or swelling Alignment: Symmetrical Functional ROM: Unrestricted ROM Stability: No instability detected Muscle strength & Tone: Functionally intact Sensory: Unimpaired Palpation: No palpable anomalies  Upper Extremity (UE) Exam    Side: Right upper extremity  Side: Left upper extremity  Inspection: No masses, redness, swelling, or asymmetry. No contractures  Inspection: No masses, redness, swelling, or asymmetry. No contractures  Functional ROM: Unrestricted ROM          Functional ROM: Unrestricted ROM          Muscle strength & Tone: Functionally intact  Muscle strength & Tone: Functionally intact  Sensory: Unimpaired  Sensory: Unimpaired  Palpation: No palpable anomalies  Palpation: No palpable anomalies  Specialized Test(s): Deferred         Specialized Test(s): Deferred          Thoracic Spine Exam  Inspection: No masses, redness, or swelling Alignment: Symmetrical Functional ROM: Unrestricted ROM Stability: No instability detected Sensory: Unimpaired Muscle strength & Tone: No palpable anomalies  Lumbar Spine Exam  Inspection: No masses, redness, or swelling Alignment: Symmetrical Functional ROM: Unrestricted ROM Stability: No instability detected Muscle strength & Tone: Functionally intact Sensory:  Unimpaired Palpation: No palpable anomalies Provocative Tests: Lumbar Hyperextension and rotation test: evaluation deferred today       Patrick's Maneuver: evaluation deferred today              Gait & Posture Assessment  Ambulation: Unassisted Gait: Relatively normal for age and body habitus Posture: WNL   Lower Extremity Exam    Side: Right lower extremity  Side: Left lower extremity  Inspection: No masses, redness, swelling, or asymmetry. No contractures  Inspection: No masses, redness, swelling, or asymmetry. No contractures  Functional ROM: Unrestricted ROM          Functional ROM: Unrestricted ROM  Muscle strength & Tone: Functionally intact  Muscle strength & Tone: Functionally intact  Sensory: Unimpaired  Sensory: Unimpaired  Palpation: No palpable anomalies  Palpation: No palpable anomalies   Assessment  Primary Diagnosis & Pertinent Problem List: The primary encounter diagnosis was Chronic knee pain (Location of Primary Source of Pain) (Bilateral) (R>L). Diagnoses of Chronic ankle pain (Location of Secondary source of pain) (Bilateral) (L>R), Chronic low back pain (Location of Tertiary source of pain) (midline), Chronic pain syndrome, Musculoskeletal pain, Long term prescription opiate use, Opiate use (40 MME/Day), and Therapeutic opioid-induced constipation (OIC) were also pertinent to this visit.  Status Diagnosis  Controlled Controlled Controlled 1. Chronic knee pain (Location of Primary Source of Pain) (Bilateral) (R>L)   2. Chronic ankle pain (Location of Secondary source of pain) (Bilateral) (L>R)   3. Chronic low back pain (Location of Tertiary source of pain) (midline)   4. Chronic pain syndrome   5. Musculoskeletal pain   6. Long term prescription opiate use   7. Opiate use (40 MME/Day)   8. Therapeutic opioid-induced constipation (OIC)      Plan of Care  Pharmacotherapy (Medications Ordered): Meds ordered this encounter  Medications  .  HYDROcodone-acetaminophen (NORCO) 10-325 MG tablet    Sig: Take 1 tablet by mouth every 6 (six) hours as needed for severe pain.    Dispense:  120 tablet    Refill:  0    Do not place this medication, or any other prescription from our practice, on "Automatic Refill". Patient may have prescription filled one day early if pharmacy is closed on scheduled refill date. Do not fill until: 08/29/16 To last until: 09/28/16  . HYDROcodone-acetaminophen (NORCO) 10-325 MG tablet    Sig: Take 1 tablet by mouth every 6 (six) hours as needed for severe pain.    Dispense:  120 tablet    Refill:  0    Do not place this medication, or any other prescription from our practice, on "Automatic Refill". Patient may have prescription filled one day early if pharmacy is closed on scheduled refill date. Do not fill until: 09/28/16 To last until: 10/28/16  . HYDROcodone-acetaminophen (NORCO) 10-325 MG tablet    Sig: Take 1 tablet by mouth every 6 (six) hours as needed for severe pain.    Dispense:  120 tablet    Refill:  0    Do not place this medication, or any other prescription from our practice, on "Automatic Refill". Patient may have prescription filled one day early if pharmacy is closed on scheduled refill date. Do not fill until: 07/30/16 To last until: 08/29/16  . Wheat Dextrin (BENEFIBER) POWD    Sig: Stir 2 teaspoons of Benefiber into 4-8 oz of any non-carbonated beverage or soft food (hot or cold) TID.    Dispense:  500 g    Refill:  PRN    Do not place this medication, or any other prescription from our practice, on "Automatic Refill". Patient may have prescription filled one day early if pharmacy is closed on scheduled refill date.  . Magnesium Oxide 500 MG CAPS    Sig: Take 1 capsule (500 mg total) by mouth daily.    Dispense:  90 capsule    Refill:  0    Do not place this medication, or any other prescription from our practice, on "Automatic Refill". Patient may have prescription filled one day  early if pharmacy is closed on scheduled refill date.   New Prescriptions   No medications on file  Medications administered today: Alison Cox had no medications administered during this visit. Lab-work, procedure(s), and/or referral(s): No orders of the defined types were placed in this encounter.  Imaging and/or referral(s): None  Interventional therapies: Planned, scheduled, and/or pending:   None at this time. The patient's body habitus makes any interventional therapy technically challenging and decreases the chances of success. The patient has been informed that once her BMI goes down low 35, we will revisit and offer her other alternatives.   Considering:   None at this time. The patient's body habitus makes any interventional therapy technically challenging and decreases the chances of success. The patient has been informed that once her BMI goes down low 35, we will revisit and offer her other alternatives.   Palliative PRN treatment(s):   None at this time. The patient's body habitus makes any interventional therapy technically challenging and decreases the chances of success. The patient has been informed that once her BMI goes down low 35, we will revisit and offer her other alternatives.   Provider-requested follow-up: Return in 3 months (on 10/14/2016) for (Nurse Practitioner) Med-Mgmt.  Future Appointments Date Time Provider [place]  10/14/2016 10:00 AM [place], NP [facility] None   Primary Care Physician: Lajean Manes, MD Location: [facility] Outpatient Pain Management Facility Note by: Kathlen Brunswick. Dossie Arbour, M.D, DABA, DABAPM, DABPM, DABIPP, FIPP Date: 07/29/2016; Time: 8:11 PM  Pain Score Disclaimer: We use the NRS-11 scale. This is a self-reported, subjective measurement of pain severity with only modest accuracy. It is used primarily to identify changes within a particular patient. It must be understood that outpatient pain scales are significantly less  accurate that those used for research, where they can be applied under ideal controlled circumstances with minimal exposure to variables. In reality, the score is likely to be a combination of pain intensity and pain affect, where pain affect describes the degree of emotional arousal or changes in action readiness caused by the sensory experience of pain. Factors such as social and work situation, setting, emotional state, anxiety levels, expectation, and prior pain experience may influence pain perception and show large inter-individual differences that may also be affected by time variables.  Patient instructions provided during this appointment: There are no Patient Instructions on file for this visit.

## 2016-07-29 NOTE — Progress Notes (Signed)
Nursing Pain Medication Assessment:  Safety precautions to be maintained throughout the outpatient stay will include: orient to surroundings, keep bed in low position, maintain call bell within reach at all times, provide assistance with transfer out of bed and ambulation.  Medication Inspection Compliance: Pill count conducted under aseptic conditions, in front of the patient. Neither the pills nor the bottle was removed from the patient's sight at any time. Once count was completed pills were immediately returned to the patient in their original bottle.  Medication: Hydrocodone/APAP Pill/Patch Count: 9 of 120 pills remain Pill/Patch Appearance: Markings consistent with prescribed medication Bottle Appearance: Standard pharmacy container. Clearly labeled. Filled Date: 03/12 / 2018 Last Medication intake:  Today

## 2016-07-30 DIAGNOSIS — Z947 Corneal transplant status: Secondary | ICD-10-CM | POA: Diagnosis not present

## 2016-09-17 DIAGNOSIS — E039 Hypothyroidism, unspecified: Secondary | ICD-10-CM | POA: Diagnosis not present

## 2016-09-17 DIAGNOSIS — E041 Nontoxic single thyroid nodule: Secondary | ICD-10-CM | POA: Diagnosis not present

## 2016-09-23 DIAGNOSIS — E038 Other specified hypothyroidism: Secondary | ICD-10-CM | POA: Diagnosis not present

## 2016-09-23 DIAGNOSIS — E041 Nontoxic single thyroid nodule: Secondary | ICD-10-CM | POA: Diagnosis not present

## 2016-09-23 DIAGNOSIS — E063 Autoimmune thyroiditis: Secondary | ICD-10-CM | POA: Insufficient documentation

## 2016-10-01 ENCOUNTER — Other Ambulatory Visit: Payer: Self-pay | Admitting: Pain Medicine

## 2016-10-01 DIAGNOSIS — E559 Vitamin D deficiency, unspecified: Secondary | ICD-10-CM

## 2016-10-10 ENCOUNTER — Other Ambulatory Visit: Payer: Self-pay | Admitting: Pain Medicine

## 2016-10-14 ENCOUNTER — Encounter: Payer: Self-pay | Admitting: Nurse Practitioner

## 2016-10-14 ENCOUNTER — Ambulatory Visit: Payer: 59 | Attending: Nurse Practitioner | Admitting: Nurse Practitioner

## 2016-10-14 VITALS — BP 133/71 | HR 103 | Temp 98.2°F | Resp 18 | Ht 63.0 in | Wt 295.0 lb

## 2016-10-14 DIAGNOSIS — G510 Bell's palsy: Secondary | ICD-10-CM | POA: Insufficient documentation

## 2016-10-14 DIAGNOSIS — I10 Essential (primary) hypertension: Secondary | ICD-10-CM | POA: Insufficient documentation

## 2016-10-14 DIAGNOSIS — M19071 Primary osteoarthritis, right ankle and foot: Secondary | ICD-10-CM

## 2016-10-14 DIAGNOSIS — Z79891 Long term (current) use of opiate analgesic: Secondary | ICD-10-CM | POA: Diagnosis not present

## 2016-10-14 DIAGNOSIS — G894 Chronic pain syndrome: Secondary | ICD-10-CM | POA: Insufficient documentation

## 2016-10-14 DIAGNOSIS — M47816 Spondylosis without myelopathy or radiculopathy, lumbar region: Secondary | ICD-10-CM | POA: Insufficient documentation

## 2016-10-14 DIAGNOSIS — F1120 Opioid dependence, uncomplicated: Secondary | ICD-10-CM | POA: Insufficient documentation

## 2016-10-14 DIAGNOSIS — Z87442 Personal history of urinary calculi: Secondary | ICD-10-CM | POA: Insufficient documentation

## 2016-10-14 DIAGNOSIS — M4316 Spondylolisthesis, lumbar region: Secondary | ICD-10-CM | POA: Insufficient documentation

## 2016-10-14 DIAGNOSIS — M545 Low back pain: Secondary | ICD-10-CM | POA: Diagnosis not present

## 2016-10-14 DIAGNOSIS — G8929 Other chronic pain: Secondary | ICD-10-CM

## 2016-10-14 DIAGNOSIS — M25571 Pain in right ankle and joints of right foot: Secondary | ICD-10-CM | POA: Diagnosis not present

## 2016-10-14 DIAGNOSIS — M170 Bilateral primary osteoarthritis of knee: Secondary | ICD-10-CM

## 2016-10-14 DIAGNOSIS — E063 Autoimmune thyroiditis: Secondary | ICD-10-CM | POA: Diagnosis not present

## 2016-10-14 DIAGNOSIS — M25572 Pain in left ankle and joints of left foot: Secondary | ICD-10-CM | POA: Insufficient documentation

## 2016-10-14 DIAGNOSIS — M791 Myalgia: Secondary | ICD-10-CM | POA: Diagnosis not present

## 2016-10-14 DIAGNOSIS — M25562 Pain in left knee: Secondary | ICD-10-CM | POA: Insufficient documentation

## 2016-10-14 DIAGNOSIS — E559 Vitamin D deficiency, unspecified: Secondary | ICD-10-CM | POA: Insufficient documentation

## 2016-10-14 DIAGNOSIS — E669 Obesity, unspecified: Secondary | ICD-10-CM | POA: Insufficient documentation

## 2016-10-14 DIAGNOSIS — M5137 Other intervertebral disc degeneration, lumbosacral region: Secondary | ICD-10-CM | POA: Diagnosis not present

## 2016-10-14 DIAGNOSIS — M25561 Pain in right knee: Secondary | ICD-10-CM | POA: Diagnosis not present

## 2016-10-14 DIAGNOSIS — Z6841 Body Mass Index (BMI) 40.0 and over, adult: Secondary | ICD-10-CM | POA: Insufficient documentation

## 2016-10-14 DIAGNOSIS — M25552 Pain in left hip: Secondary | ICD-10-CM | POA: Insufficient documentation

## 2016-10-14 DIAGNOSIS — M19072 Primary osteoarthritis, left ankle and foot: Secondary | ICD-10-CM | POA: Insufficient documentation

## 2016-10-14 DIAGNOSIS — T402X5A Adverse effect of other opioids, initial encounter: Secondary | ICD-10-CM | POA: Insufficient documentation

## 2016-10-14 DIAGNOSIS — M47817 Spondylosis without myelopathy or radiculopathy, lumbosacral region: Secondary | ICD-10-CM | POA: Insufficient documentation

## 2016-10-14 DIAGNOSIS — E042 Nontoxic multinodular goiter: Secondary | ICD-10-CM | POA: Insufficient documentation

## 2016-10-14 DIAGNOSIS — K5900 Constipation, unspecified: Secondary | ICD-10-CM | POA: Diagnosis not present

## 2016-10-14 MED ORDER — HYDROCODONE-ACETAMINOPHEN 10-325 MG PO TABS: 1.0000 | ORAL_TABLET | Freq: Four times a day (QID) | ORAL | 0 refills | Status: DC | PRN

## 2016-10-14 NOTE — Progress Notes (Signed)
Patient's Name: Alison Cox  MRN: [id number]  Referring Provider: Lajean Manes, MD  DOB: 04-Apr-1956  PCP: Alison Manes, MD  DOS: 10/14/2016  Note by: Alison Cox  Service setting: Ambulatory outpatient  Specialty: Interventional Pain Management  Location: [facility] (AMB) Pain Management Facility    Patient type: Established    Primary Reason(s) for Visit: Encounter for prescription drug management (Level of risk: moderate) CC: Knee Pain (bilateral)  HPI  Alison Cox is a 61 y.o. year old, female patient, who comes today for a medication management evaluation. 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; Long term current use of opiate analgesic; Long term prescription opiate use; Opiate use (40 MME/Day); Encounter for therapeutic drug level monitoring; Opioid dependence ([place]); Lumbar spondylosis; Grade 1 Anterolisthesis of L4 over L5; Therapeutic opioid-induced constipation (OIC); Chronic knee pain (Location of Primary Source of Pain) (Bilateral) (R>L); Osteoarthritis of knees (Location of Primary Source of Pain) (Bilateral) (R>L); Vitamin D insufficiency; History of Bell's palsy (left-sided); History of nephrolithiasis; History of Hashimoto thyroiditis; Osteoarthrosis; Obesity, Class III, BMI 40-49.9 (morbid obesity) (HCC) (254% higher incidence of chronic low back pain); Spondylolysis of lumbosacral region (Advanced disc degeneration and spondylosis L5-S1); Osteoarthritis of ankles (Location of Secondary source of pain) (Bilateral) (L>R); Chronic low back pain (Location of Tertiary source of pain) (midline); Chronic hip pain (Left); Chronic pain syndrome; Musculoskeletal pain; and Chronic ankle pain (Location of Secondary source of pain) (Bilateral) (L>R) on her problem list. Her primarily concern today is the Knee Pain (bilateral)  Pain Assessment: Location: Right, Left Knee Radiating: both ankles Onset: More than a  month ago Duration: Chronic pain Quality: Aching Severity: 2 /10 (self-reported pain score)  Note: Reported level is compatible with observation.                   Effect on ADL:   Timing: Intermittent Modifying factors: elevation of feet  Alison Cox was last scheduled for an appointment on 07/29/16 medication management. During today's appointment we reviewed Alison Cox's chronic pain status, as well as her outpatient medication regimen. She has chronic bilatareral knee pain. She has left greater than right. She radicular symptoms that do down into her ankles. She states that she knows that she must work on her weight.  The patient  reports that she does not use drugs. Her body mass index is 52.26 kg/m.  Further details on both, my assessment(s), as well as the proposed treatment plan, please see below.  Controlled Substance Pharmacotherapy Assessment REMS (Risk Evaluation and Mitigation Strategy)  Analgesic:Hydrocodone/APAP 10/325 one tablet by mouth every 6 hours (total: 40 mg/day) MME/day:40 mg/day Alison Martins, RN  10/14/2016 10:10 AM  Sign at close encounter Nursing Pain Medication Assessment:  Safety precautions to be maintained throughout the outpatient stay will include: orient to surroundings, keep bed in low position, maintain call bell within reach at all times, provide assistance with transfer out of bed and ambulation.  Medication Inspection Compliance: Pill count conducted under aseptic conditions, in front of the patient. Neither the pills nor the bottle was removed from the patient's sight at any time. Once count was completed pills were immediately returned to the patient in their original bottle.  Medication: Hydrocodone/APAP Pill/Patch Count: 80 of 120 pills remain Pill/Patch Appearance: Markings consistent with prescribed medication Bottle Appearance: Standard pharmacy container. Clearly labeled. Filled Date: 06/13 / 2018 Last Medication intake:  Today    Pharmacokinetics: Liberation and absorption (onset  of action): WNL Distribution (time to peak effect): WNL Metabolism and excretion (duration of action): WNL         Pharmacodynamics: Desired effects: Analgesia: Ms. Trias reports >50% benefit. Functional ability: Patient reports that medication allows her to accomplish basic ADLs Clinically meaningful improvement in function (CMIF): Sustained CMIF goals met Perceived effectiveness: Described as relatively effective, allowing for increase in activities of daily living (ADL) Undesirable effects: Side-effects or Adverse reactions: None reported Monitoring: [place] PMP: Online review of the past 32-monthperiod conducted. Compliant with practice rules and regulations List of all UDS test(s) done:  Lab Results  Component Value Date   TOXASSSELUR FINAL 01/31/2016   TOXASSSELUR FINAL 06/06/2015   TOXASSSELUR FINAL 03/06/2015   Last UDS on record: ToxAssure Select 13  Date Value Ref Range Status  01/31/2016 FINAL  Final    Comment:    ==================================================================== TOXASSURE SELECT 13 (MW) ==================================================================== Test                             Result       Flag       Units Drug Present and Declared for Prescription Verification   Hydrocodone                    2277         EXPECTED   ng/mg creat   Hydromorphone                  465          EXPECTED   ng/mg creat   Dihydrocodeine                 546          EXPECTED   ng/mg creat   Norhydrocodone                 1474         EXPECTED   ng/mg creat    Sources of hydrocodone include scheduled prescription    medications. Hydromorphone, dihydrocodeine and norhydrocodone are    expected metabolites of hydrocodone. Hydromorphone and    dihydrocodeine are also available as scheduled prescription    medications. ==================================================================== Test                       Result    Flag   Units      Ref Range   Creatinine              246              mg/dL      >=20 ==================================================================== Declared Medications:  The flagging and interpretation on this report are based on the  following declared medications.  Unexpected results may arise from  inaccuracies in the declared medications.  **Note: The testing scope of this panel includes these medications:  Hydrocodone (Norco)  **Note: The testing scope of this panel does not include following  reported medications:  Acetaminophen (Norco)  Amlodipine  Benazepril  Doxycycline  Estradiol  Hydrochlorothiazide  Levothyroxine (Synthroid)  Magnesium Oxide  Meloxicam (Mobic)  Norethindrone  Potassium (Klor-Con)  Prednisolone  Supplement  Vitamin D3 ==================================================================== For clinical consultation, please call ([phone] ====================================================================    UDS interpretation: Compliant          Medication Assessment Form: Reviewed. Patient indicates being compliant with therapy Treatment compliance: Compliant Risk Assessment Profile: Aberrant behavior: See prior  evaluations. None observed or detected today Comorbid factors increasing risk of overdose: See prior notes. No additional risks detected today Risk of substance use disorder (SUD): Low Opioid Risk Tool (ORT) Total Score: 0  Interpretation Table:  Score <3 = Low Risk for SUD  Score between 4-7 = Moderate Risk for SUD  Score >8 = High Risk for Opioid Abuse   Risk Mitigation Strategies:  Patient Counseling: Covered Patient-Prescriber Agreement (PPA): Present and active  Notification to other healthcare providers: Done  Pharmacologic Plan: No change in therapy, at this time  Laboratory Chemistry  Inflammation Markers Lab Results  Component Value Date   CRP <0.8 05/01/2016   ESRSEDRATE 8 05/01/2016   (CRP:  Acute Phase) (ESR: Chronic Phase) Renal Function Markers Lab Results  Component Value Date   BUN 18 05/01/2016   CREATININE 0.82 05/01/2016   GFRAA >60 05/01/2016   GFRNONAA >60 05/01/2016   Hepatic Function Markers Lab Results  Component Value Date   AST 37 05/01/2016   ALT 47 05/01/2016   ALBUMIN 4.6 05/01/2016   ALKPHOS 93 05/01/2016   Electrolytes Lab Results  Component Value Date   NA 141 05/01/2016   K 4.0 05/01/2016   CL 106 05/01/2016   CALCIUM 10.0 05/01/2016   MG 2.0 05/01/2016   Neuropathy Markers Lab Results  Component Value Date   VITAMINB12 302 05/01/2016   Bone Pathology Markers Lab Results  Component Value Date   ALKPHOS 93 05/01/2016   25OHVITD1 32 05/01/2016   25OHVITD2 2.2 05/01/2016   25OHVITD3 30 05/01/2016   CALCIUM 10.0 05/01/2016   Coagulation Parameters No results found for: INR, LABPROT, APTT, PLT Cardiovascular Markers Lab Results  Component Value Date   HGB 15.6 (H) 10/11/2011   HCT 46.0 10/11/2011   Note: Lab results reviewed.  Recent Diagnostic Imaging Review  No results found. Note: Imaging results reviewed.          Meds   Current Outpatient Prescriptions (Endocrine & Metabolic):  .  levothyroxine (SYNTHROID, LEVOTHROID) 150 MCG tablet, Take 150 mcg by mouth daily before breakfast.  Current Outpatient Prescriptions (Cardiovascular):  .  amLODipine (NORVASC) 5 MG tablet, Take 5 mg by mouth daily. .  hydrochlorothiazide (HYDRODIURIL) 25 MG tablet, Take 25 mg by mouth daily.   Current Outpatient Prescriptions (Analgesics):  .  [START ON 10/31/2016] HYDROcodone-acetaminophen (NORCO) 10-325 MG tablet, Take 1 tablet by mouth every 6 (six) hours as needed for severe pain. .  meloxicam (MOBIC) 15 MG tablet, Take 15 mg by mouth daily. Derrill Memo ON 11/30/2016] HYDROcodone-acetaminophen (NORCO) 10-325 MG tablet, Take 1 tablet by mouth every 6 (six) hours as needed for severe pain. Derrill Memo ON 12/30/2016] HYDROcodone-acetaminophen  (NORCO) 10-325 MG tablet, Take 1 tablet by mouth every 6 (six) hours as needed for severe pain.   Current Outpatient Prescriptions (Other):  .  calcium carbonate (CALCIUM 600) 1500 (600 Ca) MG TABS tablet, Take by mouth 2 (two) times daily with a meal. .  Cholecalciferol (VITAMIN D3) 2000 UNITS capsule, Take 1 capsule (2,000 Units total) by mouth daily. .  Magnesium Oxide 500 MG CAPS, Take 1 capsule (500 mg total) by mouth daily. .  potassium chloride (KLOR-CON) 8 MEQ tablet, Take 8 mEq by mouth daily. .  prednisoLONE acetate (PRED FORTE) 1 % ophthalmic suspension, Place 1 drop into the right eye every 2 (two) hours while awake. .  Wheat Dextrin (BENEFIBER) POWD, Stir 2 teaspoons of Benefiber into 4-8 oz of any non-carbonated beverage or soft  food (hot or cold) TID. [name] Kitchen  doxycycline (DORYX) 100 MG DR capsule, Take 100 mg by mouth 2 (two) times daily.  ROS  Constitutional: Denies any fever or chills Gastrointestinal: No reported hemesis, hematochezia, vomiting, or acute GI distress Musculoskeletal: Denies any acute onset joint swelling, redness, loss of ROM, or weakness Neurological: No reported episodes of acute onset apraxia, aphasia, dysarthria, agnosia, amnesia, paralysis, loss of coordination, or loss of consciousness  Allergies  Ms. Mctague is allergic to penicillins; shellfish allergy; and sulfa antibiotics.  [place]  Drug: Ms. Tenorio  reports that she does not use drugs. Alcohol:  reports that she does not drink alcohol. Tobacco:  reports that she has never smoked. She has never used smokeless tobacco. Medical:  has a past medical history of History of Bell's palsy (03/06/2015); History of Hashimoto thyroiditis (03/06/2015); History of nephrolithiasis (03/06/2015); Hypertension; Kidney stone; Macular corneal dystrophy (2005); Osteoarthritis; and Thyroid disease. Surgical: Ms. Norgard  has a past surgical history that includes Endometrial ablation; DILATATION AND CURETTAGE; Corneal transplant;  and Corneal transplant. Family: family history includes Hypertension in her father and mother.  Constitutional Exam  General appearance: Well nourished, well developed, and well hydrated. In no apparent acute distress Vitals:   10/14/16 1005  BP: 133/71  Pulse: (!) 103  Resp: 18  Temp: 98.2 F (36.8 C)  TempSrc: Oral  SpO2: 97%  Weight: 295 lb (133.8 kg)  Height: '5\' 3"'  (1.6 m)   BMI Assessment: Estimated body mass index is 52.26 kg/m as calculated from the following:   Height as of this encounter: '5\' 3"'  (1.6 m).   Weight as of this encounter: 295 lb (133.8 kg).  BMI interpretation table: BMI level Category Range association with higher incidence of chronic pain  <18 kg/m2 Underweight   18.5-24.9 kg/m2 Ideal body weight   25-29.9 kg/m2 Overweight Increased incidence by 20%  30-34.9 kg/m2 Obese (Class I) Increased incidence by 68%  35-39.9 kg/m2 Severe obesity (Class II) Increased incidence by 136%  >40 kg/m2 Extreme obesity (Class III) Increased incidence by 254%   BMI Readings from Last 4 Encounters:  10/14/16 52.26 kg/m  07/29/16 49.60 kg/m  05/01/16 49.60 kg/m  01/31/16 49.38 kg/m   Wt Readings from Last 4 Encounters:  10/14/16 295 lb (133.8 kg)  07/29/16 280 lb (127 kg)  05/01/16 280 lb (127 kg)  01/31/16 270 lb (122.5 kg)  Psych/Mental status: Alert, oriented x 3 (person, place, & time)       Eyes: PERLA Respiratory: No evidence of acute respiratory distress  Cervical Spine Exam  Inspection: No masses, redness, or swelling Alignment: Symmetrical Functional ROM: Unrestricted ROM      Stability: No instability detected Muscle strength & Tone: Functionally intact Sensory: Unimpaired Palpation: No palpable anomalies              Upper Extremity (UE) Exam    Side: Right upper extremity  Side: Left upper extremity  Inspection: No masses, redness, swelling, or asymmetry. No contractures  Inspection: No masses, redness, swelling, or asymmetry. No contractures   Functional ROM: Unrestricted ROM          Functional ROM: Unrestricted ROM          Muscle strength & Tone: Functionally intact  Muscle strength & Tone: Functionally intact  Sensory: Unimpaired  Sensory: Unimpaired  Palpation: No palpable anomalies              Palpation: No palpable anomalies  Specialized Test(s): Deferred         Specialized Test(s): Deferred          Thoracic Spine Exam  Inspection: No masses, redness, or swelling Alignment: Symmetrical Functional ROM: Unrestricted ROM Stability: No instability detected Sensory: Unimpaired Muscle strength & Tone: No palpable anomalies  Lumbar Spine Exam  Inspection: No masses, redness, or swelling Alignment: Symmetrical Functional ROM: Unrestricted ROM      Stability: No instability detected Muscle strength & Tone: Functionally intact Sensory: Unimpaired Palpation: No palpable anomalies       Provocative Tests: Lumbar Hyperextension and rotation test: evaluation deferred today       Patrick's Maneuver: evaluation deferred today                    Gait & Posture Assessment  Ambulation: Unassisted Gait: Relatively normal for age and body habitus Posture: WNL   Lower Extremity Exam    Side: Right lower extremity  Side: Left lower extremity  Inspection: creptius  Inspection: crepitus  Functional ROM: Painful ROM          Functional ROM: Painful ROM          Muscle strength & Tone: Functionally intact  Muscle strength & Tone: Functionally intact  Sensory: Unimpaired  Sensory: Unimpaired  Palpation: No palpable anomalies  Palpation: No palpable anomalies   Assessment  Primary Diagnosis & Pertinent Problem List: The primary encounter diagnosis was Chronic knee pain (Location of Primary Source of Pain) (Bilateral) (R>L). Diagnoses of Primary osteoarthritis of both knees, Osteoarthritis of both ankles, unspecified osteoarthritis type, Chronic pain syndrome, and Long term current use of opiate analgesic were also  pertinent to this visit.  Status Diagnosis  Controlled Controlled Controlled 1. Chronic knee pain (Location of Primary Source of Pain) (Bilateral) (R>L)   2. Primary osteoarthritis of both knees   3. Osteoarthritis of both ankles, unspecified osteoarthritis type   4. Chronic pain syndrome   5. Long term current use of opiate analgesic     Problems updated and reviewed during this visit: Problem  Musculoskeletal Pain  Chronic ankle pain (Location of Secondary source of pain) (Bilateral) (L>R)  Chronic Pain Syndrome  Osteoarthritis of ankles (Location of Secondary source of pain) (Bilateral) (L>R)  Chronic low back pain (Location of Tertiary source of pain) (midline)  Chronic hip pain (Left)  Lumbar Spondylosis   Minimal anterior slip L4-5. Moderate to advanced disc degeneration and spondylosis L5 S1.   Grade 1 Anterolisthesis of L4 over L5  Chronic knee pain (Location of Primary Source of Pain) (Bilateral) (R>L)  Osteoarthritis of knees (Location of Primary Source of Pain) (Bilateral) (R>L)  Osteoarthrosis  Spondylolysis of lumbosacral region (Advanced disc degeneration and spondylosis L5-S1)  Long Term Current Use of Opiate Analgesic  Long Term Prescription Opiate Use  Opiate use (40 MME/Day)  Opioid Dependence (Hcc)  Encounter for Therapeutic Drug Level Monitoring  Therapeutic Opioid-Induced Constipation ([place])  Vitamin D Insufficiency  History of Bell's palsy (left-sided)  History of Nephrolithiasis  History of Hashimoto Thyroiditis  Obesity, Class III, BMI 40-49.9 (morbid obesity) (HCC) (254% higher incidence of chronic low back pain)  Pseudoaphakia  Cornea Replaced By Transplant  Bp (High Blood Pressure)  Multiple Thyroid Nodules  Hypothyroidism   Plan of Care  Pharmacotherapy (Medications Ordered): Meds ordered this encounter  Medications  . HYDROcodone-acetaminophen (NORCO) 10-325 MG tablet    Sig: Take 1 tablet by mouth every 6 (six) hours as needed for severe  pain.  Dispense:  120 tablet    Refill:  0    Do not place this medication, or any other prescription from our practice, on "Automatic Refill". Patient may have prescription filled one day early if pharmacy is closed on scheduled refill date. Do not fill until: 10/31/16 To last until: 11/30/16    Order Specific Question:   Supervising Provider    Answer:   Milinda Pointer [phone]  . HYDROcodone-acetaminophen (NORCO) 10-325 MG tablet    Sig: Take 1 tablet by mouth every 6 (six) hours as needed for severe pain.    Dispense:  120 tablet    Refill:  0    Do not place this medication, or any other prescription from our practice, on "Automatic Refill". Patient may have prescription filled one day early if pharmacy is closed on scheduled refill date. Do not fill until: 11/30/16 To last until: 12/30/16    Order Specific Question:   Supervising Provider    Answer:   Milinda Pointer [phone]  . HYDROcodone-acetaminophen (NORCO) 10-325 MG tablet    Sig: Take 1 tablet by mouth every 6 (six) hours as needed for severe pain.    Dispense:  120 tablet    Refill:  0    Do not place this medication, or any other prescription from our practice, on "Automatic Refill". Patient may have prescription filled one day early if pharmacy is closed on scheduled refill date. Do not fill until: 12/30/16 To last until: 01/29/17    Order Specific Question:   Supervising Provider    Answer:   Milinda Pointer [phone]   New Prescriptions   No medications on file   Medications administered today: Ms. Kaman had no medications administered during this visit. Lab-work, procedure(s), and/or referral(s): Orders Placed This Encounter  Procedures  . ToxASSURE Select 13 (MW), Urine   Imaging and/or referral(s): None  Interventional therapies: Planned, scheduled, and/or pending:   Not at this time.   Considering:   None at this time. The patient's body habitus makes any interventional therapy technically  challenging and decreases the chances of success. The patient has been informed that once her BMI goes down low 35, we will revisit and offer her other alternatives.   Palliative PRN treatment(s):   None at this time. The patient's body habitus makes any interventional therapy technically challenging and decreases the chances of success. The patient has been informed that once her BMI goes down low 35, we will revisit and offer her other alternatives.   Provider-requested follow-up: Return in about 3 months (around 01/14/2017) for MedMgmt.  No future appointments. Primary Care Physician: Alison Manes, MD Location: [facility] Note by: Alison Cox Date: 10/14/2016; Time: 10:54 AM  Pain Score Disclaimer: We use the NRS-11 scale. This is a self-reported, subjective measurement of pain severity with only modest accuracy. It is used primarily to identify changes within a particular patient. It must be understood that outpatient pain scales are significantly less accurate that those used for research, where they can be applied under ideal controlled circumstances with minimal exposure to variables. In reality, the score is likely to be a combination of pain intensity and pain affect, where pain affect describes the degree of emotional arousal or changes in action readiness caused by the sensory experience of pain. Factors such as social and work situation, setting, emotional state, anxiety levels, expectation, and prior pain experience may influence pain perception and show large inter-individual differences that may also be affected by  time variables.  Patient instructions provided during this appointment: Patient Instructions   ____________________________________________________________________________________________  Medication Rules  Applies to: All patients receiving prescriptions (written or electronic).  Pharmacy of record: Pharmacy where electronic  prescriptions will be sent. If written prescriptions are taken to a different pharmacy, please inform the nursing staff. The pharmacy listed in the electronic medical record should be the one where you would like electronic prescriptions to be sent.  Prescription refills: Only during scheduled appointments. Applies to both, written and electronic prescriptions.  NOTE: The following applies primarily to controlled substances (Opioid Pain Medications)  Patient's responsibilities: 1. Pain Pills: Bring all pain pills to every appointment (except for procedure appointments). 2. Pill Bottles: Bring pills in original pharmacy bottle. Always bring newest bottle. Bring bottle, even if empty. 3. Medication refills: You are responsible for knowing and keeping track of what medications you need refilled. The day before your appointment, write a list of all prescriptions that need to be refilled. Bring that list to your appointment and give it to the admitting nurse. Prescriptions will be written only during appointments. If you forget a medication, it will not be "Called in", "Faxed", or "electronically sent". You will need to get another appointment to get these prescribed. 4. Prescription Accuracy: You are responsible for carefully inspecting your prescriptions before leaving our office. Have the discharge nurse carefully go over each prescription with you, before taking them home. Make sure that your name is accurately spelled, that your address is correct. Check the name and dose of your medication to make sure it is accurate. Check the number of pills, and the written instructions to make sure they are clear and accurate. Make sure that you are given enough medication to last until your next medication refill appointment. 5. Taking Medication: Take medication as prescribed. Never take more pills than instructed. Never take medication more frequently than prescribed. Taking less pills or less frequently is  permitted and encouraged, when it comes to controlled substances (written prescriptions).  6. Inform other Doctors: Always inform, all of your healthcare providers, of all the medications you take. 7. Pain Medication from other Providers: You are not allowed to accept any additional pain medication from any other Doctor or Healthcare provider. There are two exceptions to this rule. (see below) In the event that you require additional pain medication, you are responsible for notifying us, as stated below. 8. Medication Agreement: You are responsible for carefully reading and following our Medication Agreement. This must be signed before receiving any prescriptions from our practice. Safely store a copy of your signed Agreement. Violations to the Agreement will result in no further prescriptions. (Additional copies of our Medication Agreement are available upon request.) 9. Laws, Rules, & Regulations: All patients are expected to follow all Federal and [employer], Rules, [facility]. Ignorance of the Laws does not constitute a valid excuse.  Exceptions: There are only two exceptions to the rule of not receiving pain medications from other Healthcare Providers. 1. Exception #1 (Emergencies): In the event of an emergency (i.e.: accident requiring emergency care), you are allowed to receive additional pain medication. However, you are responsible for: As soon as you are able, call our office [phone], at any time of the day or night, and leave a message stating your name, the date and nature of the emergency, and the name and dose of the medication prescribed. In the event that your call is answered by a member of our staff, make sure to  document and save the date, time, and the name of the person that took your information.  2. Exception #2 (Planned Surgery): In the event that you are scheduled by another doctor or dentist to have any type of surgery or procedure, you are allowed (for a period  no longer than 30 days), to receive additional pain medication, for the acute post-op pain. However, in this case, you are responsible for picking up a copy of our "Post-op Pain Management for Surgeons" handout, and giving it to your surgeon or dentist. This document is available at our office, and does not require an appointment to obtain it. Simply go to our office during business hours (Monday-Thursday from 8:00 AM to 4:00 PM) (Friday 8:00 AM to 12:00 Noon) or if you have a scheduled appointment with [place], prior to your surgery, and ask for it by name. In addition, you will need to provide [place] with your name, name of your surgeon, type of surgery, and date of procedure or surgery.  You were given 3 prescriptions for Hydrocodone today.  ____________________________________________________________________________________________

## 2016-10-14 NOTE — Progress Notes (Signed)
Nursing Pain Medication Assessment:  Safety precautions to be maintained throughout the outpatient stay will include: orient to surroundings, keep bed in low position, maintain call bell within reach at all times, provide assistance with transfer out of bed and ambulation.  Medication Inspection Compliance: Pill count conducted under aseptic conditions, in front of the patient. Neither the pills nor the bottle was removed from the patient's sight at any time. Once count was completed pills were immediately returned to the patient in their original bottle.  Medication: Hydrocodone/APAP Pill/Patch Count: 80 of 120 pills remain Pill/Patch Appearance: Markings consistent with prescribed medication Bottle Appearance: Standard pharmacy container. Clearly labeled. Filled Date: 06/13 / 2018 Last Medication intake:  Today

## 2016-10-14 NOTE — Patient Instructions (Addendum)
____________________________________________________________________________________________  Medication Rules  Applies to: All patients receiving prescriptions (written or electronic).  Pharmacy of record: Pharmacy where electronic prescriptions will be sent. If written prescriptions are taken to a different pharmacy, please inform the nursing staff. The pharmacy listed in the electronic medical record should be the one where you would like electronic prescriptions to be sent.  Prescription refills: Only during scheduled appointments. Applies to both, written and electronic prescriptions.  NOTE: The following applies primarily to controlled substances (Opioid Pain Medications)  Patient's responsibilities: 1. Pain Pills: Bring all pain pills to every appointment (except for procedure appointments). 2. Pill Bottles: Bring pills in original pharmacy bottle. Always bring newest bottle. Bring bottle, even if empty. 3. Medication refills: You are responsible for knowing and keeping track of what medications you need refilled. The day before your appointment, write a list of all prescriptions that need to be refilled. Bring that list to your appointment and give it to the admitting nurse. Prescriptions will be written only during appointments. If you forget a medication, it will not be "Called in", "Faxed", or "electronically sent". You will need to get another appointment to get these prescribed. 4. Prescription Accuracy: You are responsible for carefully inspecting your prescriptions before leaving our office. Have the discharge nurse carefully go over each prescription with you, before taking them home. Make sure that your name is accurately spelled, that your address is correct. Check the name and dose of your medication to make sure it is accurate. Check the number of pills, and the written instructions to make sure they are clear and accurate. Make sure that you are given enough medication to last  until your next medication refill appointment. 5. Taking Medication: Take medication as prescribed. Never take more pills than instructed. Never take medication more frequently than prescribed. Taking less pills or less frequently is permitted and encouraged, when it comes to controlled substances (written prescriptions).  6. Inform other Doctors: Always inform, all of your healthcare providers, of all the medications you take. 7. Pain Medication from other Providers: You are not allowed to accept any additional pain medication from any other Doctor or Healthcare provider. There are two exceptions to this rule. (see below) In the event that you require additional pain medication, you are responsible for notifying us, as stated below. 8. Medication Agreement: You are responsible for carefully reading and following our Medication Agreement. This must be signed before receiving any prescriptions from our practice. Safely store a copy of your signed Agreement. Violations to the Agreement will result in no further prescriptions. (Additional copies of our Medication Agreement are available upon request.) 9. Laws, Rules, & Regulations: All patients are expected to follow all Federal and [employer], Rules, [facility]. Ignorance of the Laws does not constitute a valid excuse.  Exceptions: There are only two exceptions to the rule of not receiving pain medications from other Healthcare Providers. 1. Exception #1 (Emergencies): In the event of an emergency (i.e.: accident requiring emergency care), you are allowed to receive additional pain medication. However, you are responsible for: As soon as you are able, call our office [phone], at any time of the day or night, and leave a message stating your name, the date and nature of the emergency, and the name and dose of the medication prescribed. In the event that your call is answered by a member of our staff, make sure to document and save the date,  time, and the name of the person that  took your information.  2. Exception #2 (Planned Surgery): In the event that you are scheduled by another doctor or dentist to have any type of surgery or procedure, you are allowed (for a period no longer than 30 days), to receive additional pain medication, for the acute post-op pain. However, in this case, you are responsible for picking up a copy of our "Post-op Pain Management for Surgeons" handout, and giving it to your surgeon or dentist. This document is available at our office, and does not require an appointment to obtain it. Simply go to our office during business hours (Monday-Thursday from 8:00 AM to 4:00 PM) (Friday 8:00 AM to 12:00 Noon) or if you have a scheduled appointment with [place], prior to your surgery, and ask for it by name. In addition, you will need to provide [place] with your name, name of your surgeon, type of surgery, and date of procedure or surgery.  You were given 3 prescriptions for Hydrocodone today.  ____________________________________________________________________________________________

## 2016-10-18 LAB — TOXASSURE SELECT 13 (MW), URINE

## 2016-10-20 DIAGNOSIS — Z23 Encounter for immunization: Secondary | ICD-10-CM | POA: Diagnosis not present

## 2016-10-20 DIAGNOSIS — E042 Nontoxic multinodular goiter: Secondary | ICD-10-CM | POA: Diagnosis not present

## 2016-10-20 DIAGNOSIS — Z0000 Encounter for general adult medical examination without abnormal findings: Secondary | ICD-10-CM | POA: Diagnosis not present

## 2016-10-27 DIAGNOSIS — Z79899 Other long term (current) drug therapy: Secondary | ICD-10-CM | POA: Diagnosis not present

## 2016-10-27 DIAGNOSIS — I10 Essential (primary) hypertension: Secondary | ICD-10-CM | POA: Diagnosis not present

## 2016-10-28 NOTE — Telephone Encounter (Signed)
NA

## 2016-10-30 DIAGNOSIS — D485 Neoplasm of uncertain behavior of skin: Secondary | ICD-10-CM | POA: Diagnosis not present

## 2016-10-30 DIAGNOSIS — D487 Neoplasm of uncertain behavior of other specified sites: Secondary | ICD-10-CM | POA: Diagnosis not present

## 2016-10-30 DIAGNOSIS — C4492 Squamous cell carcinoma of skin, unspecified: Secondary | ICD-10-CM

## 2016-10-30 DIAGNOSIS — L280 Lichen simplex chronicus: Secondary | ICD-10-CM | POA: Diagnosis not present

## 2016-10-30 DIAGNOSIS — L718 Other rosacea: Secondary | ICD-10-CM | POA: Diagnosis not present

## 2016-10-30 DIAGNOSIS — L219 Seborrheic dermatitis, unspecified: Secondary | ICD-10-CM | POA: Diagnosis not present

## 2016-10-30 DIAGNOSIS — L739 Follicular disorder, unspecified: Secondary | ICD-10-CM | POA: Diagnosis not present

## 2016-10-30 HISTORY — DX: Squamous cell carcinoma of skin, unspecified: C44.92

## 2016-11-27 DIAGNOSIS — Z1231 Encounter for screening mammogram for malignant neoplasm of breast: Secondary | ICD-10-CM | POA: Diagnosis not present

## 2016-11-27 DIAGNOSIS — Z01419 Encounter for gynecological examination (general) (routine) without abnormal findings: Secondary | ICD-10-CM | POA: Diagnosis not present

## 2016-12-23 DIAGNOSIS — D485 Neoplasm of uncertain behavior of skin: Secondary | ICD-10-CM | POA: Diagnosis not present

## 2016-12-23 DIAGNOSIS — D487 Neoplasm of uncertain behavior of other specified sites: Secondary | ICD-10-CM | POA: Diagnosis not present

## 2016-12-23 DIAGNOSIS — L281 Prurigo nodularis: Secondary | ICD-10-CM | POA: Diagnosis not present

## 2017-01-13 ENCOUNTER — Ambulatory Visit: Payer: 59 | Attending: Nurse Practitioner | Admitting: Nurse Practitioner

## 2017-01-13 ENCOUNTER — Encounter: Payer: Self-pay | Admitting: Nurse Practitioner

## 2017-01-13 VITALS — BP 153/69 | HR 96 | Temp 98.3°F | Resp 16 | Ht 63.0 in | Wt 295.0 lb

## 2017-01-13 DIAGNOSIS — Z87442 Personal history of urinary calculi: Secondary | ICD-10-CM | POA: Diagnosis not present

## 2017-01-13 DIAGNOSIS — M47896 Other spondylosis, lumbar region: Secondary | ICD-10-CM | POA: Diagnosis not present

## 2017-01-13 DIAGNOSIS — M25572 Pain in left ankle and joints of left foot: Secondary | ICD-10-CM | POA: Diagnosis not present

## 2017-01-13 DIAGNOSIS — M5137 Other intervertebral disc degeneration, lumbosacral region: Secondary | ICD-10-CM | POA: Diagnosis not present

## 2017-01-13 DIAGNOSIS — G8929 Other chronic pain: Secondary | ICD-10-CM

## 2017-01-13 DIAGNOSIS — G894 Chronic pain syndrome: Secondary | ICD-10-CM | POA: Diagnosis not present

## 2017-01-13 DIAGNOSIS — G510 Bell's palsy: Secondary | ICD-10-CM | POA: Diagnosis not present

## 2017-01-13 DIAGNOSIS — M25561 Pain in right knee: Secondary | ICD-10-CM | POA: Diagnosis not present

## 2017-01-13 DIAGNOSIS — M25562 Pain in left knee: Secondary | ICD-10-CM

## 2017-01-13 DIAGNOSIS — Z8249 Family history of ischemic heart disease and other diseases of the circulatory system: Secondary | ICD-10-CM | POA: Insufficient documentation

## 2017-01-13 DIAGNOSIS — E039 Hypothyroidism, unspecified: Secondary | ICD-10-CM | POA: Diagnosis not present

## 2017-01-13 DIAGNOSIS — M791 Myalgia: Secondary | ICD-10-CM | POA: Diagnosis not present

## 2017-01-13 DIAGNOSIS — E063 Autoimmune thyroiditis: Secondary | ICD-10-CM | POA: Insufficient documentation

## 2017-01-13 DIAGNOSIS — M25571 Pain in right ankle and joints of right foot: Secondary | ICD-10-CM | POA: Insufficient documentation

## 2017-01-13 DIAGNOSIS — E042 Nontoxic multinodular goiter: Secondary | ICD-10-CM | POA: Diagnosis not present

## 2017-01-13 DIAGNOSIS — M545 Low back pain, unspecified: Secondary | ICD-10-CM

## 2017-01-13 DIAGNOSIS — M170 Bilateral primary osteoarthritis of knee: Secondary | ICD-10-CM

## 2017-01-13 DIAGNOSIS — E559 Vitamin D deficiency, unspecified: Secondary | ICD-10-CM | POA: Insufficient documentation

## 2017-01-13 DIAGNOSIS — M25552 Pain in left hip: Secondary | ICD-10-CM | POA: Diagnosis not present

## 2017-01-13 DIAGNOSIS — Z79891 Long term (current) use of opiate analgesic: Secondary | ICD-10-CM | POA: Diagnosis not present

## 2017-01-13 MED ORDER — HYDROCODONE-ACETAMINOPHEN 10-325 MG PO TABS: 1.0000 | ORAL_TABLET | Freq: Four times a day (QID) | ORAL | 0 refills | Status: DC | PRN

## 2017-01-13 NOTE — Progress Notes (Signed)
Nursing Pain Medication Assessment:  Safety precautions to be maintained throughout the outpatient stay will include: orient to surroundings, keep bed in low position, maintain call bell within reach at all times, provide assistance with transfer out of bed and ambulation.  Medication Inspection Compliance: Pill count conducted under aseptic conditions, in front of the patient. Neither the pills nor the bottle was removed from the patient's sight at any time. Once count was completed pills were immediately returned to the patient in their original bottle.  Medication: See above Pill/Patch Count: 93 of 120 pills remain Pill/Patch Appearance: Markings consistent with prescribed medication Bottle Appearance: Standard pharmacy container. Clearly labeled. Filled Date: 77 / 17 / 2018 Last Medication intake:  Today

## 2017-01-13 NOTE — Progress Notes (Signed)
Patient's Name: Alison Cox  MRN: [id number]  Referring Provider: Lajean Manes, MD  DOB: Apr 24, 1955  PCP: Lajean Manes, MD  DOS: 01/13/2017  Note by: Vevelyn Francois NP  Service setting: Ambulatory outpatient  Specialty: Interventional Pain Management  Location: [facility] (AMB) Pain Management Facility    Patient type: Established    Primary Reason(s) for Visit: Encounter for prescription drug management. (Level of risk: moderate)  CC: Knee Pain (bilateral)  HPI  Alison Cox is a 61 y.o. year old, female patient, who comes today for a medication management evaluation. 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; Long term current use of opiate analgesic; Long term prescription opiate use; Opiate use (40 MME/Day); Encounter for therapeutic drug level monitoring; Opioid dependence ([place]); Lumbar spondylosis; Grade 1 Anterolisthesis of L4 over L5; Therapeutic opioid-induced constipation (OIC); Chronic knee pain (Location of Primary Source of Pain) (Bilateral) (R>L); Osteoarthritis of knees (Location of Primary Source of Pain) (Bilateral) (R>L); Vitamin D insufficiency; History of Bell's palsy (left-sided); History of nephrolithiasis; History of Hashimoto thyroiditis; Osteoarthrosis; Obesity, Class III, BMI 40-49.9 (morbid obesity) (HCC) (254% higher incidence of chronic low back pain); Spondylolysis of lumbosacral region (Advanced disc degeneration and spondylosis L5-S1); Osteoarthritis of ankles (Location of Secondary source of pain) (Bilateral) (L>R); Chronic low back pain (Location of Tertiary source of pain) (midline); Chronic hip pain (Left); Chronic pain syndrome; Musculoskeletal pain; and Chronic ankle pain (Location of Secondary source of pain) (Bilateral) (L>R) on her problem list. Her primarily concern today is the Knee Pain (bilateral)  Pain Assessment: Location: Right, Left Knee Radiating: Na Onset: More than a month  ago Duration: Chronic pain Quality: Aching, Sharp Severity: 1 /10 (self-reported pain score)  Note: Reported level is compatible with observation.                    Effect on ADL: walking long distance, bending, cant get on knees Timing: Intermittent Modifying factors: medications  Alison Cox was last scheduled for an appointment on 10/14/2016 for medication management. During today's appointment we reviewed Alison Cox's chronic pain status, as well as her outpatient medication regimen.  She admits that her pain is stable.   The patient  reports that she does not use drugs. Her body mass index is 52.26 kg/m.  Further details on both, my assessment(s), as well as the proposed treatment plan, please see below.  Controlled Substance Pharmacotherapy Assessment REMS (Risk Evaluation and Mitigation Strategy)  Analgesic:Hydrocodone/APAP 10/325 one tablet by mouth every 6 hours (total: 40 mg/day) MME/day:40 mg/day Alison Specking, RN  01/13/2017 11:24 AM  Sign at close encounter Nursing Pain Medication Assessment:  Safety precautions to be maintained throughout the outpatient stay will include: orient to surroundings, keep bed in low position, maintain call bell within reach at all times, provide assistance with transfer out of bed and ambulation.  Medication Inspection Compliance: Pill count conducted under aseptic conditions, in front of the patient. Neither the pills nor the bottle was removed from the patient's sight at any time. Once count was completed pills were immediately returned to the patient in their original bottle.  Medication: See above Pill/Patch Count: 93 of 120 pills remain Pill/Patch Appearance: Markings consistent with prescribed medication Bottle Appearance: Standard pharmacy container. Clearly labeled. Filled Date: 55 / 17 / 2018 Last Medication intake:  Today   Pharmacokinetics: Liberation and absorption (onset of action): WNL Distribution (time to peak effect):  WNL Metabolism and excretion (duration of  action): WNL         Pharmacodynamics: Desired effects: Analgesia: Alison Cox reports >50% benefit. Functional ability: Patient reports that medication allows her to accomplish basic ADLs Clinically meaningful improvement in function (CMIF): Sustained CMIF goals met Perceived effectiveness: Described as relatively effective, allowing for increase in activities of daily living (ADL) Undesirable effects: Side-effects or Adverse reactions: None reported Monitoring: [place] PMP: Online review of the past 37-monthperiod conducted. Compliant with practice rules and regulations Last UDS on record: Summary  Date Value Ref Range Status  10/14/2016 FINAL  Final    Comment:    ==================================================================== TOXASSURE SELECT 13 (MW) ==================================================================== Test                             Result       Flag       Units Drug Present and Declared for Prescription Verification   Hydrocodone                    1874         EXPECTED   ng/mg creat   Hydromorphone                  503          EXPECTED   ng/mg creat   Dihydrocodeine                 461          EXPECTED   ng/mg creat   Norhydrocodone                 1191         EXPECTED   ng/mg creat    Sources of hydrocodone include scheduled prescription    medications. Hydromorphone, dihydrocodeine and norhydrocodone are    expected metabolites of hydrocodone. Hydromorphone and    dihydrocodeine are also available as scheduled prescription    medications. ==================================================================== Test                      Result    Flag   Units      Ref Range   Creatinine              185              mg/dL      >=20 ==================================================================== Declared Medications:  The flagging and interpretation on this report are based on the  following declared  medications.  Unexpected results may arise from  inaccuracies in the declared medications.  **Note: The testing scope of this panel includes these medications:  Hydrocodone (Hydrocodone-Acetaminophen)  **Note: The testing scope of this panel does not include following  reported medications:  Acetaminophen (Hydrocodone-Acetaminophen)  Amlodipine Besylate  Calcium Carbonate  Cholecalciferol  Doxycycline  Eye Drop  Hydrochlorothiazide  Levothyroxine  Magnesium Oxide  Meloxicam  Potassium  Prednisolone ==================================================================== For clinical consultation, please call ([phone] ====================================================================    UDS interpretation: Compliant          Medication Assessment Form: Reviewed. Patient indicates being compliant with therapy Treatment compliance: Compliant Risk Assessment Profile: Aberrant behavior: See prior evaluations. None observed or detected today Comorbid factors increasing risk of overdose: See prior notes. No additional risks detected today Risk of substance use disorder (SUD): Low     Opioid Risk Tool - 01/13/17 1134      Family History of Substance Abuse   Alcohol Negative  Illegal Drugs Negative   Rx Drugs Negative     Personal History of Substance Abuse   Alcohol Negative   Illegal Drugs Negative   Rx Drugs Negative     Psychological Disease   Psychological Disease Negative   Depression Negative     Total Score   Opioid Risk Tool Scoring 0   Opioid Risk Interpretation Low Risk     ORT Scoring interpretation table:  Score <3 = Low Risk for SUD  Score between 4-7 = Moderate Risk for SUD  Score >8 = High Risk for Opioid Abuse   Risk Mitigation Strategies:  Patient Counseling: Covered Patient-Prescriber Agreement (PPA): Present and active  Notification to other healthcare providers: Done  Pharmacologic Plan: No change in therapy, at this time  Laboratory  Chemistry  Inflammation Markers (CRP: Acute Phase) (ESR: Chronic Phase) Lab Results  Component Value Date   CRP <0.8 05/01/2016   ESRSEDRATE 8 05/01/2016                 Renal Function Markers Lab Results  Component Value Date   BUN 18 05/01/2016   CREATININE 0.82 05/01/2016   GFRAA >60 05/01/2016   GFRNONAA >60 05/01/2016                 Hepatic Function Markers Lab Results  Component Value Date   AST 37 05/01/2016   ALT 47 05/01/2016   ALBUMIN 4.6 05/01/2016   ALKPHOS 93 05/01/2016                 Electrolytes Lab Results  Component Value Date   NA 141 05/01/2016   K 4.0 05/01/2016   CL 106 05/01/2016   CALCIUM 10.0 05/01/2016   MG 2.0 05/01/2016                 Neuropathy Markers Lab Results  Component Value Date   VITAMINB12 302 05/01/2016                 Bone Pathology Markers Lab Results  Component Value Date   ALKPHOS 93 05/01/2016   25OHVITD1 32 05/01/2016   25OHVITD2 2.2 05/01/2016   25OHVITD3 30 05/01/2016   CALCIUM 10.0 05/01/2016                 Coagulation Parameters No results found for: INR, LABPROT, APTT, PLT               Cardiovascular Markers Lab Results  Component Value Date   HGB 15.6 (H) 10/11/2011   HCT 46.0 10/11/2011                 Note: Lab results reviewed.  Recent Diagnostic Imaging Results  US THYROID CLINICAL DATA:  Follow-up thyroid nodules.  EXAM: THYROID ULTRASOUND  TECHNIQUE: Ultrasound examination of the thyroid gland and adjacent soft tissues was performed.  COMPARISON:  Thyroid ultrasound - 09/27/2013 ; 10/04/2012; 02/26/2012; ultrasound-guided right-sided thyroid nodule biopsy - 03/30/2012  FINDINGS: Grossly unchanged diffuse heterogeneity of the thyroid parenchymal echotexture and. No definitive new or enlarging thyroid nodules.  Right thyroid lobe  Measurements: Slightly diminutive in size measuring 3.2 x 1.4 x 2.0 cm, previously, 5.1 x 1.9 x 1.7 cm.  Right, mid, anterior - 1.2 x 1.0 x  2.0 cm - mixed echogenic, solid - grossly unchanged since the 02/2012 examination, previously, 2.0 x 0.9 x 1.4 cm. This nodule was previously biopsied.  Left thyroid lobe  Measurements: Slightly diminutive in size measuring 3.3 x 1.6 x 1.8  cm, unchanged, previously, 5.4 x 2.0 x 1.4 cm.  Left, superior - 0.5 cm - echogenic dystrophic calcification with posterior acoustic shadowing - unchanged since the 02/2012 examination  Isthmus  Thickness: Normal in size measuring 0.6 cm in diameter, unchanged.  There is mild diffuse heterogeneity of the thyroid isthmus without discrete nodule or mass.  Lymphadenopathy  None visualized.  IMPRESSION: 1. No new or enlarging thyroid nodules with all discretely measured thyroid nodules including previously biopsied dominant approximately 2 cm nodule within the right lobe of thyroid appearing unchanged since most remote examination performed 02/2012. 2. Nonspecific apparent mild interval atrophy of both the right and left lobes of the thyroid with the right lobe now measuring 3.2 cm in maximal diameter (previously, 5.1 cm) and the left lobe measuring 3.3 cm (previously, 5.4 cm). Correlation with thyroid function tests is recommended.  Electronically Signed   By: Sandi Mariscal M.D.   On: 10/15/2015 15:26  Note: Imaging results reviewed.          Meds   Current Outpatient Prescriptions:  .  amLODipine (NORVASC) 5 MG tablet, Take 5 mg by mouth daily., Disp: , Rfl:  .  calcium carbonate (CALCIUM 600) 1500 (600 Ca) MG TABS tablet, Take by mouth 2 (two) times daily with a meal., Disp: , Rfl:  .  [employer] MAGNESIUM OXIDE 500 MG TABS, TAKE 1 CAPSULE (500 MG TOTAL) BY MOUTH DAILY., Disp: 90 tablet, Rfl: 0 .  [employer] VITAMIN D 2000 units CAPS, TAKE 1 CAPSULE (2,000 UNITS TOTAL) BY MOUTH DAILY., Disp: 30 each, Rfl: 6 .  doxycycline (DORYX) 100 MG DR capsule, Take 100 mg by mouth 2 (two) times daily., Disp: , Rfl:  .  hydrochlorothiazide (HYDRODIURIL) 25 MG  tablet, Take 25 mg by mouth daily., Disp: , Rfl:  .  [START ON 02/04/2017] HYDROcodone-acetaminophen (NORCO) 10-325 MG tablet, Take 1 tablet by mouth every 6 (six) hours as needed for severe pain., Disp: 120 tablet, Rfl: 0 .  levothyroxine (SYNTHROID, LEVOTHROID) 150 MCG tablet, Take 150 mcg by mouth daily before breakfast., Disp: , Rfl:  .  meloxicam (MOBIC) 15 MG tablet, Take 15 mg by mouth daily., Disp: , Rfl:  .  potassium chloride (KLOR-CON) 8 MEQ tablet, Take 8 mEq by mouth daily., Disp: , Rfl:  .  prednisoLONE acetate (PRED FORTE) 1 % ophthalmic suspension, Place 1 drop into the right eye every 2 (two) hours while awake., Disp: , Rfl:  .  [START ON 03/06/2017] HYDROcodone-acetaminophen (NORCO) 10-325 MG tablet, Take 1 tablet by mouth every 6 (six) hours as needed for severe pain., Disp: 120 tablet, Rfl: 0 .  [START ON 04/05/2017] HYDROcodone-acetaminophen (NORCO) 10-325 MG tablet, Take 1 tablet by mouth every 6 (six) hours as needed for severe pain., Disp: 120 tablet, Rfl: 0 .  Magnesium Oxide 500 MG CAPS, Take 1 capsule (500 mg total) by mouth daily., Disp: 90 capsule, Rfl: 0 .  Wheat Dextrin (BENEFIBER) POWD, Stir 2 teaspoons of Benefiber into 4-8 oz of any non-carbonated beverage or soft food (hot or cold) TID., Disp: 500 g, Rfl: PRN  ROS  Constitutional: Denies any fever or chills Gastrointestinal: No reported hemesis, hematochezia, vomiting, or acute GI distress Musculoskeletal: Denies any acute onset joint swelling, redness, loss of ROM, or weakness Neurological: No reported episodes of acute onset apraxia, aphasia, dysarthria, agnosia, amnesia, paralysis, loss of coordination, or loss of consciousness  Allergies  Alison Cox is allergic to penicillins; shellfish allergy; and sulfa antibiotics.  [place]  Drug: Alison Cox  reports that she does not use drugs. Alcohol:  reports that she does not drink alcohol. Tobacco:  reports that she has never smoked. She has never used smokeless  tobacco. Medical:  has a past medical history of History of Bell's palsy (03/06/2015); History of Hashimoto thyroiditis (03/06/2015); History of nephrolithiasis (03/06/2015); Hypertension; Kidney stone; Macular corneal dystrophy (2005); Osteoarthritis; and Thyroid disease. Surgical: Alison Cox  has a past surgical history that includes Endometrial ablation; DILATATION AND CURETTAGE; Corneal transplant; and Corneal transplant. Family: family history includes Hypertension in her father and mother.  Constitutional Exam  General appearance: Well nourished, well developed, and well hydrated. In no apparent acute distress Vitals:   01/13/17 1119  BP: (!) 153/69  Pulse: 96  Resp: 16  Temp: 98.3 F (36.8 C)  SpO2: 97%  Weight: 295 lb (133.8 kg)  Height: _0  (1.6 m)   BMI Assessment: Estimated body mass index is 52.26 kg/m as calculated from the following:   Height as of this encounter: _1  (1.6 m).   Weight as of this encounter: 295 lb (133.8 kg). Psych/Mental status: Alert, oriented x 3 (person, place, & time)       Eyes: PERLA Respiratory: No evidence of acute respiratory distress  Cervical Spine Area Exam  Skin & Axial Inspection: No masses, redness, edema, swelling, or associated skin lesions Alignment: Symmetrical Functional ROM: Unrestricted ROM      Stability: No instability detected Muscle Tone/Strength: Functionally intact. No obvious neuro-muscular anomalies detected. Sensory (Neurological): Unimpaired Palpation: No palpable anomalies              Upper Extremity (UE) Exam    Side: Right upper extremity  Side: Left upper extremity  Skin & Extremity Inspection: Skin color, temperature, and hair growth are WNL. No peripheral edema or cyanosis. No masses, redness, swelling, asymmetry, or associated skin lesions. No contractures.  Skin & Extremity Inspection: Skin color, temperature, and hair growth are WNL. No peripheral edema or cyanosis. No masses, redness, swelling,  asymmetry, or associated skin lesions. No contractures.  Functional ROM: Unrestricted ROM          Functional ROM: Unrestricted ROM          Muscle Tone/Strength: Functionally intact. No obvious neuro-muscular anomalies detected.  Muscle Tone/Strength: Functionally intact. No obvious neuro-muscular anomalies detected.  Sensory (Neurological): Unimpaired          Sensory (Neurological): Unimpaired          Palpation: No palpable anomalies              Palpation: No palpable anomalies              Specialized Test(s): Deferred         Specialized Test(s): Deferred          Thoracic Spine Area Exam  Skin & Axial Inspection: No masses, redness, or swelling Alignment: Symmetrical Functional ROM: Unrestricted ROM Stability: No instability detected Muscle Tone/Strength: Functionally intact. No obvious neuro-muscular anomalies detected. Sensory (Neurological): Unimpaired Muscle strength & Tone: No palpable anomalies  Lumbar Spine Area Exam  Skin & Axial Inspection: No masses, redness, or swelling Alignment: Symmetrical Functional ROM: Unrestricted ROM      Stability: No instability detected Muscle Tone/Strength: Functionally intact. No obvious neuro-muscular anomalies detected. Sensory (Neurological): Unimpaired Palpation: No palpable anomalies       Provocative Tests: Lumbar Hyperextension and rotation test: evaluation deferred today       Lumbar Lateral bending test: evaluation deferred today  Patrick's Maneuver: evaluation deferred today                    Gait & Posture Assessment  Ambulation: Unassisted Gait: Relatively normal for age and body habitus Posture: WNL   Lower Extremity Exam    Side: Right lower extremity  Side: Left lower extremity  Skin & Extremity Inspection: Crepitus  Skin & Extremity Inspection: Crepitus  Functional ROM: Unrestricted ROM          Functional ROM: Unrestricted ROM          Muscle Tone/Strength: Functionally intact. No obvious neuro-muscular  anomalies detected.  Muscle Tone/Strength: Functionally intact. No obvious neuro-muscular anomalies detected.  Sensory (Neurological): Unimpaired  Sensory (Neurological): Unimpaired  Palpation: No palpable anomalies  Palpation: No palpable anomalies   Assessment  Primary Diagnosis & Pertinent Problem List: The primary encounter diagnosis was Chronic knee pain (Location of Primary Source of Pain) (Bilateral) (R>L). Diagnoses of Primary osteoarthritis of both knees, Chronic low back pain (Location of Tertiary source of pain) (midline), and Chronic pain syndrome were also pertinent to this visit.  Status Diagnosis  Controlled Controlled Controlled 1. Chronic knee pain (Location of Primary Source of Pain) (Bilateral) (R>L)   2. Primary osteoarthritis of both knees   3. Chronic low back pain (Location of Tertiary source of pain) (midline)   4. Chronic pain syndrome     Problems updated and reviewed during this visit: No problems updated. Plan of Care  Pharmacotherapy (Medications Ordered): Meds ordered this encounter  Medications  . HYDROcodone-acetaminophen (NORCO) 10-325 MG tablet    Sig: Take 1 tablet by mouth every 6 (six) hours as needed for severe pain.    Dispense:  120 tablet    Refill:  0    Do not place this medication, or any other prescription from our practice, on "Automatic Refill". Patient may have prescription filled one day early if pharmacy is closed on scheduled refill date. Do not fill until: 02/04/2017 To last until:03/06/2017    Order Specific Question:   Supervising Provider    Answer:   Milinda Pointer [phone]  . HYDROcodone-acetaminophen (NORCO) 10-325 MG tablet    Sig: Take 1 tablet by mouth every 6 (six) hours as needed for severe pain.    Dispense:  120 tablet    Refill:  0    Do not place this medication, or any other prescription from our practice, on "Automatic Refill". Patient may have prescription filled one day early if pharmacy is closed on  scheduled refill date. Do not fill until: 03/06/2017 To last until:04/05/2017    Order Specific Question:   Supervising Provider    Answer:   Milinda Pointer [phone]  . HYDROcodone-acetaminophen (NORCO) 10-325 MG tablet    Sig: Take 1 tablet by mouth every 6 (six) hours as needed for severe pain.    Dispense:  120 tablet    Refill:  0    Do not place this medication, or any other prescription from our practice, on "Automatic Refill". Patient may have prescription filled one day early if pharmacy is closed on scheduled refill date. Do not fill until:04/05/2017 To last until: 05/05/2017    Order Specific Question:   Supervising Provider    Answer:   Milinda Pointer [phone]   New Prescriptions   No medications on file   Medications administered today: Alison Cox had no medications administered during this visit. Lab-work, procedure(s), and/or referral(s): No orders of the defined types were placed in this encounter.  Imaging and/or referral(s): None  Interventional therapies: Planned, scheduled, and/or pending:   Not at this time.   Considering:   None at this time. The patient's body habitus makes any interventional therapy technically challenging and decreases the chances of success. The patient has been informed that once her BMI goes down low 35, we will revisit and offer her other alternatives.   Palliative PRN treatment(s):   None at this time. The patient's body habitus makes any interventional therapy technically challenging and decreases the chances of success. The patient has been informed that once her BMI goes down low 35, we will revisit and offer her other alternatives.   Provider-requested follow-up: Return in 3 months (on 04/27/2017) for MedMgmt.  Future Appointments Date Time Provider [place]  04/27/2017 11:00 AM Vevelyn Francois, NP [facility] None   Primary Care Physician: Lajean Manes, MD Location: [facility] Outpatient Pain Management  Facility Note by: Vevelyn Francois NP Date: 01/13/2017; Time: 12:29 PM  Pain Score Disclaimer: We use the NRS-11 scale. This is a self-reported, subjective measurement of pain severity with only modest accuracy. It is used primarily to identify changes within a particular patient. It must be understood that outpatient pain scales are significantly less accurate that those used for research, where they can be applied under ideal controlled circumstances with minimal exposure to variables. In reality, the score is likely to be a combination of pain intensity and pain affect, where pain affect describes the degree of emotional arousal or changes in action readiness caused by the sensory experience of pain. Factors such as social and work situation, setting, emotional state, anxiety levels, expectation, and prior pain experience may influence pain perception and show large inter-individual differences that may also be affected by time variables.  Patient instructions provided during this appointment: Patient Instructions    ____________________________________________________________________________________________  Medication Rules  Applies to: All patients receiving prescriptions (written or electronic).  Pharmacy of record: Pharmacy where electronic prescriptions will be sent. If written prescriptions are taken to a different pharmacy, please inform the nursing staff. The pharmacy listed in the electronic medical record should be the one where you would like electronic prescriptions to be sent.  Prescription refills: Only during scheduled appointments. Applies to both, written and electronic prescriptions.  NOTE: The following applies primarily to controlled substances (Opioid* Pain Medications).   Patient's responsibilities: 1. Pain Pills: Bring all pain pills to every appointment (except for procedure appointments). 2. Pill Bottles: Bring pills in original pharmacy bottle. Always bring newest  bottle. Bring bottle, even if empty. 3. Medication refills: You are responsible for knowing and keeping track of what medications you need refilled. The day before your appointment, write a list of all prescriptions that need to be refilled. Bring that list to your appointment and give it to the admitting nurse. Prescriptions will be written only during appointments. If you forget a medication, it will not be "Called in", "Faxed", or "electronically sent". You will need to get another appointment to get these prescribed. 4. Prescription Accuracy: You are responsible for carefully inspecting your prescriptions before leaving our office. Have the discharge nurse carefully go over each prescription with you, before taking them home. Make sure that your name is accurately spelled, that your address is correct. Check the name and dose of your medication to make sure it is accurate. Check the number of pills, and the written instructions to make sure they are clear and accurate. Make sure that you are given enough medication to last  until your next medication refill appointment. 5. Taking Medication: Take medication as prescribed. Never take more pills than instructed. Never take medication more frequently than prescribed. Taking less pills or less frequently is permitted and encouraged, when it comes to controlled substances (written prescriptions).  6. Inform other Doctors: Always inform, all of your healthcare providers, of all the medications you take. 7. Pain Medication from other Providers: You are not allowed to accept any additional pain medication from any other Doctor or Healthcare provider. There are two exceptions to this rule. (see below) In the event that you require additional pain medication, you are responsible for notifying us, as stated below. 8. Medication Agreement: You are responsible for carefully reading and following our Medication Agreement. This must be signed before receiving any  prescriptions from our practice. Safely store a copy of your signed Agreement. Violations to the Agreement will result in no further prescriptions. (Additional copies of our Medication Agreement are available upon request.) 9. Laws, Rules, & Regulations: All patients are expected to follow all Federal and [employer], [employer], Rules, [employer]. Ignorance of the Laws does not constitute a valid excuse. The use of any illegal substances is prohibited. 10. Adopted CDC guidelines & recommendations: Target dosing levels will be at or below 60 MME/day. Use of benzodiazepines** is not recommended.  Exceptions: There are only two exceptions to the rule of not receiving pain medications from other Healthcare Providers. 1. Exception #1 (Emergencies): In the event of an emergency (i.e.: accident requiring emergency care), you are allowed to receive additional pain medication. However, you are responsible for: As soon as you are able, call our office [phone], at any time of the day or night, and leave a message stating your name, the date and nature of the emergency, and the name and dose of the medication prescribed. In the event that your call is answered by a member of our staff, make sure to document and save the date, time, and the name of the person that took your information.  2. Exception #2 (Planned Surgery): In the event that you are scheduled by another doctor or dentist to have any type of surgery or procedure, you are allowed (for a period no longer than 30 days), to receive additional pain medication, for the acute post-op pain. However, in this case, you are responsible for picking up a copy of our "Post-op Pain Management for Surgeons" handout, and giving it to your surgeon or dentist. This document is available at our office, and does not require an appointment to obtain it. Simply go to our office during business hours (Monday-Thursday from 8:00 AM to 4:00 PM) (Friday 8:00 AM to 12:00 Noon) or  if you have a scheduled appointment with [place], prior to your surgery, and ask for it by name. In addition, you will need to provide [place] with your name, name of your surgeon, type of surgery, and date of procedure or surgery.  *Opioid medications include: morphine, codeine, oxycodone, oxymorphone, hydrocodone, hydromorphone, meperidine, tramadol, tapentadol, buprenorphine, fentanyl, methadone. **Benzodiazepine medications include: diazepam (Valium), alprazolam (Xanax), clonazepam (Klonopine), lorazepam (Ativan), clorazepate (Tranxene), chlordiazepoxide (Librium), estazolam (Prosom), oxazepam (Serax), temazepam (Restoril), triazolam (Halcion)  ____________________________________________________________________________________________  BMI Assessment: Estimated body mass index is 52.26 kg/m as calculated from the following:   Height as of this encounter: _0  (1.6 m).   Weight as of this encounter: 295 lb (133.8 kg).  BMI interpretation table: BMI level Category Range association with higher incidence of chronic pain  <18 kg/m2  Underweight   18.5-24.9 kg/m2 Ideal body weight   25-29.9 kg/m2 Overweight Increased incidence by 20%  30-34.9 kg/m2 Obese (Class I) Increased incidence by 68%  35-39.9 kg/m2 Severe obesity (Class II) Increased incidence by 136%  >40 kg/m2 Extreme obesity (Class III) Increased incidence by 254%   BMI Readings from Last 4 Encounters:  01/13/17 52.26 kg/m  10/14/16 52.26 kg/m  07/29/16 49.60 kg/m  05/01/16 49.60 kg/m   Wt Readings from Last 4 Encounters:  01/13/17 295 lb (133.8 kg)  10/14/16 295 lb (133.8 kg)  07/29/16 280 lb (127 kg)  05/01/16 280 lb (127 kg)   You were given 3 prescriptions for Hydrocodone today.

## 2017-01-13 NOTE — Patient Instructions (Addendum)
____________________________________________________________________________________________  Medication Rules  Applies to: All patients receiving prescriptions (written or electronic).  Pharmacy of record: Pharmacy where electronic prescriptions will be sent. If written prescriptions are taken to a different pharmacy, please inform the nursing staff. The pharmacy listed in the electronic medical record should be the one where you would like electronic prescriptions to be sent.  Prescription refills: Only during scheduled appointments. Applies to both, written and electronic prescriptions.  NOTE: The following applies primarily to controlled substances (Opioid* Pain Medications).   Patient's responsibilities: 1. Pain Pills: Bring all pain pills to every appointment (except for procedure appointments). 2. Pill Bottles: Bring pills in original pharmacy bottle. Always bring newest bottle. Bring bottle, even if empty. 3. Medication refills: You are responsible for knowing and keeping track of what medications you need refilled. The day before your appointment, write a list of all prescriptions that need to be refilled. Bring that list to your appointment and give it to the admitting nurse. Prescriptions will be written only during appointments. If you forget a medication, it will not be "Called in", "Faxed", or "electronically sent". You will need to get another appointment to get these prescribed. 4. Prescription Accuracy: You are responsible for carefully inspecting your prescriptions before leaving our office. Have the discharge nurse carefully go over each prescription with you, before taking them home. Make sure that your name is accurately spelled, that your address is correct. Check the name and dose of your medication to make sure it is accurate. Check the number of pills, and the written instructions to make sure they are clear and accurate. Make sure that you are given enough medication to  last until your next medication refill appointment. 5. Taking Medication: Take medication as prescribed. Never take more pills than instructed. Never take medication more frequently than prescribed. Taking less pills or less frequently is permitted and encouraged, when it comes to controlled substances (written prescriptions).  6. Inform other Doctors: Always inform, all of your healthcare providers, of all the medications you take. 7. Pain Medication from other Providers: You are not allowed to accept any additional pain medication from any other Doctor or Healthcare provider. There are two exceptions to this rule. (see below) In the event that you require additional pain medication, you are responsible for notifying us, as stated below. 8. Medication Agreement: You are responsible for carefully reading and following our Medication Agreement. This must be signed before receiving any prescriptions from our practice. Safely store a copy of your signed Agreement. Violations to the Agreement will result in no further prescriptions. (Additional copies of our Medication Agreement are available upon request.) 9. Laws, Rules, & Regulations: All patients are expected to follow all Federal and State Laws, Statutes, Rules, & Regulations. Ignorance of the Laws does not constitute a valid excuse. The use of any illegal substances is prohibited. 10. Adopted CDC guidelines & recommendations: Target dosing levels will be at or below 60 MME/day. Use of benzodiazepines** is not recommended.  Exceptions: There are only two exceptions to the rule of not receiving pain medications from other Healthcare Providers. 1. Exception #1 (Emergencies): In the event of an emergency (i.e.: accident requiring emergency care), you are allowed to receive additional pain medication. However, you are responsible for: As soon as you are able, call our office [phone], at any time of the day or night, and leave a message stating your  name, the date and nature of the emergency, and the name and dose of the medication   prescribed. In the event that your call is answered by a member of our staff, make sure to document and save the date, time, and the name of the person that took your information.  2. Exception #2 (Planned Surgery): In the event that you are scheduled by another doctor or dentist to have any type of surgery or procedure, you are allowed (for a period no longer than 30 days), to receive additional pain medication, for the acute post-op pain. However, in this case, you are responsible for picking up a copy of our "Post-op Pain Management for Surgeons" handout, and giving it to your surgeon or dentist. This document is available at our office, and does not require an appointment to obtain it. Simply go to our office during business hours (Monday-Thursday from 8:00 AM to 4:00 PM) (Friday 8:00 AM to 12:00 Noon) or if you have a scheduled appointment with [place], prior to your surgery, and ask for it by name. In addition, you will need to provide [place] with your name, name of your surgeon, type of surgery, and date of procedure or surgery.  *Opioid medications include: morphine, codeine, oxycodone, oxymorphone, hydrocodone, hydromorphone, meperidine, tramadol, tapentadol, buprenorphine, fentanyl, methadone. **Benzodiazepine medications include: diazepam (Valium), alprazolam (Xanax), clonazepam (Klonopine), lorazepam (Ativan), clorazepate (Tranxene), chlordiazepoxide (Librium), estazolam (Prosom), oxazepam (Serax), temazepam (Restoril), triazolam (Halcion)  ____________________________________________________________________________________________  BMI Assessment: Estimated body mass index is 52.26 kg/m as calculated from the following:   Height as of this encounter: 5\' 3"  (1.6 m).   Weight as of this encounter: 295 lb (133.8 kg).  BMI interpretation table: BMI level Category Range association with higher incidence of chronic pain   <18 kg/m2 Underweight   18.5-24.9 kg/m2 Ideal body weight   25-29.9 kg/m2 Overweight Increased incidence by 20%  30-34.9 kg/m2 Obese (Class I) Increased incidence by 68%  35-39.9 kg/m2 Severe obesity (Class II) Increased incidence by 136%  >40 kg/m2 Extreme obesity (Class III) Increased incidence by 254%   BMI Readings from Last 4 Encounters:  01/13/17 52.26 kg/m  10/14/16 52.26 kg/m  07/29/16 49.60 kg/m  05/01/16 49.60 kg/m   Wt Readings from Last 4 Encounters:  01/13/17 295 lb (133.8 kg)  10/14/16 295 lb (133.8 kg)  07/29/16 280 lb (127 kg)  05/01/16 280 lb (127 kg)   You were given 3 prescriptions for Hydrocodone today.

## 2017-03-23 DIAGNOSIS — E038 Other specified hypothyroidism: Secondary | ICD-10-CM | POA: Diagnosis not present

## 2017-03-23 DIAGNOSIS — E063 Autoimmune thyroiditis: Secondary | ICD-10-CM | POA: Diagnosis not present

## 2017-04-27 ENCOUNTER — Ambulatory Visit: Payer: 59 | Attending: Nurse Practitioner | Admitting: Nurse Practitioner

## 2017-04-27 ENCOUNTER — Other Ambulatory Visit: Payer: Self-pay

## 2017-04-27 ENCOUNTER — Encounter: Payer: Self-pay | Admitting: Nurse Practitioner

## 2017-04-27 VITALS — BP 160/81 | HR 107 | Temp 97.8°F | Resp 18 | Ht 63.0 in | Wt 300.0 lb

## 2017-04-27 DIAGNOSIS — E559 Vitamin D deficiency, unspecified: Secondary | ICD-10-CM | POA: Insufficient documentation

## 2017-04-27 DIAGNOSIS — Z79891 Long term (current) use of opiate analgesic: Secondary | ICD-10-CM | POA: Diagnosis not present

## 2017-04-27 DIAGNOSIS — M25552 Pain in left hip: Secondary | ICD-10-CM | POA: Insufficient documentation

## 2017-04-27 DIAGNOSIS — Z8249 Family history of ischemic heart disease and other diseases of the circulatory system: Secondary | ICD-10-CM | POA: Insufficient documentation

## 2017-04-27 DIAGNOSIS — M47817 Spondylosis without myelopathy or radiculopathy, lumbosacral region: Secondary | ICD-10-CM | POA: Insufficient documentation

## 2017-04-27 DIAGNOSIS — E042 Nontoxic multinodular goiter: Secondary | ICD-10-CM | POA: Diagnosis not present

## 2017-04-27 DIAGNOSIS — G510 Bell's palsy: Secondary | ICD-10-CM | POA: Diagnosis not present

## 2017-04-27 DIAGNOSIS — M25562 Pain in left knee: Secondary | ICD-10-CM

## 2017-04-27 DIAGNOSIS — M170 Bilateral primary osteoarthritis of knee: Secondary | ICD-10-CM | POA: Diagnosis not present

## 2017-04-27 DIAGNOSIS — G894 Chronic pain syndrome: Secondary | ICD-10-CM | POA: Diagnosis not present

## 2017-04-27 DIAGNOSIS — I10 Essential (primary) hypertension: Secondary | ICD-10-CM | POA: Insufficient documentation

## 2017-04-27 DIAGNOSIS — Z947 Corneal transplant status: Secondary | ICD-10-CM | POA: Insufficient documentation

## 2017-04-27 DIAGNOSIS — Z882 Allergy status to sulfonamides status: Secondary | ICD-10-CM | POA: Insufficient documentation

## 2017-04-27 DIAGNOSIS — E063 Autoimmune thyroiditis: Secondary | ICD-10-CM | POA: Insufficient documentation

## 2017-04-27 DIAGNOSIS — Z87442 Personal history of urinary calculi: Secondary | ICD-10-CM | POA: Insufficient documentation

## 2017-04-27 DIAGNOSIS — M7918 Myalgia, other site: Secondary | ICD-10-CM | POA: Diagnosis not present

## 2017-04-27 DIAGNOSIS — M25572 Pain in left ankle and joints of left foot: Secondary | ICD-10-CM | POA: Diagnosis not present

## 2017-04-27 DIAGNOSIS — M545 Low back pain, unspecified: Secondary | ICD-10-CM

## 2017-04-27 DIAGNOSIS — Z880 Allergy status to penicillin: Secondary | ICD-10-CM | POA: Diagnosis not present

## 2017-04-27 DIAGNOSIS — Z91013 Allergy to seafood: Secondary | ICD-10-CM | POA: Insufficient documentation

## 2017-04-27 DIAGNOSIS — Z79899 Other long term (current) drug therapy: Secondary | ICD-10-CM | POA: Diagnosis not present

## 2017-04-27 DIAGNOSIS — M25561 Pain in right knee: Secondary | ICD-10-CM

## 2017-04-27 DIAGNOSIS — M25571 Pain in right ankle and joints of right foot: Secondary | ICD-10-CM | POA: Diagnosis not present

## 2017-04-27 DIAGNOSIS — Z9889 Other specified postprocedural states: Secondary | ICD-10-CM | POA: Diagnosis not present

## 2017-04-27 DIAGNOSIS — G8929 Other chronic pain: Secondary | ICD-10-CM | POA: Diagnosis not present

## 2017-04-27 MED ORDER — HYDROCODONE-ACETAMINOPHEN 10-325 MG PO TABS: 1.0000 | ORAL_TABLET | Freq: Four times a day (QID) | ORAL | 0 refills | Status: DC | PRN

## 2017-04-27 MED ORDER — MAGNESIUM OXIDE -MG SUPPLEMENT 500 MG PO CAPS
1.0000 | ORAL_CAPSULE | Freq: Every day | ORAL | 0 refills | Status: DC
Start: 2017-04-27 — End: 2017-07-23

## 2017-04-27 MED ORDER — CHOLECALCIFEROL 50 MCG (2000 UT) PO CAPS: 2000.0000 [IU] | ORAL_CAPSULE | Freq: Every day | ORAL | 6 refills | Status: DC

## 2017-04-27 NOTE — Patient Instructions (Addendum)
____________________________________________________________________________________________  Medication Rules  Applies to: All patients receiving prescriptions (written or electronic).  Pharmacy of record: Pharmacy where electronic prescriptions will be sent. If written prescriptions are taken to a different pharmacy, please inform the nursing staff. The pharmacy listed in the electronic medical record should be the one where you would like electronic prescriptions to be sent.  Prescription refills: Only during scheduled appointments. Applies to both, written and electronic prescriptions.  NOTE: The following applies primarily to controlled substances (Opioid* Pain Medications).   Patient's responsibilities: 1. Pain Pills: Bring all pain pills to every appointment (except for procedure appointments). 2. Pill Bottles: Bring pills in original pharmacy bottle. Always bring newest bottle. Bring bottle, even if empty. 3. Medication refills: You are responsible for knowing and keeping track of what medications you need refilled. The day before your appointment, write a list of all prescriptions that need to be refilled. Bring that list to your appointment and give it to the admitting nurse. Prescriptions will be written only during appointments. If you forget a medication, it will not be "Called in", "Faxed", or "electronically sent". You will need to get another appointment to get these prescribed. 4. Prescription Accuracy: You are responsible for carefully inspecting your prescriptions before leaving our office. Have the discharge nurse carefully go over each prescription with you, before taking them home. Make sure that your name is accurately spelled, that your address is correct. Check the name and dose of your medication to make sure it is accurate. Check the number of pills, and the written instructions to make sure they are clear and accurate. Make sure that you are given enough medication to  last until your next medication refill appointment. 5. Taking Medication: Take medication as prescribed. Never take more pills than instructed. Never take medication more frequently than prescribed. Taking less pills or less frequently is permitted and encouraged, when it comes to controlled substances (written prescriptions).  6. Inform other Doctors: Always inform, all of your healthcare providers, of all the medications you take. 7. Pain Medication from other Providers: You are not allowed to accept any additional pain medication from any other Doctor or Healthcare provider. There are two exceptions to this rule. (see below) In the event that you require additional pain medication, you are responsible for notifying us, as stated below. 8. Medication Agreement: You are responsible for carefully reading and following our Medication Agreement. This must be signed before receiving any prescriptions from our practice. Safely store a copy of your signed Agreement. Violations to the Agreement will result in no further prescriptions. (Additional copies of our Medication Agreement are available upon request.) 9. Laws, Rules, & Regulations: All patients are expected to follow all Federal and State Laws, Statutes, Rules, & Regulations. Ignorance of the Laws does not constitute a valid excuse. The use of any illegal substances is prohibited. 10. Adopted CDC guidelines & recommendations: Target dosing levels will be at or below 60 MME/day. Use of benzodiazepines** is not recommended.  Exceptions: There are only two exceptions to the rule of not receiving pain medications from other Healthcare Providers. 1. Exception #1 (Emergencies): In the event of an emergency (i.e.: accident requiring emergency care), you are allowed to receive additional pain medication. However, you are responsible for: As soon as you are able, call our office [phone], at any time of the day or night, and leave a message stating your  name, the date and nature of the emergency, and the name and dose of the medication   prescribed. In the event that your call is answered by a member of our staff, make sure to document and save the date, time, and the name of the person that took your information.  2. Exception #2 (Planned Surgery): In the event that you are scheduled by another doctor or dentist to have any type of surgery or procedure, you are allowed (for a period no longer than 30 days), to receive additional pain medication, for the acute post-op pain. However, in this case, you are responsible for picking up a copy of our "Post-op Pain Management for Surgeons" handout, and giving it to your surgeon or dentist. This document is available at our office, and does not require an appointment to obtain it. Simply go to our office during business hours (Monday-Thursday from 8:00 AM to 4:00 PM) (Friday 8:00 AM to 12:00 Noon) or if you have a scheduled appointment with [place], prior to your surgery, and ask for it by name. In addition, you will need to provide [place] with your name, name of your surgeon, type of surgery, and date of procedure or surgery.  *Opioid medications include: morphine, codeine, oxycodone, oxymorphone, hydrocodone, hydromorphone, meperidine, tramadol, tapentadol, buprenorphine, fentanyl, methadone. **Benzodiazepine medications include: diazepam (Valium), alprazolam (Xanax), clonazepam (Klonopine), lorazepam (Ativan), clorazepate (Tranxene), chlordiazepoxide (Librium), estazolam (Prosom), oxazepam (Serax), temazepam (Restoril), triazolam (Halcion)  You were given 3 prescriptions for Hydrocodone today.  ____________________________________________________________________________________________

## 2017-04-27 NOTE — Progress Notes (Signed)
Nursing Pain Medication Assessment:  Safety precautions to be maintained throughout the outpatient stay will include: orient to surroundings, keep bed in low position, maintain call bell within reach at all times, provide assistance with transfer out of bed and ambulation.  Medication Inspection Compliance: Pill count conducted under aseptic conditions, in front of the patient. Neither the pills nor the bottle was removed from the patient's sight at any time. Once count was completed pills were immediately returned to the patient in their original bottle.  Medication: Hydrocodone/APAP Pill/Patch Count: 32.5 of 120 pills remain Pill/Patch Appearance: Markings consistent with prescribed medication Bottle Appearance: Standard pharmacy container. Clearly labeled. Filled Date: 12/ 17 / 2018 Last Medication intake:  Today

## 2017-04-27 NOTE — Progress Notes (Signed)
Patient's Name: Alison Cox  MRN: [id number]  Referring Provider: Lajean Manes, MD  DOB: February 07, 1956  PCP: Lajean Manes, MD  DOS: 04/27/2017  Note by: Vevelyn Francois NP  Service setting: Ambulatory outpatient  Specialty: Interventional Pain Management  Location: [facility] (AMB) Pain Management Facility    Patient type: Established    Primary Reason(s) for Visit: Encounter for prescription drug management. (Level of risk: moderate)  CC: Knee Pain (bilateral, left is worse)  HPI  Alison Cox is a 62 y.o. year old, female patient, who comes today for a medication management evaluation. 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; Long term current use of opiate analgesic; Long term prescription opiate use; Opiate use (40 MME/Day); Encounter for therapeutic drug level monitoring; Opioid dependence ([place]); Lumbar spondylosis; Grade 1 Anterolisthesis of L4 over L5; Therapeutic opioid-induced constipation (OIC); Chronic knee pain (Location of Primary Source of Pain) (Bilateral) (R>L); Osteoarthritis of knees (Location of Primary Source of Pain) (Bilateral) (R>L); Vitamin D insufficiency; History of Bell's palsy (left-sided); History of nephrolithiasis; History of Hashimoto thyroiditis; Osteoarthrosis; Obesity, Class III, BMI 40-49.9 (morbid obesity) (HCC) (254% higher incidence of chronic low back pain); Spondylolysis of lumbosacral region (Advanced disc degeneration and spondylosis L5-S1); Osteoarthritis of ankles (Location of Secondary source of pain) (Bilateral) (L>R); Chronic low back pain (Location of Tertiary source of pain) (midline); Chronic hip pain (Left); Chronic pain syndrome; Musculoskeletal pain; and Chronic ankle pain (Location of Secondary source of pain) (Bilateral) (L>R) on their problem list. Her primarily concern today is the Knee Pain (bilateral, left is worse)  Pain Assessment: Location: Right, Left Knee Radiating:   denies Onset: More than a month ago Duration: Chronic pain Quality: Aching, Sharp, Discomfort Severity: 3 /10 (self-reported pain score)  Note: Reported level is compatible with observation.                          Effect on ADL:   Timing: Constant Modifying factors: rest  Alison Cox was last scheduled for an appointment on 01/13/2017 for medication management. During today's appointment we reviewed Alison Cox's chronic pain status, as well as her outpatient medication regimen. She admits that her pain is stable. She denies any concerns today.   The patient  reports that she does not use drugs. Her body mass index is 53.14 kg/m.  Further details on both, my assessment(s), as well as the proposed treatment plan, please see below.  Controlled Substance Pharmacotherapy Assessment REMS (Risk Evaluation and Mitigation Strategy)  Analgesic:Hydrocodone/APAP 10/325 one tablet by mouth every 6 hours (total: 40 mg/day) MME/day:40 mg/day   Alison Shorter, RN  04/27/2017 11:22 AM  Signed Nursing Pain Medication Assessment:  Safety precautions to be maintained throughout the outpatient stay will include: orient to surroundings, keep bed in low position, maintain call bell within reach at all times, provide assistance with transfer out of bed and ambulation.  Medication Inspection Compliance: Pill count conducted under aseptic conditions, in front of the patient. Neither the pills nor the bottle was removed from the patient's sight at any time. Once count was completed pills were immediately returned to the patient in their original bottle.  Medication: Hydrocodone/APAP Pill/Patch Count: 32.5 of 120 pills remain Pill/Patch Appearance: Markings consistent with prescribed medication Bottle Appearance: Standard pharmacy container. Clearly labeled. Filled Date: 12/ 17 / 2018 Last Medication intake:  Today   Pharmacokinetics: Liberation and absorption (onset of action): WNL Distribution (time to  peak  effect): WNL Metabolism and excretion (duration of action): WNL         Pharmacodynamics: Desired effects: Analgesia: Alison Cox reports >50% benefit. Functional ability: Patient reports that medication allows her to accomplish basic ADLs Clinically meaningful improvement in function (CMIF): Sustained CMIF goals met Perceived effectiveness: Described as relatively effective, allowing for increase in activities of daily living (ADL) Undesirable effects: Side-effects or Adverse reactions: None reported Monitoring: [place] PMP: Online review of the past 85-monthperiod conducted. Compliant with practice rules and regulations Last UDS on record: Summary  Date Value Ref Range Status  10/14/2016 FINAL  Final    Comment:    ==================================================================== TOXASSURE SELECT 13 (MW) ==================================================================== Test                             Result       Flag       Units Drug Present and Declared for Prescription Verification   Hydrocodone                    1874         EXPECTED   ng/mg creat   Hydromorphone                  503          EXPECTED   ng/mg creat   Dihydrocodeine                 461          EXPECTED   ng/mg creat   Norhydrocodone                 1191         EXPECTED   ng/mg creat    Sources of hydrocodone include scheduled prescription    medications. Hydromorphone, dihydrocodeine and norhydrocodone are    expected metabolites of hydrocodone. Hydromorphone and    dihydrocodeine are also available as scheduled prescription    medications. ==================================================================== Test                      Result    Flag   Units      Ref Range   Creatinine              185              mg/dL      >=20 ==================================================================== Declared Medications:  The flagging and interpretation on this report are based on the  following declared  medications.  Unexpected results may arise from  inaccuracies in the declared medications.  **Note: The testing scope of this panel includes these medications:  Hydrocodone (Hydrocodone-Acetaminophen)  **Note: The testing scope of this panel does not include following  reported medications:  Acetaminophen (Hydrocodone-Acetaminophen)  Amlodipine Besylate  Calcium Carbonate  Cholecalciferol  Doxycycline  Eye Drop  Hydrochlorothiazide  Levothyroxine  Magnesium Oxide  Meloxicam  Potassium  Prednisolone ==================================================================== For clinical consultation, please call ([phone] ====================================================================    UDS interpretation: Compliant          Medication Assessment Form: Reviewed. Patient indicates being compliant with therapy Treatment compliance: Compliant Risk Assessment Profile: Aberrant behavior: See prior evaluations. None observed or detected today Comorbid factors increasing risk of overdose: See prior notes. No additional risks detected today Risk of substance use disorder (SUD): Low Opioid Risk Tool - 04/27/17 1120      Family History of Substance  Abuse   Alcohol  Negative    Illegal Drugs  Negative    Rx Drugs  Negative      Personal History of Substance Abuse   Alcohol  Negative    Illegal Drugs  Negative    Rx Drugs  Negative      Age   Age between 42-45 years   No      History of Preadolescent Sexual Abuse   History of Preadolescent Sexual Abuse  Negative or Female      Psychological Disease   Psychological Disease  Negative    Depression  Negative      Total Score   Opioid Risk Tool Scoring  0    Opioid Risk Interpretation  Low Risk      ORT Scoring interpretation table:  Score <3 = Low Risk for SUD  Score between 4-7 = Moderate Risk for SUD  Score >8 = High Risk for Opioid Abuse   Risk Mitigation Strategies:  Patient Counseling:  Covered Patient-Prescriber Agreement (PPA): Present and active  Notification to other healthcare providers: Done  Pharmacologic Plan: No change in therapy, at this time.             Laboratory Chemistry  Inflammation Markers (CRP: Acute Phase) (ESR: Chronic Phase) Lab Results  Component Value Date   CRP <0.8 05/01/2016   ESRSEDRATE 8 05/01/2016                 Rheumatology Markers No results found for: Elayne Guerin, [facility]              Renal Function Markers Lab Results  Component Value Date   BUN 18 05/01/2016   CREATININE 0.82 05/01/2016   GFRAA >60 05/01/2016   GFRNONAA >60 05/01/2016                 Hepatic Function Markers Lab Results  Component Value Date   AST 37 05/01/2016   ALT 47 05/01/2016   ALBUMIN 4.6 05/01/2016   ALKPHOS 93 05/01/2016                 Electrolytes Lab Results  Component Value Date   NA 141 05/01/2016   K 4.0 05/01/2016   CL 106 05/01/2016   CALCIUM 10.0 05/01/2016   MG 2.0 05/01/2016                 Neuropathy Markers Lab Results  Component Value Date   VITAMINB12 302 05/01/2016                 Bone Pathology Markers Lab Results  Component Value Date   25OHVITD1 32 05/01/2016   25OHVITD2 2.2 05/01/2016   25OHVITD3 30 05/01/2016                 Coagulation Parameters No results found for: INR, LABPROT, APTT, PLT, DDIMER               Cardiovascular Markers Lab Results  Component Value Date   HGB 15.6 (H) 10/11/2011   HCT 46.0 10/11/2011                 CA Markers No results found for: CEA, CA125, LABCA2               Note: Lab results reviewed.  Recent Diagnostic Imaging Results  US THYROID CLINICAL DATA:  Follow-up thyroid nodules.  EXAM: THYROID ULTRASOUND  TECHNIQUE: Ultrasound examination of the thyroid gland and adjacent soft tissues  was performed.  COMPARISON:  Thyroid ultrasound - 09/27/2013 ; 10/04/2012; 02/26/2012; ultrasound-guided right-sided thyroid nodule  biopsy - 03/30/2012  FINDINGS: Grossly unchanged diffuse heterogeneity of the thyroid parenchymal echotexture and. No definitive new or enlarging thyroid nodules.  Right thyroid lobe  Measurements: Slightly diminutive in size measuring 3.2 x 1.4 x 2.0 cm, previously, 5.1 x 1.9 x 1.7 cm.  Right, mid, anterior - 1.2 x 1.0 x 2.0 cm - mixed echogenic, solid - grossly unchanged since the 02/2012 examination, previously, 2.0 x 0.9 x 1.4 cm. This nodule was previously biopsied.  Left thyroid lobe  Measurements: Slightly diminutive in size measuring 3.3 x 1.6 x 1.8 cm, unchanged, previously, 5.4 x 2.0 x 1.4 cm.  Left, superior - 0.5 cm - echogenic dystrophic calcification with posterior acoustic shadowing - unchanged since the 02/2012 examination  Isthmus  Thickness: Normal in size measuring 0.6 cm in diameter, unchanged.  There is mild diffuse heterogeneity of the thyroid isthmus without discrete nodule or mass.  Lymphadenopathy  None visualized.  IMPRESSION: 1. No new or enlarging thyroid nodules with all discretely measured thyroid nodules including previously biopsied dominant approximately 2 cm nodule within the right lobe of thyroid appearing unchanged since most remote examination performed 02/2012. 2. Nonspecific apparent mild interval atrophy of both the right and left lobes of the thyroid with the right lobe now measuring 3.2 cm in maximal diameter (previously, 5.1 cm) and the left lobe measuring 3.3 cm (previously, 5.4 cm). Correlation with thyroid function tests is recommended.  Electronically Signed   By: Sandi Mariscal M.D.   On: 10/15/2015 15:26  Complexity Note: Imaging results reviewed. Results shared with Ms. [name], using Layman's terms.                         Meds   Current Outpatient Medications:  .  amLODipine (NORVASC) 5 MG tablet, Take 5 mg by mouth daily., Disp: , Rfl:  .  calcium carbonate (CALCIUM 600) 1500 (600 Ca) MG TABS tablet, Take by  mouth 2 (two) times daily with a meal., Disp: , Rfl:  .  Cholecalciferol (CVS VITAMIN D) 2000 units CAPS, Take 1 capsule (2,000 Units total) by mouth daily., Disp: 30 each, Rfl: 6 .  doxycycline (DORYX) 100 MG DR capsule, Take 100 mg by mouth 2 (two) times daily., Disp: , Rfl:  .  hydrochlorothiazide (HYDRODIURIL) 25 MG tablet, Take 25 mg by mouth daily., Disp: , Rfl:  .  [START ON 07/05/2017] HYDROcodone-acetaminophen (NORCO) 10-325 MG tablet, Take 1 tablet by mouth every 6 (six) hours as needed for severe pain., Disp: 120 tablet, Rfl: 0 .  levothyroxine (SYNTHROID, LEVOTHROID) 150 MCG tablet, Take 150 mcg by mouth daily before breakfast., Disp: , Rfl:  .  meloxicam (MOBIC) 15 MG tablet, Take 15 mg by mouth daily., Disp: , Rfl:  .  potassium chloride (KLOR-CON) 8 MEQ tablet, Take 8 mEq by mouth daily., Disp: , Rfl:  .  prednisoLONE acetate (PRED FORTE) 1 % ophthalmic suspension, Place 1 drop into the right eye every 2 (two) hours while awake., Disp: , Rfl:  .  CVS MAGNESIUM OXIDE 500 MG TABS, TAKE 1 CAPSULE (500 MG TOTAL) BY MOUTH DAILY., Disp: 90 tablet, Rfl: 0 .  [START ON 06/05/2017] HYDROcodone-acetaminophen (NORCO) 10-325 MG tablet, Take 1 tablet by mouth every 6 (six) hours as needed for severe pain., Disp: 120 tablet, Rfl: 0 .  [START ON 05/06/2017] HYDROcodone-acetaminophen (NORCO) 10-325 MG tablet, Take 1 tablet by  mouth every 6 (six) hours as needed for severe pain., Disp: 120 tablet, Rfl: 0 .  Magnesium Oxide 500 MG CAPS, Take 1 capsule (500 mg total) by mouth daily., Disp: 90 capsule, Rfl: 0 .  Wheat Dextrin (BENEFIBER) POWD, Stir 2 teaspoons of Benefiber into 4-8 oz of any non-carbonated beverage or soft food (hot or cold) TID., Disp: 500 g, Rfl: PRN  ROS  Constitutional: Denies any fever or chills Gastrointestinal: No reported hemesis, hematochezia, vomiting, or acute GI distress Musculoskeletal: Denies any acute onset joint swelling, redness, loss of ROM, or weakness Neurological: No  reported episodes of acute onset apraxia, aphasia, dysarthria, agnosia, amnesia, paralysis, loss of coordination, or loss of consciousness  Allergies  Ms. Beilfuss is allergic to penicillins; shellfish allergy; and sulfa antibiotics.  [place]  Drug: Ms. Wiltsie  reports that she does not use drugs. Alcohol:  reports that she does not drink alcohol. Tobacco:  reports that  has never smoked. she has never used smokeless tobacco. Medical:  has a past medical history of History of Bell's palsy (03/06/2015), History of Hashimoto thyroiditis (03/06/2015), History of nephrolithiasis (03/06/2015), Hypertension, Kidney stone, Macular corneal dystrophy (2005), Osteoarthritis, and Thyroid disease. Surgical: Ms. Prazak  has a past surgical history that includes Endometrial ablation; DILATATION AND CURETTAGE; Corneal transplant; and Corneal transplant. Family: family history includes Hypertension in her father and mother.  Constitutional Exam  General appearance: Well nourished, well developed, and well hydrated. In no apparent acute distress Vitals:   04/27/17 1114  BP: (!) 160/81  Pulse: (!) 107  Resp: 18  Temp: 97.8 F (36.6 C)  SpO2: 99%  Weight: 300 lb (136.1 kg)  Height: '5\' 3"'  (1.6 m)   BMI Assessment: Estimated body mass index is 53.14 kg/m as calculated from the following:   Height as of this encounter: '5\' 3"'  (1.6 m).   Weight as of this encounter: 300 lb (136.1 kg). Psych/Mental status: Alert, oriented x 3 (person, place, & time)       Eyes: PERLA Respiratory: No evidence of acute respiratory distress  Lumbar Spine Area Exam  Skin & Axial Inspection: No masses, redness, or swelling Alignment: Symmetrical Functional ROM: Unrestricted ROM      Stability: No instability detected Muscle Tone/Strength: Functionally intact. No obvious neuro-muscular anomalies detected. Sensory (Neurological): Unimpaired Palpation: No palpable anomalies       Provocative Tests: Lumbar Hyperextension and  rotation test: evaluation deferred today       Lumbar Lateral bending test: evaluation deferred today       Patrick's Maneuver: evaluation deferred today                    Gait & Posture Assessment  Ambulation: Unassisted Gait: Relatively normal for age and body habitus Posture: WNL   Lower Extremity Exam    Side: Right lower extremity  Side: Left lower extremity  Skin & Extremity Inspection: Skin color, temperature, and hair growth are WNL. No peripheral edema or cyanosis. No masses, redness, swelling, asymmetry, or associated skin lesions. No contractures.  Skin & Extremity Inspection: Skin color, temperature, and hair growth are WNL. No peripheral edema or cyanosis. No masses, redness, swelling, asymmetry, or associated skin lesions. No contractures.  Functional ROM: Decreased ROM          Functional ROM: Decreased ROM          Muscle Tone/Strength: Functionally intact. No obvious neuro-muscular anomalies detected.  Muscle Tone/Strength: Functionally intact. No obvious neuro-muscular anomalies detected.  Sensory (Neurological): Unimpaired  Sensory (Neurological): Unimpaired  Palpation: No palpable anomalies  Palpation: No palpable anomalies   Assessment  Primary Diagnosis & Pertinent Problem List: The primary encounter diagnosis was Chronic low back pain (Location of Tertiary source of pain) (midline). Diagnoses of Chronic knee pain (Location of Primary Source of Pain) (Bilateral) (R>L), Chronic ankle pain (Location of Secondary source of pain) (Bilateral) (L>R), Chronic pain syndrome, Musculoskeletal pain, Vitamin D insufficiency, and Long term current use of opiate analgesic were also pertinent to this visit.  Status Diagnosis  Controlled Controlled Controlled 1. Chronic low back pain (Location of Tertiary source of pain) (midline)   2. Chronic knee pain (Location of Primary Source of Pain) (Bilateral) (R>L)   3. Chronic ankle pain (Location of Secondary source of pain) (Bilateral)  (L>R)   4. Chronic pain syndrome   5. Musculoskeletal pain   6. Vitamin D insufficiency   7. Long term current use of opiate analgesic     Problems updated and reviewed during this visit: No problems updated. Plan of Care  Pharmacotherapy (Medications Ordered): Meds ordered this encounter  Medications  . HYDROcodone-acetaminophen (NORCO) 10-325 MG tablet    Sig: Take 1 tablet by mouth every 6 (six) hours as needed for severe pain.    Dispense:  120 tablet    Refill:  0    Do not place this medication, or any other prescription from our practice, on "Automatic Refill". Patient may have prescription filled one day early if pharmacy is closed on scheduled refill date. Do not fill until:07/05/2017 To last until: 08/04/2017    Order Specific Question:   Supervising Provider    Answer:   Milinda Pointer [phone]  . HYDROcodone-acetaminophen (NORCO) 10-325 MG tablet    Sig: Take 1 tablet by mouth every 6 (six) hours as needed for severe pain.    Dispense:  120 tablet    Refill:  0    Do not place this medication, or any other prescription from our practice, on "Automatic Refill". Patient may have prescription filled one day early if pharmacy is closed on scheduled refill date. Do not fill until:06/05/2017 To last until:07/05/2017    Order Specific Question:   Supervising Provider    Answer:   Milinda Pointer [phone]  . HYDROcodone-acetaminophen (NORCO) 10-325 MG tablet    Sig: Take 1 tablet by mouth every 6 (six) hours as needed for severe pain.    Dispense:  120 tablet    Refill:  0    Do not place this medication, or any other prescription from our practice, on "Automatic Refill". Patient may have prescription filled one day early if pharmacy is closed on scheduled refill date. Do not fill until: 05/06/2017 To last until:06/05/2017    Order Specific Question:   Supervising Provider    Answer:   Milinda Pointer [phone]  . Magnesium Oxide 500 MG CAPS    Sig: Take 1 capsule (500  mg total) by mouth daily.    Dispense:  90 capsule    Refill:  0    Do not place this medication, or any other prescription from our practice, on "Automatic Refill". Patient may have prescription filled one day early if pharmacy is closed on scheduled refill date.    Order Specific Question:   Supervising Provider    Answer:   Milinda Pointer [phone]  . Cholecalciferol (CVS VITAMIN D) 2000 units CAPS    Sig: Take 1 capsule (2,000 Units total) by mouth daily.    Dispense:  30 each  Refill:  6    Order Specific Question:   Supervising Provider    Answer:   Milinda Pointer [phone]  This SmartLink is deprecated. Use AVSMEDLIST instead to display the medication list for a patient. Medications administered today: Jaiana R. Boehme had no medications administered during this visit. Lab-work, procedure(s), and/or referral(s): Orders Placed This Encounter  Procedures  . ToxASSURE Select 13 (MW), Urine   Imaging and/or referral(s): None  Interventional therapies: Planned, scheduled, and/or pending:  Not at this time.   Considering:  None at this time. The patient's body habitus makes any interventional therapy technically challenging and decreases the chances of success. The patient has been informed that once her BMI goes down low 35, we will revisit and offer her other alternatives.   Palliative PRN treatment(s):  None at this time. The patient's body habitus makes any interventional therapy technically challenging and decreases the chances of success. The patient has been informed that once her BMI goes down low 35, we will revisit and offer her other alternatives.   Provider-requested follow-up: Return in about 3 months (around 07/26/2017) for MedMgmt with Me Donella Stade [facility] Pace).  No future appointments. Primary Care Physician: Lajean Manes, MD Location: [facility] Note by: Vevelyn Francois NP Date: 04/27/2017; Time: 11:35 AM  Pain Score  Disclaimer: We use the NRS-11 scale. This is a self-reported, subjective measurement of pain severity with only modest accuracy. It is used primarily to identify changes within a particular patient. It must be understood that outpatient pain scales are significantly less accurate that those used for research, where they can be applied under ideal controlled circumstances with minimal exposure to variables. In reality, the score is likely to be a combination of pain intensity and pain affect, where pain affect describes the degree of emotional arousal or changes in action readiness caused by the sensory experience of pain. Factors such as social and work situation, setting, emotional state, anxiety levels, expectation, and prior pain experience may influence pain perception and show large inter-individual differences that may also be affected by time variables.  Patient instructions provided during this appointment: Patient Instructions   ____________________________________________________________________________________________  Medication Rules  Applies to: All patients receiving prescriptions (written or electronic).  Pharmacy of record: Pharmacy where electronic prescriptions will be sent. If written prescriptions are taken to a different pharmacy, please inform the nursing staff. The pharmacy listed in the electronic medical record should be the one where you would like electronic prescriptions to be sent.  Prescription refills: Only during scheduled appointments. Applies to both, written and electronic prescriptions.  NOTE: The following applies primarily to controlled substances (Opioid* Pain Medications).   Patient's responsibilities: 1. Pain Pills: Bring all pain pills to every appointment (except for procedure appointments). 2. Pill Bottles: Bring pills in original pharmacy bottle. Always bring newest bottle. Bring bottle, even if empty. 3. Medication refills: You are responsible for  knowing and keeping track of what medications you need refilled. The day before your appointment, write a list of all prescriptions that need to be refilled. Bring that list to your appointment and give it to the admitting nurse. Prescriptions will be written only during appointments. If you forget a medication, it will not be "Called in", "Faxed", or "electronically sent". You will need to get another appointment to get these prescribed. 4. Prescription Accuracy: You are responsible for carefully inspecting your prescriptions before leaving our office. Have the discharge nurse carefully go over each prescription with you, before taking them  home. Make sure that your name is accurately spelled, that your address is correct. Check the name and dose of your medication to make sure it is accurate. Check the number of pills, and the written instructions to make sure they are clear and accurate. Make sure that you are given enough medication to last until your next medication refill appointment. 5. Taking Medication: Take medication as prescribed. Never take more pills than instructed. Never take medication more frequently than prescribed. Taking less pills or less frequently is permitted and encouraged, when it comes to controlled substances (written prescriptions).  6. Inform other Doctors: Always inform, all of your healthcare providers, of all the medications you take. 7. Pain Medication from other Providers: You are not allowed to accept any additional pain medication from any other Doctor or Healthcare provider. There are two exceptions to this rule. (see below) In the event that you require additional pain medication, you are responsible for notifying us, as stated below. 8. Medication Agreement: You are responsible for carefully reading and following our Medication Agreement. This must be signed before receiving any prescriptions from our practice. Safely store a copy of your signed Agreement. Violations to  the Agreement will result in no further prescriptions. (Additional copies of our Medication Agreement are available upon request.) 9. Laws, Rules, & Regulations: All patients are expected to follow all Federal and [employer], [employer], Rules, [employer]. Ignorance of the Laws does not constitute a valid excuse. The use of any illegal substances is prohibited. 10. Adopted CDC guidelines & recommendations: Target dosing levels will be at or below 60 MME/day. Use of benzodiazepines** is not recommended.  Exceptions: There are only two exceptions to the rule of not receiving pain medications from other Healthcare Providers. 1. Exception #1 (Emergencies): In the event of an emergency (i.e.: accident requiring emergency care), you are allowed to receive additional pain medication. However, you are responsible for: As soon as you are able, call our office [phone], at any time of the day or night, and leave a message stating your name, the date and nature of the emergency, and the name and dose of the medication prescribed. In the event that your call is answered by a member of our staff, make sure to document and save the date, time, and the name of the person that took your information.  2. Exception #2 (Planned Surgery): In the event that you are scheduled by another doctor or dentist to have any type of surgery or procedure, you are allowed (for a period no longer than 30 days), to receive additional pain medication, for the acute post-op pain. However, in this case, you are responsible for picking up a copy of our "Post-op Pain Management for Surgeons" handout, and giving it to your surgeon or dentist. This document is available at our office, and does not require an appointment to obtain it. Simply go to our office during business hours (Monday-Thursday from 8:00 AM to 4:00 PM) (Friday 8:00 AM to 12:00 Noon) or if you have a scheduled appointment with [place], prior to your surgery, and ask for it by name.  In addition, you will need to provide [place] with your name, name of your surgeon, type of surgery, and date of procedure or surgery.  *Opioid medications include: morphine, codeine, oxycodone, oxymorphone, hydrocodone, hydromorphone, meperidine, tramadol, tapentadol, buprenorphine, fentanyl, methadone. **Benzodiazepine medications include: diazepam (Valium), alprazolam (Xanax), clonazepam (Klonopine), lorazepam (Ativan), clorazepate (Tranxene), chlordiazepoxide (Librium), estazolam (Prosom), oxazepam (Serax), temazepam (Restoril), triazolam (Halcion)  ____________________________________________________________________________________________

## 2017-05-05 DIAGNOSIS — E038 Other specified hypothyroidism: Secondary | ICD-10-CM | POA: Diagnosis not present

## 2017-05-05 DIAGNOSIS — E063 Autoimmune thyroiditis: Secondary | ICD-10-CM | POA: Diagnosis not present

## 2017-05-05 LAB — DRUG SCREEN 10 W/CONF, SERUM
Amphetamines, IA: NEGATIVE ng/mL
Barbiturates, IA: NEGATIVE ug/mL
Benzodiazepines, IA: NEGATIVE ng/mL
Cocaine & Metabolite, IA: NEGATIVE ng/mL
Methadone, IA: NEGATIVE ng/mL
OPIATES, IA: POSITIVE ng/mL — AB
Oxycodones, IA: NEGATIVE ng/mL
PHENCYCLIDINE, IA: NEGATIVE ng/mL
PROPOXYPHENE, IA: NEGATIVE ng/mL
THC(Marijuana) Metabolite, IA: NEGATIVE ng/mL

## 2017-05-05 LAB — OPIATES,MS,WB/SP RFX
6-ACETYLMORPHINE: NEGATIVE
CODEINE: NEGATIVE ng/mL
Dihydrocodeine: 2.9 ng/mL
HYDROMORPHONE: NEGATIVE ng/mL
Hydrocodone: 12.1 ng/mL
Morphine: NEGATIVE ng/mL
OPIATE CONFIRMATION: POSITIVE

## 2017-05-05 LAB — OXYCODONES,MS,WB/SP RFX
Oxycocone: NEGATIVE ng/mL
Oxycodones Confirmation: NEGATIVE
Oxymorphone: NEGATIVE ng/mL

## 2017-05-06 DIAGNOSIS — I10 Essential (primary) hypertension: Secondary | ICD-10-CM | POA: Diagnosis not present

## 2017-05-06 DIAGNOSIS — L281 Prurigo nodularis: Secondary | ICD-10-CM | POA: Diagnosis not present

## 2017-05-06 DIAGNOSIS — Z23 Encounter for immunization: Secondary | ICD-10-CM | POA: Diagnosis not present

## 2017-05-06 DIAGNOSIS — L719 Rosacea, unspecified: Secondary | ICD-10-CM | POA: Diagnosis not present

## 2017-05-06 DIAGNOSIS — Z79899 Other long term (current) drug therapy: Secondary | ICD-10-CM | POA: Diagnosis not present

## 2017-05-08 DIAGNOSIS — H1855 Macular corneal dystrophy: Secondary | ICD-10-CM | POA: Diagnosis not present

## 2017-05-08 DIAGNOSIS — Z947 Corneal transplant status: Secondary | ICD-10-CM | POA: Diagnosis not present

## 2017-07-23 ENCOUNTER — Encounter: Payer: Self-pay | Admitting: Nurse Practitioner

## 2017-07-23 ENCOUNTER — Ambulatory Visit: Payer: 59 | Attending: Nurse Practitioner | Admitting: Nurse Practitioner

## 2017-07-23 ENCOUNTER — Other Ambulatory Visit: Payer: Self-pay

## 2017-07-23 VITALS — BP 150/84 | HR 96 | Temp 98.4°F | Resp 18 | Ht 63.0 in | Wt 295.0 lb

## 2017-07-23 DIAGNOSIS — E039 Hypothyroidism, unspecified: Secondary | ICD-10-CM | POA: Diagnosis not present

## 2017-07-23 DIAGNOSIS — E559 Vitamin D deficiency, unspecified: Secondary | ICD-10-CM | POA: Diagnosis not present

## 2017-07-23 DIAGNOSIS — Z79899 Other long term (current) drug therapy: Secondary | ICD-10-CM | POA: Insufficient documentation

## 2017-07-23 DIAGNOSIS — Z9889 Other specified postprocedural states: Secondary | ICD-10-CM | POA: Diagnosis not present

## 2017-07-23 DIAGNOSIS — M7918 Myalgia, other site: Secondary | ICD-10-CM | POA: Insufficient documentation

## 2017-07-23 DIAGNOSIS — Z91013 Allergy to seafood: Secondary | ICD-10-CM | POA: Insufficient documentation

## 2017-07-23 DIAGNOSIS — Z87442 Personal history of urinary calculi: Secondary | ICD-10-CM | POA: Diagnosis not present

## 2017-07-23 DIAGNOSIS — M47816 Spondylosis without myelopathy or radiculopathy, lumbar region: Secondary | ICD-10-CM | POA: Insufficient documentation

## 2017-07-23 DIAGNOSIS — M25561 Pain in right knee: Secondary | ICD-10-CM

## 2017-07-23 DIAGNOSIS — T402X5A Adverse effect of other opioids, initial encounter: Secondary | ICD-10-CM | POA: Diagnosis not present

## 2017-07-23 DIAGNOSIS — Z79891 Long term (current) use of opiate analgesic: Secondary | ICD-10-CM | POA: Insufficient documentation

## 2017-07-23 DIAGNOSIS — G894 Chronic pain syndrome: Secondary | ICD-10-CM | POA: Insufficient documentation

## 2017-07-23 DIAGNOSIS — Z947 Corneal transplant status: Secondary | ICD-10-CM | POA: Diagnosis not present

## 2017-07-23 DIAGNOSIS — N200 Calculus of kidney: Secondary | ICD-10-CM | POA: Insufficient documentation

## 2017-07-23 DIAGNOSIS — K5903 Drug induced constipation: Secondary | ICD-10-CM

## 2017-07-23 DIAGNOSIS — G8929 Other chronic pain: Secondary | ICD-10-CM | POA: Diagnosis not present

## 2017-07-23 DIAGNOSIS — I10 Essential (primary) hypertension: Secondary | ICD-10-CM | POA: Diagnosis not present

## 2017-07-23 DIAGNOSIS — Z791 Long term (current) use of non-steroidal anti-inflammatories (NSAID): Secondary | ICD-10-CM | POA: Insufficient documentation

## 2017-07-23 DIAGNOSIS — M170 Bilateral primary osteoarthritis of knee: Secondary | ICD-10-CM | POA: Insufficient documentation

## 2017-07-23 DIAGNOSIS — Z8249 Family history of ischemic heart disease and other diseases of the circulatory system: Secondary | ICD-10-CM | POA: Diagnosis not present

## 2017-07-23 DIAGNOSIS — M25562 Pain in left knee: Secondary | ICD-10-CM

## 2017-07-23 DIAGNOSIS — Z880 Allergy status to penicillin: Secondary | ICD-10-CM | POA: Diagnosis not present

## 2017-07-23 MED ORDER — HYDROCODONE-ACETAMINOPHEN 10-325 MG PO TABS: 1.0000 | ORAL_TABLET | Freq: Four times a day (QID) | ORAL | 0 refills | Status: DC | PRN

## 2017-07-23 MED ORDER — BENEFIBER PO POWD: ORAL | 99 refills | Status: DC

## 2017-07-23 MED ORDER — MAGNESIUM OXIDE -MG SUPPLEMENT 500 MG PO CAPS: 1.0000 | ORAL_CAPSULE | Freq: Every day | ORAL | 0 refills | Status: DC

## 2017-07-23 MED ORDER — CHOLECALCIFEROL 50 MCG (2000 UT) PO CAPS: 2000.0000 [IU] | ORAL_CAPSULE | Freq: Every day | ORAL | 6 refills | Status: DC

## 2017-07-23 NOTE — Progress Notes (Signed)
Nursing Pain Medication Assessment:  Safety precautions to be maintained throughout the outpatient stay will include: orient to surroundings, keep bed in low position, maintain call bell within reach at all times, provide assistance with transfer out of bed and ambulation.  Medication Inspection Compliance: Pill count conducted under aseptic conditions, in front of the patient. Neither the pills nor the bottle was removed from the patient's sight at any time. Once count was completed pills were immediately returned to the patient in their original bottle.  Medication: Hydrocodone/APAP Pill/Patch Count: 56 of 120 pills remain Pill/Patch Appearance: Markings consistent with prescribed medication Bottle Appearance: Standard pharmacy container. Clearly labeled. Filled Date: 03 / 17 / 2019 Last Medication intake:  Today

## 2017-07-23 NOTE — Progress Notes (Signed)
Patient's Name: Alison Cox  MRN: [id number]  Referring Provider: Lajean Manes, MD  DOB: Aug 07, 1955  PCP: Lajean Manes, MD  DOS: 07/23/2017  Note by: Vevelyn Francois NP  Service setting: Ambulatory outpatient  Specialty: Interventional Pain Management  Location: [facility] (AMB) Pain Management Facility    Patient type: Established    Primary Reason(s) for Visit: Encounter for prescription drug management. (Level of risk: moderate)  CC: Knee Pain (bilateral)  HPI  Alison Cox is a 62 y.o. year old, female patient, who comes today for a medication management evaluation. 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; Long term current use of opiate analgesic; Long term prescription opiate use; Opiate use (40 MME/Day); Encounter for therapeutic drug level monitoring; Opioid dependence ([place]); Lumbar spondylosis; Grade 1 Anterolisthesis of L4 over L5; Therapeutic opioid-induced constipation (OIC); Chronic knee pain (Location of Primary Source of Pain) (Bilateral) (R>L); Osteoarthritis of knees (Location of Primary Source of Pain) (Bilateral) (R>L); Vitamin D insufficiency; History of Bell's palsy (left-sided); History of nephrolithiasis; History of Hashimoto thyroiditis; Osteoarthrosis; Obesity, Class III, BMI 40-49.9 (morbid obesity) (HCC) (254% higher incidence of chronic low back pain); Spondylolysis of lumbosacral region (Advanced disc degeneration and spondylosis L5-S1); Osteoarthritis of ankles (Location of Secondary source of pain) (Bilateral) (L>R); Chronic low back pain (Location of Tertiary source of pain) (midline); Chronic hip pain (Left); Chronic pain syndrome; Musculoskeletal pain; Chronic ankle pain (Location of Secondary source of pain) (Bilateral) (L>R); Chronic autoimmune thyroiditis; Hypothyroidism due to Hashimoto's thyroiditis; and Nontoxic thyroid nodule on their problem list. Her primarily concern today is the Knee Pain  (bilateral)  Pain Assessment: Location: Right, Left Knee Onset: More than a month ago Quality: Aching, Sharp Severity: 2 /10 (self-reported pain score)  Note: Reported level is compatible with observation.                          Timing: Intermittent Modifying factors: rest, medications,  Alison Cox was last scheduled for an appointment on 04/27/2017 for medication management. During today's appointment we reviewed Alison Cox's chronic pain status, as well as her outpatient medication regimen. She admits that her pain is stable. She denies any concerns today.   The patient  reports that she does not use drugs. Her body mass index is 52.26 kg/m.  Further details on both, my assessment(s), as well as the proposed treatment plan, please see below.  Controlled Substance Pharmacotherapy Assessment REMS (Risk Evaluation and Mitigation Strategy)  Analgesic:Hydrocodone/APAP 10/325 one tablet by mouth every 6 hours (total: 40 mg/day) MME/day:40 mg/day   Hart Rochester, RN  07/23/2017  9:27 AM  Sign at close encounter Nursing Pain Medication Assessment:  Safety precautions to be maintained throughout the outpatient stay will include: orient to surroundings, keep bed in low position, maintain call bell within reach at all times, provide assistance with transfer out of bed and ambulation.  Medication Inspection Compliance: Pill count conducted under aseptic conditions, in front of the patient. Neither the pills nor the bottle was removed from the patient's sight at any time. Once count was completed pills were immediately returned to the patient in their original bottle.  Medication: Hydrocodone/APAP Pill/Patch Count: 56 of 120 pills remain Pill/Patch Appearance: Markings consistent with prescribed medication Bottle Appearance: Standard pharmacy container. Clearly labeled. Filled Date: 03 / 17 / 2019 Last Medication intake:  Today   Pharmacokinetics: Liberation and absorption (onset of  action): WNL Distribution (time to  peak effect): WNL Metabolism and excretion (duration of action): WNL         Pharmacodynamics: Desired effects: Analgesia: Alison Cox reports >50% benefit. Functional ability: Patient reports that medication allows her to accomplish basic ADLs Clinically meaningful improvement in function (CMIF): Sustained CMIF goals met Perceived effectiveness: Described as relatively effective, allowing for increase in activities of daily living (ADL) Undesirable effects: Side-effects or Adverse reactions: None reported Monitoring: Secor PMP: Online review of the past 46-monthperiod conducted. Compliant with practice rules and regulations Last UDS on record: Summary  Date Value Ref Range Status  10/14/2016 FINAL  Final    Comment:    ==================================================================== TOXASSURE SELECT 13 (MW) ==================================================================== Test                             Result       Flag       Units Drug Present and Declared for Prescription Verification   Hydrocodone                    1874         EXPECTED   ng/mg creat   Hydromorphone                  503          EXPECTED   ng/mg creat   Dihydrocodeine                 461          EXPECTED   ng/mg creat   Norhydrocodone                 1191         EXPECTED   ng/mg creat    Sources of hydrocodone include scheduled prescription    medications. Hydromorphone, dihydrocodeine and norhydrocodone are    expected metabolites of hydrocodone. Hydromorphone and    dihydrocodeine are also available as scheduled prescription    medications. ==================================================================== Test                      Result    Flag   Units      Ref Range   Creatinine              185              mg/dL      >=20 ==================================================================== Declared Medications:  The flagging and interpretation on this report  are based on the  following declared medications.  Unexpected results may arise from  inaccuracies in the declared medications.  **Note: The testing scope of this panel includes these medications:  Hydrocodone (Hydrocodone-Acetaminophen)  **Note: The testing scope of this panel does not include following  reported medications:  Acetaminophen (Hydrocodone-Acetaminophen)  Amlodipine Besylate  Calcium Carbonate  Cholecalciferol  Doxycycline  Eye Drop  Hydrochlorothiazide  Levothyroxine  Magnesium Oxide  Meloxicam  Potassium  Prednisolone ==================================================================== For clinical consultation, please call ([phone] ====================================================================    UDS interpretation: Compliant          Medication Assessment Form: Reviewed. Patient indicates being compliant with therapy Treatment compliance: Compliant Risk Assessment Profile: Aberrant behavior: See prior evaluations. None observed or detected today Comorbid factors increasing risk of overdose: See prior notes. No additional risks detected today Risk of substance use disorder (SUD): Low Opioid Risk Tool - 07/23/17 0924      Family History of Substance Abuse  Alcohol  Negative    Illegal Drugs  Negative    Rx Drugs  Negative      Personal History of Substance Abuse   Alcohol  Negative    Illegal Drugs  Negative    Rx Drugs  Negative      Age   Age between 65-45 years   No      History of Preadolescent Sexual Abuse   History of Preadolescent Sexual Abuse  Negative or Female      Psychological Disease   Psychological Disease  Negative    Depression  Negative      Total Score   Opioid Risk Tool Scoring  0    Opioid Risk Interpretation  Low Risk      ORT Scoring interpretation table:  Score <3 = Low Risk for SUD  Score between 4-7 = Moderate Risk for SUD  Score >8 = High Risk for Opioid Abuse   Risk Mitigation Strategies:  Patient  Counseling: Covered Patient-Prescriber Agreement (PPA): Present and active  Notification to other healthcare providers: Done  Pharmacologic Plan: No change in therapy, at this time.             Laboratory Chemistry  Inflammation Markers (CRP: Acute Phase) (ESR: Chronic Phase) Lab Results  Component Value Date   CRP <0.8 05/01/2016   ESRSEDRATE 8 05/01/2016                         Rheumatology Markers No results found for: Elayne Guerin, [facility]                      Renal Function Markers Lab Results  Component Value Date   BUN 18 05/01/2016   CREATININE 0.82 05/01/2016   GFRAA >60 05/01/2016   GFRNONAA >60 05/01/2016                              Hepatic Function Markers Lab Results  Component Value Date   AST 37 05/01/2016   ALT 47 05/01/2016   ALBUMIN 4.6 05/01/2016   ALKPHOS 93 05/01/2016                        Electrolytes Lab Results  Component Value Date   NA 141 05/01/2016   K 4.0 05/01/2016   CL 106 05/01/2016   CALCIUM 10.0 05/01/2016   MG 2.0 05/01/2016                        Neuropathy Markers Lab Results  Component Value Date   VITAMINB12 302 05/01/2016                        Bone Pathology Markers Lab Results  Component Value Date   25OHVITD1 32 05/01/2016   25OHVITD2 2.2 05/01/2016   25OHVITD3 30 05/01/2016                         Coagulation Parameters No results found for: INR, LABPROT, APTT, PLT, DDIMER                      Cardiovascular Markers Lab Results  Component Value Date   HGB 15.6 (H) 10/11/2011   HCT 46.0 10/11/2011  CA Markers No results found for: CEA, CA125, LABCA2                      Note: Lab results reviewed.  Recent Diagnostic Imaging Results  US THYROID CLINICAL DATA:  Follow-up thyroid nodules.  EXAM: THYROID ULTRASOUND  TECHNIQUE: Ultrasound examination of the thyroid gland and adjacent soft tissues was performed.  COMPARISON:  Thyroid  ultrasound - 09/27/2013 ; 10/04/2012; 02/26/2012; ultrasound-guided right-sided thyroid nodule biopsy - 03/30/2012  FINDINGS: Grossly unchanged diffuse heterogeneity of the thyroid parenchymal echotexture and. No definitive new or enlarging thyroid nodules.  Right thyroid lobe  Measurements: Slightly diminutive in size measuring 3.2 x 1.4 x 2.0 cm, previously, 5.1 x 1.9 x 1.7 cm.  Right, mid, anterior - 1.2 x 1.0 x 2.0 cm - mixed echogenic, solid - grossly unchanged since the 02/2012 examination, previously, 2.0 x 0.9 x 1.4 cm. This nodule was previously biopsied.  Left thyroid lobe  Measurements: Slightly diminutive in size measuring 3.3 x 1.6 x 1.8 cm, unchanged, previously, 5.4 x 2.0 x 1.4 cm.  Left, superior - 0.5 cm - echogenic dystrophic calcification with posterior acoustic shadowing - unchanged since the 02/2012 examination  Isthmus  Thickness: Normal in size measuring 0.6 cm in diameter, unchanged.  There is mild diffuse heterogeneity of the thyroid isthmus without discrete nodule or mass.  Lymphadenopathy  None visualized.  IMPRESSION: 1. No new or enlarging thyroid nodules with all discretely measured thyroid nodules including previously biopsied dominant approximately 2 cm nodule within the right lobe of thyroid appearing unchanged since most remote examination performed 02/2012. 2. Nonspecific apparent mild interval atrophy of both the right and left lobes of the thyroid with the right lobe now measuring 3.2 cm in maximal diameter (previously, 5.1 cm) and the left lobe measuring 3.3 cm (previously, 5.4 cm). Correlation with thyroid function tests is recommended.  Electronically Signed   By: Sandi Mariscal M.D.   On: 10/15/2015 15:26  Complexity Note: Imaging results reviewed. Results shared with Alison Cox, using Layman's terms.                         Meds   Current Outpatient Medications:  .  amLODipine (NORVASC) 5 MG tablet, Take 5 mg by mouth  daily., Disp: , Rfl:  .  calcium carbonate (CALCIUM 600) 1500 (600 Ca) MG TABS tablet, Take by mouth 2 (two) times daily with a meal., Disp: , Rfl:  .  Cholecalciferol (CVS VITAMIN D) 2000 units CAPS, Take 1 capsule (2,000 Units total) by mouth daily., Disp: 30 each, Rfl: 6 .  CVS MAGNESIUM OXIDE 500 MG TABS, TAKE 1 CAPSULE (500 MG TOTAL) BY MOUTH DAILY., Disp: 90 tablet, Rfl: 0 .  doxycycline (DORYX) 100 MG DR capsule, Take 100 mg by mouth 2 (two) times daily., Disp: , Rfl:  .  hydrochlorothiazide (HYDRODIURIL) 25 MG tablet, Take 25 mg by mouth daily., Disp: , Rfl:  .  [START ON 09/03/2017] HYDROcodone-acetaminophen (NORCO) 10-325 MG tablet, Take 1 tablet by mouth every 6 (six) hours as needed for severe pain., Disp: 120 tablet, Rfl: 0 .  levothyroxine (SYNTHROID, LEVOTHROID) 150 MCG tablet, Take 150 mcg by mouth daily before breakfast., Disp: , Rfl:  .  Magnesium Oxide 500 MG CAPS, Take 1 capsule (500 mg total) by mouth daily., Disp: 90 capsule, Rfl: 0 .  meloxicam (MOBIC) 15 MG tablet, Take 15 mg by mouth daily., Disp: , Rfl:  .  potassium  chloride (KLOR-CON) 8 MEQ tablet, Take 8 mEq by mouth daily., Disp: , Rfl:  .  prednisoLONE acetate (PRED FORTE) 1 % ophthalmic suspension, Place 1 drop into the right eye every 2 (two) hours while awake., Disp: , Rfl:  .  Wheat Dextrin (BENEFIBER) POWD, Stir 2 teaspoons of Benefiber into 4-8 oz of any non-carbonated beverage or soft food (hot or cold) TID., Disp: 500 g, Rfl: PRN .  [START ON 10/03/2017] HYDROcodone-acetaminophen (NORCO) 10-325 MG tablet, Take 1 tablet by mouth every 6 (six) hours as needed for severe pain., Disp: 120 tablet, Rfl: 0 .  [START ON 08/04/2017] HYDROcodone-acetaminophen (NORCO) 10-325 MG tablet, Take 1 tablet by mouth every 6 (six) hours as needed for severe pain., Disp: 120 tablet, Rfl: 0  ROS  Constitutional: Denies any fever or chills Gastrointestinal: No reported hemesis, hematochezia, vomiting, or acute GI  distress Musculoskeletal: Denies any acute onset joint swelling, redness, loss of ROM, or weakness Neurological: No reported episodes of acute onset apraxia, aphasia, dysarthria, agnosia, amnesia, paralysis, loss of coordination, or loss of consciousness  Allergies  Alison Cox is allergic to penicillins; shellfish allergy; and sulfa antibiotics.  [place]  Drug: Alison Cox  reports that she does not use drugs. Alcohol:  reports that she does not drink alcohol. Tobacco:  reports that she has never smoked. She has never used smokeless tobacco. Medical:  has a past medical history of History of Bell's palsy (03/06/2015), History of Hashimoto thyroiditis (03/06/2015), History of nephrolithiasis (03/06/2015), Hypertension, Kidney stone, Macular corneal dystrophy (2005), Osteoarthritis, and Thyroid disease. Surgical: Alison Cox  has a past surgical history that includes Endometrial ablation; DILATATION AND CURETTAGE; Corneal transplant; and Corneal transplant. Family: family history includes Hypertension in her father and mother.  Constitutional Exam  General appearance: Well nourished, well developed, and well hydrated. In no apparent acute distress Vitals:   07/23/17 0917  BP: (!) 150/84  Pulse: 96  Resp: 18  Temp: 98.4 F (36.9 C)  TempSrc: Oral  SpO2: 98%  Weight: 295 lb (133.8 kg)  Height: '5\' 3"'  (1.6 m)  Psych/Mental status: Alert, oriented x 3 (person, place, & time)       Eyes: PERLA Respiratory: No evidence of acute respiratory distress  Cervical Spine Area Exam  Skin & Axial Inspection: No masses, redness, edema, swelling, or associated skin lesions Alignment: Symmetrical Functional ROM: Unrestricted ROM      Stability: No instability detected Muscle Tone/Strength: Functionally intact. No obvious neuro-muscular anomalies detected. Sensory (Neurological): Unimpaired Palpation: No palpable anomalies              Upper Extremity (UE) Exam    Side: Right upper extremity  Side: Left  upper extremity  Skin & Extremity Inspection: Skin color, temperature, and hair growth are WNL. No peripheral edema or cyanosis. No masses, redness, swelling, asymmetry, or associated skin lesions. No contractures.  Skin & Extremity Inspection: Skin color, temperature, and hair growth are WNL. No peripheral edema or cyanosis. No masses, redness, swelling, asymmetry, or associated skin lesions. No contractures.  Functional ROM: Unrestricted ROM          Functional ROM: Unrestricted ROM          Muscle Tone/Strength: Functionally intact. No obvious neuro-muscular anomalies detected.  Muscle Tone/Strength: Functionally intact. No obvious neuro-muscular anomalies detected.  Sensory (Neurological): Unimpaired          Sensory (Neurological): Unimpaired          Palpation: No palpable anomalies  Palpation: No palpable anomalies              Specialized Test(s): Deferred         Specialized Test(s): Deferred          Thoracic Spine Area Exam  Skin & Axial Inspection: No masses, redness, or swelling Alignment: Symmetrical Functional ROM: Unrestricted ROM Stability: No instability detected Muscle Tone/Strength: Functionally intact. No obvious neuro-muscular anomalies detected. Sensory (Neurological): Unimpaired Muscle strength & Tone: No palpable anomalies  Lumbar Spine Area Exam  Skin & Axial Inspection: No masses, redness, or swelling Alignment: Symmetrical Functional ROM: Unrestricted ROM      Stability: No instability detected Muscle Tone/Strength: Functionally intact. No obvious neuro-muscular anomalies detected. Sensory (Neurological): Unimpaired Palpation: No palpable anomalies       Provocative Tests: Lumbar Hyperextension and rotation test: evaluation deferred today       Lumbar Lateral bending test: evaluation deferred today       Patrick's Maneuver: evaluation deferred today                    Gait & Posture Assessment  Ambulation: Unassisted Gait: Relatively normal for  age and body habitus Posture: WNL   Lower Extremity Exam    Side: Right lower extremity  Side: Left lower extremity  Skin & Extremity Inspection: Skin color, temperature, and hair growth are WNL. No peripheral edema or cyanosis. No masses, redness, swelling, asymmetry, or associated skin lesions. No contractures.  Skin & Extremity Inspection: Skin color, temperature, and hair growth are WNL. No peripheral edema or cyanosis. No masses, redness, swelling, asymmetry, or associated skin lesions. No contractures.  Functional ROM: Unrestricted ROM          Functional ROM: Unrestricted ROM          Muscle Tone/Strength: Functionally intact. No obvious neuro-muscular anomalies detected.  Muscle Tone/Strength: Functionally intact. No obvious neuro-muscular anomalies detected.  Sensory (Neurological): Unimpaired  Sensory (Neurological): Unimpaired  Palpation: No palpable anomalies  Palpation: No palpable anomalies   Assessment  Primary Diagnosis & Pertinent Problem List: The primary encounter diagnosis was Lumbar spondylosis. Diagnoses of Chronic knee pain (Location of Primary Source of Pain) (Bilateral) (R>L), Musculoskeletal pain, Primary osteoarthritis of both knees, Chronic pain syndrome, Therapeutic opioid-induced constipation (OIC), Vitamin D insufficiency, and Long term current use of opiate analgesic were also pertinent to this visit.  Status Diagnosis  Controlled Controlled Controlled 1. Lumbar spondylosis   2. Chronic knee pain (Location of Primary Source of Pain) (Bilateral) (R>L)   3. Musculoskeletal pain   4. Primary osteoarthritis of both knees   5. Chronic pain syndrome   6. Therapeutic opioid-induced constipation (OIC)   7. Vitamin D insufficiency   8. Long term current use of opiate analgesic     Problems updated and reviewed during this visit: Problem  Chronic Autoimmune Thyroiditis  Hypothyroidism Due to Hashimoto's Thyroiditis  Nontoxic Thyroid Nodule   Plan of Care   Pharmacotherapy (Medications Ordered): Meds ordered this encounter  Medications  . HYDROcodone-acetaminophen (NORCO) 10-325 MG tablet    Sig: Take 1 tablet by mouth every 6 (six) hours as needed for severe pain.    Dispense:  120 tablet    Refill:  0    Do not place this medication, or any other prescription from our practice, on "Automatic Refill". Patient may have prescription filled one day early if pharmacy is closed on scheduled refill date. Do not fill until:10/03/2017 To last until:11/02/2017    Order Specific Question:  Supervising Provider    Answer:   Milinda Pointer [phone]  . HYDROcodone-acetaminophen (NORCO) 10-325 MG tablet    Sig: Take 1 tablet by mouth every 6 (six) hours as needed for severe pain.    Dispense:  120 tablet    Refill:  0    Do not place this medication, or any other prescription from our practice, on "Automatic Refill". Patient may have prescription filled one day early if pharmacy is closed on scheduled refill date. Do not fill until:09/03/2017 To last until: 10/03/2017    Order Specific Question:   Supervising Provider    Answer:   Milinda Pointer [phone]  . HYDROcodone-acetaminophen (NORCO) 10-325 MG tablet    Sig: Take 1 tablet by mouth every 6 (six) hours as needed for severe pain.    Dispense:  120 tablet    Refill:  0    Do not place this medication, or any other prescription from our practice, on "Automatic Refill". Patient may have prescription filled one day early if pharmacy is closed on scheduled refill date. Do not fill until: 08/04/2017 To last until:09/03/2017    Order Specific Question:   Supervising Provider    Answer:   Milinda Pointer [phone]  . Magnesium Oxide 500 MG CAPS    Sig: Take 1 capsule (500 mg total) by mouth daily.    Dispense:  90 capsule    Refill:  0    Do not place this medication, or any other prescription from our practice, on "Automatic Refill". Patient may have prescription filled one day early if  pharmacy is closed on scheduled refill date.    Order Specific Question:   Supervising Provider    Answer:   Milinda Pointer [phone]  . Wheat Dextrin (BENEFIBER) POWD    Sig: Stir 2 teaspoons of Benefiber into 4-8 oz of any non-carbonated beverage or soft food (hot or cold) TID.    Dispense:  500 g    Refill:  PRN    Do not place this medication, or any other prescription from our practice, on "Automatic Refill". Patient may have prescription filled one day early if pharmacy is closed on scheduled refill date.    Order Specific Question:   Supervising Provider    Answer:   Milinda Pointer [phone]  . Cholecalciferol (CVS VITAMIN D) 2000 units CAPS    Sig: Take 1 capsule (2,000 Units total) by mouth daily.    Dispense:  30 each    Refill:  6    Order Specific Question:   Supervising Provider    Answer:   Milinda Pointer [phone]   New Prescriptions   No medications on file   Medications administered today: Alison Cox had no medications administered during this visit. Lab-work, procedure(s), and/or referral(s): No orders of the defined types were placed in this encounter.  Imaging and/or referral(s): None  Interventional therapies: Planned, scheduled, and/or pending:  Not at this time.   Considering:  None at this time. The patient's body habitus makes any interventional therapy technically challenging and decreases the chances of success. The patient has been informed that once her BMI goes down low 35, we will revisit and offer her other alternatives.   Palliative PRN treatment(s):  None at this time. The patient's body habitus makes any interventional therapy technically challenging and decreases the chances of success. The patient has been informed that once her BMI goes down low 35, we will revisit and offer her other alternatives.    Provider-requested follow-up: Return  in about 3 months (around 10/22/2017) for MedMgmt with Me Donella Stade Edison Pace).  Future  Appointments  Date Time Provider [place]  10/21/2017  9:00 AM Vevelyn Francois, NP [facility] None   Primary Care Physician: Lajean Manes, MD Location: [facility] Note by: Vevelyn Francois NP Date: 07/23/2017; Time: 12:41 PM  Pain Score Disclaimer: We use the NRS-11 scale. This is a self-reported, subjective measurement of pain severity with only modest accuracy. It is used primarily to identify changes within a particular patient. It must be understood that outpatient pain scales are significantly less accurate that those used for research, where they can be applied under ideal controlled circumstances with minimal exposure to variables. In reality, the score is likely to be a combination of pain intensity and pain affect, where pain affect describes the degree of emotional arousal or changes in action readiness caused by the sensory experience of pain. Factors such as social and work situation, setting, emotional state, anxiety levels, expectation, and prior pain experience may influence pain perception and show large inter-individual differences that may also be affected by time variables.  Patient instructions provided during this appointment: Patient Instructions   _________________You have been given 2 scripts for Hydrocodone today and you have also got prescriptions that have been e scribed to your pharmacy.  ___________________________________________________________________________  Medication Rules  Applies to: All patients receiving prescriptions (written or electronic).  Pharmacy of record: Pharmacy where electronic prescriptions will be sent. If written prescriptions are taken to a different pharmacy, please inform the nursing staff. The pharmacy listed in the electronic medical record should be the one where you would like electronic prescriptions to be sent.  Prescription refills: Only during scheduled appointments. Applies to both, written and  electronic prescriptions.  NOTE: The following applies primarily to controlled substances (Opioid* Pain Medications).   Patient's responsibilities: 1. Pain Pills: Bring all pain pills to every appointment (except for procedure appointments). 2. Pill Bottles: Bring pills in original pharmacy bottle. Always bring newest bottle. Bring bottle, even if empty. 3. Medication refills: You are responsible for knowing and keeping track of what medications you need refilled. The day before your appointment, write a list of all prescriptions that need to be refilled. Bring that list to your appointment and give it to the admitting nurse. Prescriptions will be written only during appointments. If you forget a medication, it will not be "Called in", "Faxed", or "electronically sent". You will need to get another appointment to get these prescribed. 4. Prescription Accuracy: You are responsible for carefully inspecting your prescriptions before leaving our office. Have the discharge nurse carefully go over each prescription with you, before taking them home. Make sure that your name is accurately spelled, that your address is correct. Check the name and dose of your medication to make sure it is accurate. Check the number of pills, and the written instructions to make sure they are clear and accurate. Make sure that you are given enough medication to last until your next medication refill appointment. 5. Taking Medication: Take medication as prescribed. Never take more pills than instructed. Never take medication more frequently than prescribed. Taking less pills or less frequently is permitted and encouraged, when it comes to controlled substances (written prescriptions).  6. Inform other Doctors: Always inform, all of your healthcare providers, of all the medications you take. 7. Pain Medication from other Providers: You are not allowed to accept any additional pain medication from any other Doctor or Healthcare  provider.  There are two exceptions to this rule. (see below) In the event that you require additional pain medication, you are responsible for notifying us, as stated below. 8. Medication Agreement: You are responsible for carefully reading and following our Medication Agreement. This must be signed before receiving any prescriptions from our practice. Safely store a copy of your signed Agreement. Violations to the Agreement will result in no further prescriptions. (Additional copies of our Medication Agreement are available upon request.) 9. Laws, Rules, & Regulations: All patients are expected to follow all Federal and [employer], [employer], Rules, [employer]. Ignorance of the Laws does not constitute a valid excuse. The use of any illegal substances is prohibited. 10. Adopted CDC guidelines & recommendations: Target dosing levels will be at or below 60 MME/day. Use of benzodiazepines** is not recommended.  Exceptions: There are only two exceptions to the rule of not receiving pain medications from other Healthcare Providers. 1. Exception #1 (Emergencies): In the event of an emergency (i.e.: accident requiring emergency care), you are allowed to receive additional pain medication. However, you are responsible for: As soon as you are able, call our office [phone], at any time of the day or night, and leave a message stating your name, the date and nature of the emergency, and the name and dose of the medication prescribed. In the event that your call is answered by a member of our staff, make sure to document and save the date, time, and the name of the person that took your information.  2. Exception #2 (Planned Surgery): In the event that you are scheduled by another doctor or dentist to have any type of surgery or procedure, you are allowed (for a period no longer than 30 days), to receive additional pain medication, for the acute post-op pain. However, in this case, you are responsible for  picking up a copy of our "Post-op Pain Management for Surgeons" handout, and giving it to your surgeon or dentist. This document is available at our office, and does not require an appointment to obtain it. Simply go to our office during business hours (Monday-Thursday from 8:00 AM to 4:00 PM) (Friday 8:00 AM to 12:00 Noon) or if you have a scheduled appointment with [place], prior to your surgery, and ask for it by name. In addition, you will need to provide [place] with your name, name of your surgeon, type of surgery, and date of procedure or surgery.  *Opioid medications include: morphine, codeine, oxycodone, oxymorphone, hydrocodone, hydromorphone, meperidine, tramadol, tapentadol, buprenorphine, fentanyl, methadone. **Benzodiazepine medications include: diazepam (Valium), alprazolam (Xanax), clonazepam (Klonopine), lorazepam (Ativan), clorazepate (Tranxene), chlordiazepoxide (Librium), estazolam (Prosom), oxazepam (Serax), temazepam (Restoril), triazolam (Halcion) (Last updated: 06/18/2017) ____________________________________________________________________________________________    BMI Assessment: Estimated body mass index is 52.26 kg/m as calculated from the following:   Height as of this encounter: '5\' 3"'  (1.6 m).   Weight as of this encounter: 295 lb (133.8 kg).  BMI interpretation table: BMI level Category Range association with higher incidence of chronic pain  <18 kg/m2 Underweight   18.5-24.9 kg/m2 Ideal body weight   25-29.9 kg/m2 Overweight Increased incidence by 20%  30-34.9 kg/m2 Obese (Class I) Increased incidence by 68%  35-39.9 kg/m2 Severe obesity (Class II) Increased incidence by 136%  >40 kg/m2 Extreme obesity (Class III) Increased incidence by 254%   BMI Readings from Last 4 Encounters:  07/23/17 52.26 kg/m  04/27/17 53.14 kg/m  01/13/17 52.26 kg/m  10/14/16 52.26 kg/m   Wt Readings from Last 4 Encounters:  07/23/17 295 lb (133.8 kg)  04/27/17 300 lb (136.1 kg)   01/13/17 295 lb (133.8 kg)  10/14/16 295 lb (133.8 kg)

## 2017-07-23 NOTE — Patient Instructions (Addendum)
_________________You have been given 2 scripts for Hydrocodone today and you have also got prescriptions that have been e scribed to your pharmacy.  ___________________________________________________________________________  Medication Rules  Applies to: All patients receiving prescriptions (written or electronic).  Pharmacy of record: Pharmacy where electronic prescriptions will be sent. If written prescriptions are taken to a different pharmacy, please inform the nursing staff. The pharmacy listed in the electronic medical record should be the one where you would like electronic prescriptions to be sent.  Prescription refills: Only during scheduled appointments. Applies to both, written and electronic prescriptions.  NOTE: The following applies primarily to controlled substances (Opioid* Pain Medications).   Patient's responsibilities: 1. Pain Pills: Bring all pain pills to every appointment (except for procedure appointments). 2. Pill Bottles: Bring pills in original pharmacy bottle. Always bring newest bottle. Bring bottle, even if empty. 3. Medication refills: You are responsible for knowing and keeping track of what medications you need refilled. The day before your appointment, write a list of all prescriptions that need to be refilled. Bring that list to your appointment and give it to the admitting nurse. Prescriptions will be written only during appointments. If you forget a medication, it will not be "Called in", "Faxed", or "electronically sent". You will need to get another appointment to get these prescribed. 4. Prescription Accuracy: You are responsible for carefully inspecting your prescriptions before leaving our office. Have the discharge nurse carefully go over each prescription with you, before taking them home. Make sure that your name is accurately spelled, that your address is correct. Check the name and dose of your medication to make sure it is accurate. Check the number of  pills, and the written instructions to make sure they are clear and accurate. Make sure that you are given enough medication to last until your next medication refill appointment. 5. Taking Medication: Take medication as prescribed. Never take more pills than instructed. Never take medication more frequently than prescribed. Taking less pills or less frequently is permitted and encouraged, when it comes to controlled substances (written prescriptions).  6. Inform other Doctors: Always inform, all of your healthcare providers, of all the medications you take. 7. Pain Medication from other Providers: You are not allowed to accept any additional pain medication from any other Doctor or Healthcare provider. There are two exceptions to this rule. (see below) In the event that you require additional pain medication, you are responsible for notifying us, as stated below. 8. Medication Agreement: You are responsible for carefully reading and following our Medication Agreement. This must be signed before receiving any prescriptions from our practice. Safely store a copy of your signed Agreement. Violations to the Agreement will result in no further prescriptions. (Additional copies of our Medication Agreement are available upon request.) 9. Laws, Rules, & Regulations: All patients are expected to follow all Federal and [employer], [employer], Rules, [employer]. Ignorance of the Laws does not constitute a valid excuse. The use of any illegal substances is prohibited. 10. Adopted CDC guidelines & recommendations: Target dosing levels will be at or below 60 MME/day. Use of benzodiazepines** is not recommended.  Exceptions: There are only two exceptions to the rule of not receiving pain medications from other Healthcare Providers. 1. Exception #1 (Emergencies): In the event of an emergency (i.e.: accident requiring emergency care), you are allowed to receive additional pain medication. However, you are responsible for:  As soon as you are able, call our office [phone], at any time of the day  or night, and leave a message stating your name, the date and nature of the emergency, and the name and dose of the medication prescribed. In the event that your call is answered by a member of our staff, make sure to document and save the date, time, and the name of the person that took your information.  2. Exception #2 (Planned Surgery): In the event that you are scheduled by another doctor or dentist to have any type of surgery or procedure, you are allowed (for a period no longer than 30 days), to receive additional pain medication, for the acute post-op pain. However, in this case, you are responsible for picking up a copy of our "Post-op Pain Management for Surgeons" handout, and giving it to your surgeon or dentist. This document is available at our office, and does not require an appointment to obtain it. Simply go to our office during business hours (Monday-Thursday from 8:00 AM to 4:00 PM) (Friday 8:00 AM to 12:00 Noon) or if you have a scheduled appointment with [place], prior to your surgery, and ask for it by name. In addition, you will need to provide [place] with your name, name of your surgeon, type of surgery, and date of procedure or surgery.  *Opioid medications include: morphine, codeine, oxycodone, oxymorphone, hydrocodone, hydromorphone, meperidine, tramadol, tapentadol, buprenorphine, fentanyl, methadone. **Benzodiazepine medications include: diazepam (Valium), alprazolam (Xanax), clonazepam (Klonopine), lorazepam (Ativan), clorazepate (Tranxene), chlordiazepoxide (Librium), estazolam (Prosom), oxazepam (Serax), temazepam (Restoril), triazolam (Halcion) (Last updated: 06/18/2017) ____________________________________________________________________________________________    BMI Assessment: Estimated body mass index is 52.26 kg/m as calculated from the following:   Height as of this encounter: 5\' 3"  (1.6 m).    Weight as of this encounter: 295 lb (133.8 kg).  BMI interpretation table: BMI level Category Range association with higher incidence of chronic pain  <18 kg/m2 Underweight   18.5-24.9 kg/m2 Ideal body weight   25-29.9 kg/m2 Overweight Increased incidence by 20%  30-34.9 kg/m2 Obese (Class I) Increased incidence by 68%  35-39.9 kg/m2 Severe obesity (Class II) Increased incidence by 136%  >40 kg/m2 Extreme obesity (Class III) Increased incidence by 254%   BMI Readings from Last 4 Encounters:  07/23/17 52.26 kg/m  04/27/17 53.14 kg/m  01/13/17 52.26 kg/m  10/14/16 52.26 kg/m   Wt Readings from Last 4 Encounters:  07/23/17 295 lb (133.8 kg)  04/27/17 300 lb (136.1 kg)  01/13/17 295 lb (133.8 kg)  10/14/16 295 lb (133.8 kg)

## 2017-10-06 ENCOUNTER — Ambulatory Visit: Payer: Self-pay | Admitting: Podiatry

## 2017-10-06 DIAGNOSIS — M1711 Unilateral primary osteoarthritis, right knee: Secondary | ICD-10-CM | POA: Diagnosis not present

## 2017-10-06 DIAGNOSIS — M25569 Pain in unspecified knee: Secondary | ICD-10-CM | POA: Insufficient documentation

## 2017-10-06 DIAGNOSIS — M25562 Pain in left knee: Secondary | ICD-10-CM | POA: Diagnosis not present

## 2017-10-06 DIAGNOSIS — M25561 Pain in right knee: Secondary | ICD-10-CM | POA: Diagnosis not present

## 2017-10-21 ENCOUNTER — Ambulatory Visit: Payer: 59 | Attending: Nurse Practitioner | Admitting: Nurse Practitioner

## 2017-10-21 ENCOUNTER — Encounter: Payer: Self-pay | Admitting: Nurse Practitioner

## 2017-10-21 VITALS — BP 162/80 | HR 82 | Temp 98.2°F | Resp 16 | Ht 63.0 in | Wt 300.0 lb

## 2017-10-21 DIAGNOSIS — Z882 Allergy status to sulfonamides status: Secondary | ICD-10-CM | POA: Diagnosis not present

## 2017-10-21 DIAGNOSIS — M47816 Spondylosis without myelopathy or radiculopathy, lumbar region: Secondary | ICD-10-CM | POA: Diagnosis not present

## 2017-10-21 DIAGNOSIS — M545 Low back pain: Secondary | ICD-10-CM | POA: Diagnosis not present

## 2017-10-21 DIAGNOSIS — Z791 Long term (current) use of non-steroidal anti-inflammatories (NSAID): Secondary | ICD-10-CM | POA: Insufficient documentation

## 2017-10-21 DIAGNOSIS — M19071 Primary osteoarthritis, right ankle and foot: Secondary | ICD-10-CM | POA: Insufficient documentation

## 2017-10-21 DIAGNOSIS — E042 Nontoxic multinodular goiter: Secondary | ICD-10-CM | POA: Diagnosis not present

## 2017-10-21 DIAGNOSIS — E559 Vitamin D deficiency, unspecified: Secondary | ICD-10-CM | POA: Insufficient documentation

## 2017-10-21 DIAGNOSIS — Z87442 Personal history of urinary calculi: Secondary | ICD-10-CM | POA: Diagnosis not present

## 2017-10-21 DIAGNOSIS — M4307 Spondylolysis, lumbosacral region: Secondary | ICD-10-CM

## 2017-10-21 DIAGNOSIS — Z6841 Body Mass Index (BMI) 40.0 and over, adult: Secondary | ICD-10-CM | POA: Insufficient documentation

## 2017-10-21 DIAGNOSIS — Z79899 Other long term (current) drug therapy: Secondary | ICD-10-CM | POA: Insufficient documentation

## 2017-10-21 DIAGNOSIS — M19072 Primary osteoarthritis, left ankle and foot: Secondary | ICD-10-CM | POA: Insufficient documentation

## 2017-10-21 DIAGNOSIS — Z8249 Family history of ischemic heart disease and other diseases of the circulatory system: Secondary | ICD-10-CM | POA: Diagnosis not present

## 2017-10-21 DIAGNOSIS — E063 Autoimmune thyroiditis: Secondary | ICD-10-CM | POA: Insufficient documentation

## 2017-10-21 DIAGNOSIS — Z5181 Encounter for therapeutic drug level monitoring: Secondary | ICD-10-CM | POA: Insufficient documentation

## 2017-10-21 DIAGNOSIS — E0789 Other specified disorders of thyroid: Secondary | ICD-10-CM | POA: Diagnosis not present

## 2017-10-21 DIAGNOSIS — M170 Bilateral primary osteoarthritis of knee: Secondary | ICD-10-CM | POA: Insufficient documentation

## 2017-10-21 DIAGNOSIS — Z79890 Hormone replacement therapy (postmenopausal): Secondary | ICD-10-CM | POA: Insufficient documentation

## 2017-10-21 DIAGNOSIS — G510 Bell's palsy: Secondary | ICD-10-CM | POA: Diagnosis not present

## 2017-10-21 DIAGNOSIS — G894 Chronic pain syndrome: Secondary | ICD-10-CM | POA: Diagnosis not present

## 2017-10-21 DIAGNOSIS — I10 Essential (primary) hypertension: Secondary | ICD-10-CM | POA: Insufficient documentation

## 2017-10-21 DIAGNOSIS — M25552 Pain in left hip: Secondary | ICD-10-CM

## 2017-10-21 DIAGNOSIS — Z79891 Long term (current) use of opiate analgesic: Secondary | ICD-10-CM | POA: Diagnosis not present

## 2017-10-21 DIAGNOSIS — G8929 Other chronic pain: Secondary | ICD-10-CM

## 2017-10-21 DIAGNOSIS — Z880 Allergy status to penicillin: Secondary | ICD-10-CM | POA: Insufficient documentation

## 2017-10-21 DIAGNOSIS — M5137 Other intervertebral disc degeneration, lumbosacral region: Secondary | ICD-10-CM | POA: Diagnosis not present

## 2017-10-21 DIAGNOSIS — M47896 Other spondylosis, lumbar region: Secondary | ICD-10-CM | POA: Insufficient documentation

## 2017-10-21 DIAGNOSIS — M47817 Spondylosis without myelopathy or radiculopathy, lumbosacral region: Secondary | ICD-10-CM | POA: Insufficient documentation

## 2017-10-21 MED ORDER — HYDROCODONE-ACETAMINOPHEN 10-325 MG PO TABS: 1.0000 | ORAL_TABLET | Freq: Four times a day (QID) | ORAL | 0 refills | Status: DC | PRN

## 2017-10-21 NOTE — Progress Notes (Signed)
Patient's Name: Alison Cox  MRN: [id number]  Referring Provider: Lajean Manes, MD  DOB: [birth year]  PCP: Lajean Manes, MD  DOS: 10/21/2017  Note by: Vevelyn Francois NP  Service setting: Ambulatory outpatient  Specialty: Interventional Pain Management  Location: [facility] (AMB) Pain Management Facility    Patient type: Established    Primary Reason(s) for Visit: Encounter for prescription drug management. (Level of risk: moderate)  CC: Knee Pain (bilateral)  HPI  Alison Cox is a 62 y.o. year old, female patient, who comes today for a medication management evaluation. 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; Long term current use of opiate analgesic; Long term prescription opiate use; Opiate use (40 MME/Day); Encounter for therapeutic drug level monitoring; Opioid dependence ([name]); Lumbar spondylosis; Grade 1 Anterolisthesis of L4 over L5; Therapeutic opioid-induced constipation (OIC); Chronic knee pain (Location of Primary Source of Pain) (Bilateral) (R>L); Osteoarthritis of knees (Location of Primary Source of Pain) (Bilateral) (R>L); Vitamin D insufficiency; History of Bell's palsy (left-sided); History of nephrolithiasis; History of Hashimoto thyroiditis; Osteoarthrosis; Obesity, Class III, BMI 40-49.9 (morbid obesity) (HCC) (254% higher incidence of chronic low back pain); Spondylolysis of lumbosacral region (Advanced disc degeneration and spondylosis L5-S1); Osteoarthritis of ankles (Location of Secondary source of pain) (Bilateral) (L>R); Chronic low back pain (Location of Tertiary source of pain) (midline); Chronic hip pain (Left); Chronic pain syndrome; Musculoskeletal pain; Chronic ankle pain (Location of Secondary source of pain) (Bilateral) (L>R); Chronic autoimmune thyroiditis; Hypothyroidism due to Hashimoto's thyroiditis; Nontoxic thyroid nodule; and Knee pain on their problem list. Her primarily concern today is  the Knee Pain (bilateral)  Pain Assessment: Location: Left, Right Knee Radiating: up into the right hip Onset: More than a month ago Duration: Chronic pain Quality: Discomfort, Aching, Sharp Severity: 3 /10 (subjective, self-reported pain score)  Note: Reported level is compatible with observation.                          Effect on ADL: when sitting the knees are not in pain.  Timing: Intermittent Modifying factors: rest and medications BP: (!) 162/80  HR: 82  Alison Cox was last scheduled for an appointment on 07/23/2017 for medication management. During today's appointment we reviewed Alison Cox's chronic pain status, as well as her outpatient medication regimen. She denies any problems or concerns.   The patient  reports that she does not use drugs. Her body mass index is 53.14 kg/m.  Further details on both, my assessment(s), as well as the proposed treatment plan, please see below.  Controlled Substance Pharmacotherapy Assessment REMS (Risk Evaluation and Mitigation Strategy)  Analgesic:Hydrocodone/APAP 10/325 one tablet by mouth every 6 hours (total: 40 mg/day) MME/day:40 mg/day   Janett Billow, RN  10/21/2017  9:21 AM  Sign at close encounter Nursing Pain Medication Assessment:  Safety precautions to be maintained throughout the outpatient stay will include: orient to surroundings, keep bed in low position, maintain call bell within reach at all times, provide assistance with transfer out of bed and ambulation.  Medication Inspection Compliance: Pill count conducted under aseptic conditions, in front of the patient. Neither the pills nor the bottle was removed from the patient's sight at any time. Once count was completed pills were immediately returned to the patient in their original bottle.  Medication: Hydrocodone/APAP Pill/Patch Count: 54 of 120 pills remain Pill/Patch Appearance: Markings consistent with prescribed medication Bottle Appearance: Standard pharmacy  container. Clearly  labeled. Filled Date: 06 / 16 / 2019 Last Medication intake:  Today   Pharmacokinetics: Liberation and absorption (onset of action): WNL Distribution (time to peak effect): WNL Metabolism and excretion (duration of action): WNL         Pharmacodynamics: Desired effects: Analgesia: Alison Cox reports >50% benefit. Functional ability: Patient reports that medication allows her to accomplish basic ADLs Clinically meaningful improvement in function (CMIF): Sustained CMIF goals met Perceived effectiveness: Described as relatively effective, allowing for increase in activities of daily living (ADL) Undesirable effects: Side-effects or Adverse reactions: None reported Monitoring: [place] PMP: Online review of the past 33-monthperiod conducted. Compliant with practice rules and regulations Last UDS on record: Summary  Date Value Ref Range Status  10/14/2016 FINAL  Final    Comment:    ==================================================================== TOXASSURE SELECT 13 (MW) ==================================================================== Test                             Result       Flag       Units Drug Present and Declared for Prescription Verification   Hydrocodone                    1874         EXPECTED   ng/mg creat   Hydromorphone                  503          EXPECTED   ng/mg creat   Dihydrocodeine                 461          EXPECTED   ng/mg creat   Norhydrocodone                 1191         EXPECTED   ng/mg creat    Sources of hydrocodone include scheduled prescription    medications. Hydromorphone, dihydrocodeine and norhydrocodone are    expected metabolites of hydrocodone. Hydromorphone and    dihydrocodeine are also available as scheduled prescription    medications. ==================================================================== Test                      Result    Flag   Units      Ref Range   Creatinine              185              mg/dL       >=20 ==================================================================== Declared Medications:  The flagging and interpretation on this report are based on the  following declared medications.  Unexpected results may arise from  inaccuracies in the declared medications.  **Note: The testing scope of this panel includes these medications:  Hydrocodone (Hydrocodone-Acetaminophen)  **Note: The testing scope of this panel does not include following  reported medications:  Acetaminophen (Hydrocodone-Acetaminophen)  Amlodipine Besylate  Calcium Carbonate  Cholecalciferol  Doxycycline  Eye Drop  Hydrochlorothiazide  Levothyroxine  Magnesium Oxide  Meloxicam  Potassium  Prednisolone ==================================================================== For clinical consultation, please call [phone]====    UDS interpretation: Compliant          Medication Assessment Form: Reviewed. Patient indicates being compliant with therapy Treatment compliance: Compliant Risk Assessment Profile: Aberrant behavior: See prior evaluations. None observed or detected today Comorbid factors increasing risk of overdose: See prior notes. No additional  risks detected today Risk of substance use disorder (SUD): Low Opioid Risk Tool - 10/21/17 0931      Family History of Substance Abuse   Alcohol  Negative    Illegal Drugs  Negative    Rx Drugs  Negative      Personal History of Substance Abuse   Alcohol  Negative    Illegal Drugs  Negative    Rx Drugs  Negative      Psychological Disease   Psychological Disease  Negative    Depression  Negative      Total Score   Opioid Risk Tool Scoring  0    Opioid Risk Interpretation  Low Risk      ORT Scoring interpretation table:  Score <3 = Low Risk for SUD  Score between 4-7 = Moderate Risk for SUD  Score >8 = High Risk for Opioid Abuse   Risk Mitigation Strategies:  Patient  Counseling: Covered Patient-Prescriber Agreement (PPA): Present and active  Notification to other healthcare providers: Done  Pharmacologic Plan: No change in therapy, at this time.             Laboratory Chemistry  Inflammation Markers (CRP: Acute Phase) (ESR: Chronic Phase) Lab Results  Component Value Date   CRP <0.8 05/01/2016   ESRSEDRATE 8 05/01/2016                         Rheumatology Markers No results found for: RF, ANA, LABURIC, URICUR, LYMEIGGIGMAB, LYMEABIGMQN, HLAB27                      Renal Function Markers Lab Results  Component Value Date   BUN 18 05/01/2016   CREATININE 0.82 05/01/2016   GFRAA >60 05/01/2016   GFRNONAA >60 05/01/2016                             Hepatic Function Markers Lab Results  Component Value Date   AST 37 05/01/2016   ALT 47 05/01/2016   ALBUMIN 4.6 05/01/2016   ALKPHOS 93 05/01/2016                        Electrolytes Lab Results  Component Value Date   NA 141 05/01/2016   K 4.0 05/01/2016   CL 106 05/01/2016   CALCIUM 10.0 05/01/2016   MG 2.0 05/01/2016                        Neuropathy Markers Lab Results  Component Value Date   VITAMINB12 302 05/01/2016                        Bone Pathology Markers Lab Results  Component Value Date   25OHVITD1 32 05/01/2016   25OHVITD2 2.2 05/01/2016   25OHVITD3 30 05/01/2016                         Coagulation Parameters No results found for: INR, LABPROT, APTT, PLT, DDIMER                      Cardiovascular Markers Lab Results  Component Value Date   HGB 15.6 (H) 10/11/2011   HCT 46.0 10/11/2011  CA Markers No results found for: CEA, CA125, LABCA2                      Note: Lab results reviewed.  Recent Diagnostic Imaging Results  US THYROID CLINICAL DATA:  Follow-up thyroid nodules.  EXAM: THYROID ULTRASOUND  TECHNIQUE: Ultrasound examination of the thyroid gland and adjacent soft tissues was performed.  COMPARISON:  Thyroid  ultrasound - 09/27/2013 ; 10/04/2012; 02/26/2012; ultrasound-guided right-sided thyroid nodule biopsy - 03/30/2012  FINDINGS: Grossly unchanged diffuse heterogeneity of the thyroid parenchymal echotexture and. No definitive new or enlarging thyroid nodules.  Right thyroid lobe  Measurements: Slightly diminutive in size measuring 3.2 x 1.4 x 2.0 cm, previously, 5.1 x 1.9 x 1.7 cm.  Right, mid, anterior - 1.2 x 1.0 x 2.0 cm - mixed echogenic, solid - grossly unchanged since the 02/2012 examination, previously, 2.0 x 0.9 x 1.4 cm. This nodule was previously biopsied.  Left thyroid lobe  Measurements: Slightly diminutive in size measuring 3.3 x 1.6 x 1.8 cm, unchanged, previously, 5.4 x 2.0 x 1.4 cm.  Left, superior - 0.5 cm - echogenic dystrophic calcification with posterior acoustic shadowing - unchanged since the 02/2012 examination  Isthmus  Thickness: Normal in size measuring 0.6 cm in diameter, unchanged.  There is mild diffuse heterogeneity of the thyroid isthmus without discrete nodule or mass.  Lymphadenopathy  None visualized.  IMPRESSION: 1. No new or enlarging thyroid nodules with all discretely measured thyroid nodules including previously biopsied dominant approximately 2 cm nodule within the right lobe of thyroid appearing unchanged since most remote examination performed 02/2012. 2. Nonspecific apparent mild interval atrophy of both the right and left lobes of the thyroid with the right lobe now measuring 3.2 cm in maximal diameter (previously, 5.1 cm) and the left lobe measuring 3.3 cm (previously, 5.4 cm). Correlation with thyroid function tests is recommended.  Electronically Signed   By: Sandi Mariscal M.D.   On: 10/15/2015 15:26  Complexity Note: Imaging results reviewed. Results shared with Ms. [name], using Layman's terms.                         Meds   Current Outpatient Medications:  .  amLODipine (NORVASC) 5 MG tablet, Take 5 mg by mouth  daily., Disp: , Rfl:  .  calcium carbonate (CALCIUM 600) 1500 (600 Ca) MG TABS tablet, Take by mouth 2 (two) times daily with a meal., Disp: , Rfl:  .  Cholecalciferol ([employer]S VITAMIN D) 2000 units CAPS, Take 1 capsule (2,000 Units total) by mouth daily., Disp: 30 each, Rfl: 6 .  Ciclopirox 1 % shampoo, Apply 1 application topically as needed., Disp: , Rfl: 6 .  Crisaborole (EUCRISA) 2 % OINT, Apply 1 application topically as needed., Disp: , Rfl:  .  Difluprednate (DUREZOL) 0.05 % EMUL, Place 1 drop into the right eye 2 (two) times daily., Disp: , Rfl:  .  doxycycline (DORYX) 100 MG DR capsule, Take 100 mg by mouth 2 (two) times daily., Disp: , Rfl:  .  Fluocinolone Acetonide 0.01 % OIL, Place 1 drop into both ears as needed., Disp: , Rfl:  .  hydrochlorothiazide (HYDRODIURIL) 25 MG tablet, Take 25 mg by mouth daily., Disp: , Rfl:  .  [START ON 01/01/2018] HYDROcodone-acetaminophen (NORCO) 10-325 MG tablet, Take 1 tablet by mouth every 6 (six) hours as needed for severe pain., Disp: 120 tablet, Rfl: 0 .  Ivermectin (SOOLANTRA) 1 % CREA, Apply 1  application topically as needed., Disp: , Rfl:  .  levothyroxine (SYNTHROID, LEVOTHROID) 150 MCG tablet, Take 150 mcg by mouth daily before breakfast., Disp: , Rfl:  .  Magnesium Oxide 500 MG CAPS, Take 1 capsule (500 mg total) by mouth daily., Disp: 90 capsule, Rfl: 0 .  meloxicam (MOBIC) 15 MG tablet, Take 15 mg by mouth daily., Disp: , Rfl:  .  potassium chloride (KLOR-CON) 8 MEQ tablet, Take 8 mEq by mouth daily., Disp: , Rfl:  .  Wheat Dextrin (BENEFIBER) POWD, Stir 2 teaspoons of Benefiber into 4-8 oz of any non-carbonated beverage or soft food (hot or cold) TID., Disp: 500 g, Rfl: PRN .  CVS MAGNESIUM OXIDE 500 MG TABS, TAKE 1 CAPSULE (500 MG TOTAL) BY MOUTH DAILY., Disp: 90 tablet, Rfl: 0 .  [START ON 12/02/2017] HYDROcodone-acetaminophen (NORCO) 10-325 MG tablet, Take 1 tablet by mouth every 6 (six) hours as needed for severe pain., Disp: 120 tablet,  Rfl: 0 .  [START ON 11/02/2017] HYDROcodone-acetaminophen (NORCO) 10-325 MG tablet, Take 1 tablet by mouth every 6 (six) hours as needed for severe pain., Disp: 120 tablet, Rfl: 0 .  prednisoLONE acetate (PRED FORTE) 1 % ophthalmic suspension, Place 1 drop into the right eye every 2 (two) hours while awake., Disp: , Rfl:   ROS  Constitutional: Denies any fever or chills Gastrointestinal: No reported hemesis, hematochezia, vomiting, or acute GI distress Musculoskeletal: Denies any acute onset joint swelling, redness, loss of ROM, or weakness Neurological: No reported episodes of acute onset apraxia, aphasia, dysarthria, agnosia, amnesia, paralysis, loss of coordination, or loss of consciousness  Allergies  Ms. Markwell is allergic to penicillins; shellfish allergy; and sulfa antibiotics.  [place]  Drug: Ms. Olberding  reports that she does not use drugs. Alcohol:  reports that she does not drink alcohol. Tobacco:  reports that she has never smoked. She has never used smokeless tobacco. Medical:  has a past medical history of History of Bell's palsy (03/06/2015), History of Hashimoto thyroiditis (03/06/2015), History of nephrolithiasis (03/06/2015), Hypertension, Kidney stone, Macular corneal dystrophy (2005), Osteoarthritis, and Thyroid disease. Surgical: Ms. Skoda  has a past surgical history that includes Endometrial ablation; DILATATION AND CURETTAGE; Corneal transplant; and Corneal transplant. Family: family history includes Hypertension in her father and mother.  Constitutional Exam  General appearance: Well nourished, well developed, and well hydrated. In no apparent acute distress Vitals:   10/21/17 0918  BP: (!) 162/80  Pulse: 82  Resp: 16  Temp: 98.2 F (36.8 C)  TempSrc: Oral  SpO2: 97%  Weight: 300 lb (136.1 kg)  Height: '5\' 3"'  (1.6 m)  Psych/Mental status: Alert, oriented x 3 (person, place, & time)       Eyes: PERLA Respiratory: No evidence of acute respiratory distress  Lumbar  Spine Area Exam  Skin & Axial Inspection: No masses, redness, or swelling Alignment: Symmetrical Functional ROM: Unrestricted ROM       Stability: No instability detected Muscle Tone/Strength: Functionally intact. No obvious neuro-muscular anomalies detected. Sensory (Neurological): Unimpaired Palpation: No palpable anomalies       Provocative Tests: Lumbar Hyperextension/rotation test: deferred today       Lumbar quadrant test (Kemp's test): deferred today       Lumbar Lateral bending test: deferred today       Patrick's Maneuver: deferred today                   FABER test: deferred today       Thigh-thrust test: deferred today  S-I compression test: deferred today       S-I distraction test: deferred today        Gait & Posture Assessment  Ambulation: Unassisted Gait: Relatively normal for age and body habitus Posture: WNL   Lower Extremity Exam    Side: Right lower extremity  Side: Left lower extremity  Stability: No instability observed          Stability: No instability observed          Skin & Extremity Inspection: Skin color, temperature, and hair growth are WNL. No peripheral edema or cyanosis. No masses, redness, swelling, asymmetry, or associated skin lesions. No contractures.  Skin & Extremity Inspection: Skin color, temperature, and hair growth are WNL. No peripheral edema or cyanosis. No masses, redness, swelling, asymmetry, or associated skin lesions. No contractures.  Functional ROM: Unrestricted ROM                  Functional ROM: Unrestricted ROM                  Muscle Tone/Strength: Functionally intact. No obvious neuro-muscular anomalies detected.  Muscle Tone/Strength: Functionally intact. No obvious neuro-muscular anomalies detected.  Sensory (Neurological): Unimpaired  Sensory (Neurological): Unimpaired  Palpation: No palpable anomalies  Palpation: No palpable anomalies   Assessment  Primary Diagnosis & Pertinent Problem List: The primary encounter  diagnosis was Spondylolysis of lumbosacral region (Advanced disc degeneration and spondylosis L5-S1). Diagnoses of Lumbar spondylosis, Chronic hip pain (Left), and Chronic pain syndrome were also pertinent to this visit.  Status Diagnosis  Controlled Controlled Controlled 1. Spondylolysis of lumbosacral region (Advanced disc degeneration and spondylosis L5-S1)   2. Lumbar spondylosis   3. Chronic hip pain (Left)   4. Chronic pain syndrome     Problems updated and reviewed during this visit: Problem  Knee Pain   Plan of Care  Pharmacotherapy (Medications Ordered): Meds ordered this encounter  Medications  . HYDROcodone-acetaminophen (NORCO) 10-325 MG tablet    Sig: Take 1 tablet by mouth every 6 (six) hours as needed for severe pain.    Dispense:  120 tablet    Refill:  0    Do not place this medication, or any other prescription from our practice, on "Automatic Refill". Patient may have prescription filled one day early if pharmacy is closed on scheduled refill date. Do not fill until:01/01/2018 To last until:01/31/2018    Order Specific Question:   Supervising Provider    Answer:   Milinda Pointer [phone]  . HYDROcodone-acetaminophen (NORCO) 10-325 MG tablet    Sig: Take 1 tablet by mouth every 6 (six) hours as needed for severe pain.    Dispense:  120 tablet    Refill:  0    Do not place this medication, or any other prescription from our practice, on "Automatic Refill". Patient may have prescription filled one day early if pharmacy is closed on scheduled refill date. Do not fill until:12/02/2017 To last until:01/01/2018    Order Specific Question:   Supervising Provider    Answer:   Milinda Pointer [phone]  . HYDROcodone-acetaminophen (NORCO) 10-325 MG tablet    Sig: Take 1 tablet by mouth every 6 (six) hours as needed for severe pain.    Dispense:  120 tablet    Refill:  0    Do not place this medication, or any other prescription from our practice, on "Automatic  Refill". Patient may have prescription filled one day early if pharmacy is closed on scheduled refill date.  Do not fill until: 11/02/2017 To last until:12/02/2017    Order Specific Question:   Supervising Provider    Answer:   Milinda Pointer [phone]   New Prescriptions   No medications on file   Medications administered today: Nili R. Brunkow had no medications administered during this visit. Lab-work, procedure(s), and/or referral(s): No orders of the defined types were placed in this encounter.  Imaging and/or referral(s): None Interventional therapies: Planned, scheduled, and/or pending:  Not at this time.   Considering:  None at this time. The patient's body habitus makes any interventional therapy technically challenging and decreases the chances of success. The patient has been informed that once her BMI goes down low 35, we will revisit and offer her other alternatives.   Palliative PRN treatment(s):  None at this time. The patient's body habitus makes any interventional therapy technically challenging and decreases the chances of success. The patient has been informed that once her BMI goes down low 35, we will revisit and offer her other alternatives.   Provider-requested follow-up: Return in about 3 months (around 01/21/2018) for MedMgmt with Me Donella Stade Edison Pace).  No future appointments. Primary Care Physician: Lajean Manes, MD Location: [facility] Out[facility] Management Facility Note by: Vevelyn Francois NP Date: 10/21/2017; Time: 10:02 AM  Pain Score Disclaimer: We use the NRS-11 scale. This is a self-reported, subjective measurement of pain severity with only modest accuracy. It is used primarily to identify changes within a particular patient. It must be understood that outpatient pain scales are significantly less accurate that those used for research, where they can be applied under ideal controlled circumstances with minimal exposure to variables. In reality, the  score is likely to be a combination of pain intensity and pain affect, where pain affect describes the degree of emotional arousal or changes in action readiness caused by the sensory experience of pain. Factors such as social and work situation, setting, emotional state, anxiety levels, expectation, and prior pain experience may influence pain perception and show large inter-individual differences that may also be affected by time variables.  Patient instructions provided during this appointment: Patient Instructions   Hydrocodone - apap 10-325 x 3 months to begin filling on 11/02/17 given to patient today.   Medication Rules  Applies to: All patients receiving prescriptions (written or electronic).  Pharmacy of record: Pharmacy where electronic prescriptions will be sent. If written prescriptions are taken to a different pharmacy, please inform the nursing staff. The pharmacy listed in the electronic medical record should be the one where you would like electronic prescriptions to be sent.  Prescription refills: Only during scheduled appointments. Applies to both, written and electronic prescriptions.  NOTE: The following applies primarily to controlled substances (Opioid* Pain Medications).   Patient's responsibilities: 1. Pain Pills: Bring all pain pills to every appointment (except for procedure appointments). 2. Pill Bottles: Bring pills in original pharmacy bottle. Always bring newest bottle. Bring bottle, even if empty. 3. Medication refills: You are responsible for knowing and keeping track of what medications you need refilled. The day before your appointment, write a list of all prescriptions that need to be refilled. Bring that list to your appointment and give it to the admitting nurse. Prescriptions will be written only during appointments. If you forget a medication, it will not be "Called in", "Faxed", or "electronically sent". You will need to get another appointment to get these  prescribed. 4. Prescription Accuracy: You are responsible for carefully inspecting your prescriptions before leaving our office. Have  the discharge nurse carefully go over each prescription with you, before taking them home. Make sure that your name is accurately spelled, that your address is correct. Check the name and dose of your medication to make sure it is accurate. Check the number of pills, and the written instructions to make sure they are clear and accurate. Make sure that you are given enough medication to last until your next medication refill appointment. 5. Taking Medication: Take medication as prescribed. Never take more pills than instructed. Never take medication more frequently than prescribed. Taking less pills or less frequently is permitted and encouraged, when it comes to controlled substances (written prescriptions).  6. Inform other Doctors: Always inform, all of your healthcare providers, of all the medications you take. 7. Pain Medication from other Providers: You are not allowed to accept any additional pain medication from any other Doctor or Healthcare provider. There are two exceptions to this rule. (see below) In the event that you require additional pain medication, you are responsible for notifying us, as stated below. 8. Medication Agreement: You are responsible for carefully reading and following our Medication Agreement. This must be signed before receiving any prescriptions from our practice. Safely store a copy of your signed Agreement. Violations to the Agreement will result in no further prescriptions. (Additional copies of our Medication Agreement are available upon request.) 9. Laws, Rules, & Regulations: All patients are expected to follow all Federal and [employer], [employer], Rules, [employer]. Ignorance of the Laws does not constitute a valid excuse. The use of any illegal substances is prohibited. 10. Adopted CDC guidelines & recommendations: Target dosing  levels will be at or below 60 MME/day. Use of benzodiazepines** is not recommended.  Exceptions: There are only two exceptions to the rule of not receiving pain medications from other Healthcare Providers. 1. Exception #1 (Emergencies): In the event of an emergency (i.e.: accident requiring emergency care), you are allowed to receive additional pain medication. However, you are responsible for: As soon as you are able, call our office [phone], at any time of the day or night, and leave a message stating your name, the date and nature of the emergency, and the name and dose of the medication prescribed. In the event that your call is answered by a member of our staff, make sure to document and save the date, time, and the name of the person that took your information.  2. Exception #2 (Planned Surgery): In the event that you are scheduled by another doctor or dentist to have any type of surgery or procedure, you are allowed (for a period no longer than 30 days), to receive additional pain medication, for the acute post-op pain. However, in this case, you are responsible for picking up a copy of our "Post-op Pain Management for Surgeons" handout, and giving it to your surgeon or dentist. This document is available at our office, and does not require an appointment to obtain it. Simply go to our office during business hours (Monday-Thursday from 8:00 AM to 4:00 PM) (Friday 8:00 AM to 12:00 Noon) or if you have a scheduled appointment with [place], prior to your surgery, and ask for it by name. In addition, you will need to provide [place] with your name, name of your surgeon, type of surgery, and date of procedure or surgery.  *Opioid medications include: morphine, codeine, oxycodone, oxymorphone, hydrocodone, hydromorphone, meperidine, tramadol, tapentadol, buprenorphine, fentanyl, methadone. **Benzodiazepine medications include: diazepam (Valium), alprazolam (Xanax), clonazepam (Klonopine), lorazepam (Ativan),  clorazepate (  Tranxene), chlordiazepoxide (Librium), estazolam (Prosom), oxazepam (Serax), temazepam (Restoril), triazolam (Halcion) (Last updated: 06/18/2017) ____________________________________________________________________________________________    BMI Assessment: Estimated body mass index is 53.14 kg/m as calculated from the following:   Height as of this encounter: '5\' 3"'  (1.6 m).   Weight as of this encounter: 300 lb (136.1 kg).  BMI interpretation table: BMI level Category Range association with higher incidence of chronic pain  <18 kg/m2 Underweight   18.5-24.9 kg/m2 Ideal body weight   25-29.9 kg/m2 Overweight Increased incidence by 20%  30-34.9 kg/m2 Obese (Class I) Increased incidence by 68%  35-39.9 kg/m2 Severe obesity (Class II) Increased incidence by 136%  >40 kg/m2 Extreme obesity (Class III) Increased incidence by 254%   Patient's current BMI Ideal Body weight  Body mass index is 53.14 kg/m. Ideal body weight: 52.4 kg (115 lb 8.3 oz) Adjusted ideal body weight: 85.9 kg (189 lb 5 oz)   BMI Readings from Last 4 Encounters:  10/21/17 53.14 kg/m  07/23/17 52.26 kg/m  04/27/17 53.14 kg/m  01/13/17 52.26 kg/m   Wt Readings from Last 4 Encounters:  10/21/17 300 lb (136.1 kg)  07/23/17 295 lb (133.8 kg)  04/27/17 300 lb (136.1 kg)  01/13/17 295 lb (133.8 kg)

## 2017-10-21 NOTE — Patient Instructions (Addendum)
Hydrocodone - apap 10-325 x 3 months to begin filling on 11/02/17 given to patient today.   Medication Rules  Applies to: All patients receiving prescriptions (written or electronic).  Pharmacy of record: Pharmacy where electronic prescriptions will be sent. If written prescriptions are taken to a different pharmacy, please inform the nursing staff. The pharmacy listed in the electronic medical record should be the one where you would like electronic prescriptions to be sent.  Prescription refills: Only during scheduled appointments. Applies to both, written and electronic prescriptions.  NOTE: The following applies primarily to controlled substances (Opioid* Pain Medications).   Patient's responsibilities: 1. Pain Pills: Bring all pain pills to every appointment (except for procedure appointments). 2. Pill Bottles: Bring pills in original pharmacy bottle. Always bring newest bottle. Bring bottle, even if empty. 3. Medication refills: You are responsible for knowing and keeping track of what medications you need refilled. The day before your appointment, write a list of all prescriptions that need to be refilled. Bring that list to your appointment and give it to the admitting nurse. Prescriptions will be written only during appointments. If you forget a medication, it will not be "Called in", "Faxed", or "electronically sent". You will need to get another appointment to get these prescribed. 4. Prescription Accuracy: You are responsible for carefully inspecting your prescriptions before leaving our office. Have the discharge nurse carefully go over each prescription with you, before taking them home. Make sure that your name is accurately spelled, that your address is correct. Check the name and dose of your medication to make sure it is accurate. Check the number of pills, and the written instructions to make sure they are clear and accurate. Make sure that you are given enough medication to last  until your next medication refill appointment. 5. Taking Medication: Take medication as prescribed. Never take more pills than instructed. Never take medication more frequently than prescribed. Taking less pills or less frequently is permitted and encouraged, when it comes to controlled substances (written prescriptions).  6. Inform other Doctors: Always inform, all of your healthcare providers, of all the medications you take. 7. Pain Medication from other Providers: You are not allowed to accept any additional pain medication from any other Doctor or Healthcare provider. There are two exceptions to this rule. (see below) In the event that you require additional pain medication, you are responsible for notifying us, as stated below. 8. Medication Agreement: You are responsible for carefully reading and following our Medication Agreement. This must be signed before receiving any prescriptions from our practice. Safely store a copy of your signed Agreement. Violations to the Agreement will result in no further prescriptions. (Additional copies of our Medication Agreement are available upon request.) 9. Laws, Rules, & Regulations: All patients are expected to follow all Federal and [employer], [employer], Rules, [employer]. Ignorance of the Laws does not constitute a valid excuse. The use of any illegal substances is prohibited. 10. Adopted CDC guidelines & recommendations: Target dosing levels will be at or below 60 MME/day. Use of benzodiazepines** is not recommended.  Exceptions: There are only two exceptions to the rule of not receiving pain medications from other Healthcare Providers. 1. Exception #1 (Emergencies): In the event of an emergency (i.e.: accident requiring emergency care), you are allowed to receive additional pain medication. However, you are responsible for: As soon as you are able, call our office [phone], at any time of the day or night, and leave a message stating your name,  the  date and nature of the emergency, and the name and dose of the medication prescribed. In the event that your call is answered by a member of our staff, make sure to document and save the date, time, and the name of the person that took your information.  2. Exception #2 (Planned Surgery): In the event that you are scheduled by another doctor or dentist to have any type of surgery or procedure, you are allowed (for a period no longer than 30 days), to receive additional pain medication, for the acute post-op pain. However, in this case, you are responsible for picking up a copy of our "Post-op Pain Management for Surgeons" handout, and giving it to your surgeon or dentist. This document is available at our office, and does not require an appointment to obtain it. Simply go to our office during business hours (Monday-Thursday from 8:00 AM to 4:00 PM) (Friday 8:00 AM to 12:00 Noon) or if you have a scheduled appointment with [place], prior to your surgery, and ask for it by name. In addition, you will need to provide [place] with your name, name of your surgeon, type of surgery, and date of procedure or surgery.  *Opioid medications include: morphine, codeine, oxycodone, oxymorphone, hydrocodone, hydromorphone, meperidine, tramadol, tapentadol, buprenorphine, fentanyl, methadone. **Benzodiazepine medications include: diazepam (Valium), alprazolam (Xanax), clonazepam (Klonopine), lorazepam (Ativan), clorazepate (Tranxene), chlordiazepoxide (Librium), estazolam (Prosom), oxazepam (Serax), temazepam (Restoril), triazolam (Halcion) (Last updated: 06/18/2017) ____________________________________________________________________________________________    BMI Assessment: Estimated body mass index is 53.14 kg/m as calculated from the following:   Height as of this encounter: 5\' 3"  (1.6 m).   Weight as of this encounter: 300 lb (136.1 kg).  BMI interpretation table: BMI level Category Range association with higher  incidence of chronic pain  <18 kg/m2 Underweight   18.5-24.9 kg/m2 Ideal body weight   25-29.9 kg/m2 Overweight Increased incidence by 20%  30-34.9 kg/m2 Obese (Class I) Increased incidence by 68%  35-39.9 kg/m2 Severe obesity (Class II) Increased incidence by 136%  >40 kg/m2 Extreme obesity (Class III) Increased incidence by 254%   Patient's current BMI Ideal Body weight  Body mass index is 53.14 kg/m. Ideal body weight: 52.4 kg (115 lb 8.3 oz) Adjusted ideal body weight: 85.9 kg (189 lb 5 oz)   BMI Readings from Last 4 Encounters:  10/21/17 53.14 kg/m  07/23/17 52.26 kg/m  04/27/17 53.14 kg/m  01/13/17 52.26 kg/m   Wt Readings from Last 4 Encounters:  10/21/17 300 lb (136.1 kg)  07/23/17 295 lb (133.8 kg)  04/27/17 300 lb (136.1 kg)  01/13/17 295 lb (133.8 kg)   ____________________________________________________________________________________________  CANNABIDIOL (AKA: CBD Oil or Pills)  Applies to: All patients receiving prescriptions of controlled substances (written and/or electronic).  General Information: Cannabidiol (CBD) was discovered in 7. It is one of some 113 identified cannabinoids in cannabis (Marijuana) plants, accounting for up to 40% of the plant's extract. As of 2018, preliminary clinical research on cannabidiol included studies of anxiety, cognition, movement disorders, and pain.  Cannabidiol is consummed in multiple ways, including inhalation of cannabis smoke or vapor, as an aerosol spray into the cheek, and by mouth. It may be supplied as CBD oil containing CBD as the active ingredient (no added tetrahydrocannabinol (THC) or terpenes), a full-plant CBD-dominant hemp extract oil, capsules, dried cannabis, or as a liquid solution. CBD is thought not have the same psychoactivity as THC, and may affect the actions of THC. Studies suggest that CBD may interact with different biological targets, including  cannabinoid receptors and other neurotransmitter  receptors. As of 2018 the mechanism of action for its biological effects has not been determined.  In the [place], cannabidiol has a limited approval by the Food and Drug Administration (FDA) for treatment of only two types of epilepsy disorders. The side effects of long-term use of the drug include somnolence, decreased appetite, diarrhea, fatigue, malaise, weakness, sleeping problems, and others.  CBD remains a Schedule I drug prohibited for any use.  Legality: Some manufacturers ship CBD products nationally, an illegal action which the FDA has not enforced in 2018, with CBD remaining the subject of an FDA investigational new drug evaluation, and is not considered legal as a dietary supplement or food ingredient as of December 2018. Federal illegality has made it difficult historically to conduct research on CBD. CBD is openly sold in head shops and health food stores in some states where such sales have not been explicitly legalized.  Warning: Because it is not FDA approved for general use or treatment of pain, it is not required to undergo the same manufacturing controls as prescription drugs.  This means that the available cannabidiol (CBD) may be contaminated with THC.  If this is the case, it will trigger a positive urine drug screen (UDS) test for cannabinoids (Marijuana).  Because a positive UDS for illicit substances is a violation of our medication agreement, your opioid analgesics (pain medicine) may be permanently discontinued. (Last update: 07/09/2017) ____________________________________________________________________________________________

## 2017-10-21 NOTE — Progress Notes (Signed)
Nursing Pain Medication Assessment:  Safety precautions to be maintained throughout the outpatient stay will include: orient to surroundings, keep bed in low position, maintain call bell within reach at all times, provide assistance with transfer out of bed and ambulation.  Medication Inspection Compliance: Pill count conducted under aseptic conditions, in front of the patient. Neither the pills nor the bottle was removed from the patient's sight at any time. Once count was completed pills were immediately returned to the patient in their original bottle.  Medication: Hydrocodone/APAP Pill/Patch Count: 54 of 120 pills remain Pill/Patch Appearance: Markings consistent with prescribed medication Bottle Appearance: Standard pharmacy container. Clearly labeled. Filled Date: 06 / 16 / 2019 Last Medication intake:  Today

## 2017-10-29 DIAGNOSIS — Z136 Encounter for screening for cardiovascular disorders: Secondary | ICD-10-CM | POA: Diagnosis not present

## 2017-10-29 DIAGNOSIS — Z0000 Encounter for general adult medical examination without abnormal findings: Secondary | ICD-10-CM | POA: Diagnosis not present

## 2017-10-29 DIAGNOSIS — Z79899 Other long term (current) drug therapy: Secondary | ICD-10-CM | POA: Diagnosis not present

## 2017-10-29 DIAGNOSIS — I10 Essential (primary) hypertension: Secondary | ICD-10-CM | POA: Diagnosis not present

## 2017-11-02 DIAGNOSIS — E038 Other specified hypothyroidism: Secondary | ICD-10-CM | POA: Diagnosis not present

## 2017-11-02 DIAGNOSIS — E063 Autoimmune thyroiditis: Secondary | ICD-10-CM | POA: Diagnosis not present

## 2017-11-04 DIAGNOSIS — L219 Seborrheic dermatitis, unspecified: Secondary | ICD-10-CM | POA: Diagnosis not present

## 2017-11-04 DIAGNOSIS — L719 Rosacea, unspecified: Secondary | ICD-10-CM | POA: Diagnosis not present

## 2017-11-26 DIAGNOSIS — R300 Dysuria: Secondary | ICD-10-CM | POA: Diagnosis not present

## 2017-12-09 DIAGNOSIS — H1855 Macular corneal dystrophy: Secondary | ICD-10-CM | POA: Diagnosis not present

## 2017-12-15 DIAGNOSIS — H66001 Acute suppurative otitis media without spontaneous rupture of ear drum, right ear: Secondary | ICD-10-CM | POA: Diagnosis not present

## 2017-12-15 DIAGNOSIS — J209 Acute bronchitis, unspecified: Secondary | ICD-10-CM | POA: Diagnosis not present

## 2017-12-21 DIAGNOSIS — R05 Cough: Secondary | ICD-10-CM | POA: Diagnosis not present

## 2017-12-21 DIAGNOSIS — J209 Acute bronchitis, unspecified: Secondary | ICD-10-CM | POA: Diagnosis not present

## 2018-01-21 ENCOUNTER — Ambulatory Visit: Payer: 59 | Attending: Nurse Practitioner | Admitting: Nurse Practitioner

## 2018-01-21 ENCOUNTER — Other Ambulatory Visit: Payer: Self-pay

## 2018-01-21 ENCOUNTER — Encounter: Payer: Self-pay | Admitting: Nurse Practitioner

## 2018-01-21 VITALS — BP 157/99 | HR 96 | Temp 98.2°F | Resp 18 | Ht 63.0 in | Wt 290.0 lb

## 2018-01-21 DIAGNOSIS — T402X5A Adverse effect of other opioids, initial encounter: Secondary | ICD-10-CM

## 2018-01-21 DIAGNOSIS — G894 Chronic pain syndrome: Secondary | ICD-10-CM | POA: Diagnosis not present

## 2018-01-21 DIAGNOSIS — Z5181 Encounter for therapeutic drug level monitoring: Secondary | ICD-10-CM | POA: Insufficient documentation

## 2018-01-21 DIAGNOSIS — I10 Essential (primary) hypertension: Secondary | ICD-10-CM | POA: Diagnosis not present

## 2018-01-21 DIAGNOSIS — E559 Vitamin D deficiency, unspecified: Secondary | ICD-10-CM | POA: Diagnosis not present

## 2018-01-21 DIAGNOSIS — M545 Low back pain: Secondary | ICD-10-CM | POA: Insufficient documentation

## 2018-01-21 DIAGNOSIS — Z6841 Body Mass Index (BMI) 40.0 and over, adult: Secondary | ICD-10-CM | POA: Insufficient documentation

## 2018-01-21 DIAGNOSIS — E039 Hypothyroidism, unspecified: Secondary | ICD-10-CM | POA: Diagnosis not present

## 2018-01-21 DIAGNOSIS — M47816 Spondylosis without myelopathy or radiculopathy, lumbar region: Secondary | ICD-10-CM | POA: Diagnosis not present

## 2018-01-21 DIAGNOSIS — M47897 Other spondylosis, lumbosacral region: Secondary | ICD-10-CM | POA: Insufficient documentation

## 2018-01-21 DIAGNOSIS — Z79891 Long term (current) use of opiate analgesic: Secondary | ICD-10-CM | POA: Diagnosis not present

## 2018-01-21 DIAGNOSIS — M25571 Pain in right ankle and joints of right foot: Secondary | ICD-10-CM | POA: Diagnosis not present

## 2018-01-21 DIAGNOSIS — M4307 Spondylolysis, lumbosacral region: Secondary | ICD-10-CM | POA: Diagnosis not present

## 2018-01-21 DIAGNOSIS — M170 Bilateral primary osteoarthritis of knee: Secondary | ICD-10-CM | POA: Insufficient documentation

## 2018-01-21 DIAGNOSIS — Z79890 Hormone replacement therapy (postmenopausal): Secondary | ICD-10-CM | POA: Insufficient documentation

## 2018-01-21 DIAGNOSIS — M25552 Pain in left hip: Secondary | ICD-10-CM | POA: Insufficient documentation

## 2018-01-21 DIAGNOSIS — K5903 Drug induced constipation: Secondary | ICD-10-CM | POA: Diagnosis not present

## 2018-01-21 DIAGNOSIS — M7918 Myalgia, other site: Secondary | ICD-10-CM | POA: Insufficient documentation

## 2018-01-21 DIAGNOSIS — G510 Bell's palsy: Secondary | ICD-10-CM | POA: Diagnosis not present

## 2018-01-21 DIAGNOSIS — M25572 Pain in left ankle and joints of left foot: Secondary | ICD-10-CM | POA: Diagnosis not present

## 2018-01-21 DIAGNOSIS — G8929 Other chronic pain: Secondary | ICD-10-CM | POA: Diagnosis not present

## 2018-01-21 DIAGNOSIS — E063 Autoimmune thyroiditis: Secondary | ICD-10-CM | POA: Diagnosis not present

## 2018-01-21 DIAGNOSIS — Z880 Allergy status to penicillin: Secondary | ICD-10-CM | POA: Insufficient documentation

## 2018-01-21 DIAGNOSIS — Z8249 Family history of ischemic heart disease and other diseases of the circulatory system: Secondary | ICD-10-CM | POA: Insufficient documentation

## 2018-01-21 DIAGNOSIS — M5137 Other intervertebral disc degeneration, lumbosacral region: Secondary | ICD-10-CM | POA: Insufficient documentation

## 2018-01-21 DIAGNOSIS — M25561 Pain in right knee: Secondary | ICD-10-CM | POA: Diagnosis not present

## 2018-01-21 DIAGNOSIS — M25562 Pain in left knee: Secondary | ICD-10-CM

## 2018-01-21 DIAGNOSIS — E042 Nontoxic multinodular goiter: Secondary | ICD-10-CM | POA: Insufficient documentation

## 2018-01-21 DIAGNOSIS — Z791 Long term (current) use of non-steroidal anti-inflammatories (NSAID): Secondary | ICD-10-CM | POA: Insufficient documentation

## 2018-01-21 DIAGNOSIS — Z882 Allergy status to sulfonamides status: Secondary | ICD-10-CM | POA: Insufficient documentation

## 2018-01-21 DIAGNOSIS — Z79899 Other long term (current) drug therapy: Secondary | ICD-10-CM | POA: Insufficient documentation

## 2018-01-21 MED ORDER — HYDROCODONE-ACETAMINOPHEN 10-325 MG PO TABS: 1.0000 | ORAL_TABLET | Freq: Four times a day (QID) | ORAL | 0 refills | Status: DC | PRN

## 2018-01-21 NOTE — Progress Notes (Signed)
Nursing Pain Medication Assessment:  Safety precautions to be maintained throughout the outpatient stay will include: orient to surroundings, keep bed in low position, maintain call bell within reach at all times, provide assistance with transfer out of bed and ambulation.  Medication Inspection Compliance: Pill count conducted under aseptic conditions, in front of the patient. Neither the pills nor the bottle was removed from the patient's sight at any time. Once count was completed pills were immediately returned to the patient in their original bottle.  Medication: Hydrocodone/APAP Pill/Patch Count: 40 of 120 pills remain Pill/Patch Appearance: Markings consistent with prescribed medication Bottle Appearance: Standard pharmacy container. Clearly labeled. Filled Date: 09 / 13 / 2019 Last Medication intake:  Today

## 2018-01-21 NOTE — Patient Instructions (Addendum)
____________________________________________________________________________________________  Medication Rules  Applies to: All patients receiving prescriptions (written or electronic).  Pharmacy of record: Pharmacy where electronic prescriptions will be sent. If written prescriptions are taken to a different pharmacy, please inform the nursing staff. The pharmacy listed in the electronic medical record should be the one where you would like electronic prescriptions to be sent.  Prescription refills: Only during scheduled appointments. Applies to both, written and electronic prescriptions.  NOTE: The following applies primarily to controlled substances (Opioid* Pain Medications).   Patient's responsibilities: 1. Pain Pills: Bring all pain pills to every appointment (except for procedure appointments). 2. Pill Bottles: Bring pills in original pharmacy bottle. Always bring newest bottle. Bring bottle, even if empty. 3. Medication refills: You are responsible for knowing and keeping track of what medications you need refilled. The day before your appointment, write a list of all prescriptions that need to be refilled. Bring that list to your appointment and give it to the admitting nurse. Prescriptions will be written only during appointments. If you forget a medication, it will not be "Called in", "Faxed", or "electronically sent". You will need to get another appointment to get these prescribed. 4. Prescription Accuracy: You are responsible for carefully inspecting your prescriptions before leaving our office. Have the discharge nurse carefully go over each prescription with you, before taking them home. Make sure that your name is accurately spelled, that your address is correct. Check the name and dose of your medication to make sure it is accurate. Check the number of pills, and the written instructions to make sure they are clear and accurate. Make sure that you are given enough medication to last  until your next medication refill appointment. 5. Taking Medication: Take medication as prescribed. Never take more pills than instructed. Never take medication more frequently than prescribed. Taking less pills or less frequently is permitted and encouraged, when it comes to controlled substances (written prescriptions).  6. Inform other Doctors: Always inform, all of your healthcare providers, of all the medications you take. 7. Pain Medication from other Providers: You are not allowed to accept any additional pain medication from any other Doctor or Healthcare provider. There are two exceptions to this rule. (see below) In the event that you require additional pain medication, you are responsible for notifying us, as stated below. 8. Medication Agreement: You are responsible for carefully reading and following our Medication Agreement. This must be signed before receiving any prescriptions from our practice. Safely store a copy of your signed Agreement. Violations to the Agreement will result in no further prescriptions. (Additional copies of our Medication Agreement are available upon request.) 9. Laws, Rules, & Regulations: All patients are expected to follow all Federal and State Laws, Statutes, Rules, & Regulations. Ignorance of the Laws does not constitute a valid excuse. The use of any illegal substances is prohibited. 10. Adopted CDC guidelines & recommendations: Target dosing levels will be at or below 60 MME/day. Use of benzodiazepines** is not recommended.  Exceptions: There are only two exceptions to the rule of not receiving pain medications from other Healthcare Providers. 1. Exception #1 (Emergencies): In the event of an emergency (i.e.: accident requiring emergency care), you are allowed to receive additional pain medication. However, you are responsible for: As soon as you are able, call our office [phone], at any time of the day or night, and leave a message stating your name, the  date and nature of the emergency, and the name and dose of the medication   prescribed. In the event that your call is answered by a member of our staff, make sure to document and save the date, time, and the name of the person that took your information.  2. Exception #2 (Planned Surgery): In the event that you are scheduled by another doctor or dentist to have any type of surgery or procedure, you are allowed (for a period no longer than 30 days), to receive additional pain medication, for the acute post-op pain. However, in this case, you are responsible for picking up a copy of our "Post-op Pain Management for Surgeons" handout, and giving it to your surgeon or dentist. This document is available at our office, and does not require an appointment to obtain it. Simply go to our office during business hours (Monday-Thursday from 8:00 AM to 4:00 PM) (Friday 8:00 AM to 12:00 Noon) or if you have a scheduled appointment with [place], prior to your surgery, and ask for it by name. In addition, you will need to provide [place] with your name, name of your surgeon, type of surgery, and date of procedure or surgery.  *Opioid medications include: morphine, codeine, oxycodone, oxymorphone, hydrocodone, hydromorphone, meperidine, tramadol, tapentadol, buprenorphine, fentanyl, methadone. **Benzodiazepine medications include: diazepam (Valium), alprazolam (Xanax), clonazepam (Klonopine), lorazepam (Ativan), clorazepate (Tranxene), chlordiazepoxide (Librium), estazolam (Prosom), oxazepam (Serax), temazepam (Restoril), triazolam (Halcion) (Last updated: 06/18/2017) ____________________________________________________________________________________________    BMI Assessment: Estimated body mass index is 51.37 kg/m as calculated from the following:   Height as of this encounter: 5\' 3"  (1.6 m).   Weight as of this encounter: 290 lb (131.5 kg).  BMI interpretation table: BMI level Category Range association with higher  incidence of chronic pain  <18 kg/m2 Underweight   18.5-24.9 kg/m2 Ideal body weight   25-29.9 kg/m2 Overweight Increased incidence by 20%  30-34.9 kg/m2 Obese (Class I) Increased incidence by 68%  35-39.9 kg/m2 Severe obesity (Class II) Increased incidence by 136%  >40 kg/m2 Extreme obesity (Class III) Increased incidence by 254%   Patient's current BMI Ideal Body weight  Body mass index is 51.37 kg/m. Ideal body weight: 52.4 kg (115 lb 8.3 oz) Adjusted ideal body weight: 84.1 kg (185 lb 5 oz)   BMI Readings from Last 4 Encounters:  01/21/18 51.37 kg/m  10/21/17 53.14 kg/m  07/23/17 52.26 kg/m  04/27/17 53.14 kg/m   Wt Readings from Last 4 Encounters:  01/21/18 290 lb (131.5 kg)  10/21/17 300 lb (136.1 kg)  07/23/17 295 lb (133.8 kg)  04/27/17 300 lb (136.1 kg)  ALL SCRIPTS WERE E-SCRIBED TO YOUR PHARMACY.

## 2018-01-21 NOTE — Progress Notes (Signed)
Patient's Name: Alison Cox  MRN: [id number]  Referring Provider: Lajean Manes, MD  DOB: [birth year]  PCP: Alison Manes, MD  DOS: 01/21/2018  Note by: Alison Francois NP  Service setting: Ambulatory outpatient  Specialty: Interventional Pain Management  Location: [facility] (AMB) Pain Management Facility    Patient type: Established    Primary Reason(s) for Visit: Encounter for prescription drug management. (Level of risk: moderate)  CC: Knee Pain (bilateral)  HPI  Alison Cox is a 62 y.o. year old, female patient, who comes today for a medication management evaluation. 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; Long term current use of opiate analgesic; Long term prescription opiate use; Opiate use (40 MME/Day); Encounter for therapeutic drug level monitoring; Opioid dependence ([place]); Lumbar spondylosis; Grade 1 Anterolisthesis of L4 over L5; Therapeutic opioid-induced constipation (OIC); Chronic knee pain (Location of Primary Source of Pain) (Bilateral) (R>L); Osteoarthritis of knees (Location of Primary Source of Pain) (Bilateral) (R>L); Vitamin D insufficiency; History of Bell's palsy (left-sided); History of nephrolithiasis; History of Hashimoto thyroiditis; Osteoarthrosis; Obesity, Class III, BMI 40-49.9 (morbid obesity) (HCC) (254% higher incidence of chronic low back pain); Spondylolysis of lumbosacral region (Advanced disc degeneration and spondylosis L5-S1); Osteoarthritis of ankles (Location of Secondary source of pain) (Bilateral) (L>R); Chronic low back pain (Location of Tertiary source of pain) (midline); Chronic hip pain (Left); Chronic pain syndrome; Musculoskeletal pain; Chronic ankle pain (Location of Secondary source of pain) (Bilateral) (L>R); Chronic autoimmune thyroiditis; Hypothyroidism due to Hashimoto's thyroiditis; Nontoxic thyroid nodule; and Knee pain on their problem list. Her primarily concern today is  the Knee Pain (bilateral)  Pain Assessment: Location: Left, Right Knee Radiating: ankles Onset: More than a month ago Duration: Chronic pain Quality: Aching, Sharp Severity: 3 /10 (subjective, self-reported pain score)  Note: Reported level is compatible with observation.                          Effect on ADL:   Timing: Intermittent Modifying factors: medications, rest BP: (!) 157/99  HR: 96  Alison Cox was last scheduled for an appointment on 10/21/2017 for medication management. During today's appointment we reviewed Alison Cox's chronic pain status, as well as her outpatient medication regimen.  Denies any new concerns with her knee pain.  She just admits that she is bone-on-bone and is aware that she needs to lose weight in order to help her pain and in order to get knee replacement in the future.  She admits that she has had bilateral steroid injections and Synvisc injections in the past which were effective for about 3 months.  She denies any side effects of her current regimen.  She admits that she eats a lot of fruits and vegetables.  The patient  reports that she does not use drugs. Her body mass index is 51.37 kg/m.  Further details on both, my assessment(s), as well as the proposed treatment plan, please see below.  Controlled Substance Pharmacotherapy Assessment REMS (Risk Evaluation and Mitigation Strategy)  Analgesic:Hydrocodone/APAP 10/325 one tablet by mouth every 6 hours (total: 40 mg/day) MME/day:40 mg/day Alison Rochester, RN  01/21/2018  3:52 PM  Sign at close encounter Nursing Pain Medication Assessment:  Safety precautions to be maintained throughout the outpatient stay will include: orient to surroundings, keep bed in low position, maintain call bell within reach at all times, provide assistance with transfer out of bed and ambulation.  Medication Inspection Compliance:  Pill count conducted under aseptic conditions, in front of the patient. Neither the pills nor  the bottle was removed from the patient's sight at any time. Once count was completed pills were immediately returned to the patient in their original bottle.  Medication: Hydrocodone/APAP Pill/Patch Count: 40 of 120 pills remain Pill/Patch Appearance: Markings consistent with prescribed medication Bottle Appearance: Standard pharmacy container. Clearly labeled. Filled Date: 09 / 13 / 2019 Last Medication intake:  Today   Pharmacokinetics: Liberation and absorption (onset of action): WNL Distribution (time to peak effect): WNL Metabolism and excretion (duration of action): WNL         Pharmacodynamics: Desired effects: Analgesia: Alison Cox reports >50% benefit. Functional ability: Patient reports that medication allows her to accomplish basic ADLs Clinically meaningful improvement in function (CMIF): Sustained CMIF goals met Perceived effectiveness: Described as relatively effective, allowing for increase in activities of daily living (ADL) Undesirable effects: Side-effects or Adverse reactions: None reported Monitoring: [place] PMP: Online review of the past 9-monthperiod conducted. Compliant with practice rules and regulations Last UDS on record: Summary  Date Value Ref Range Status  10/14/2016 FINAL  Final    Comment:    ==================================================================== TOXASSURE SELECT 13 (MW) ==================================================================== Test                             Result       Flag       Units Drug Present and Declared for Prescription Verification   Hydrocodone                    1874         EXPECTED   ng/mg creat   Hydromorphone                  503          EXPECTED   ng/mg creat   Dihydrocodeine                 461          EXPECTED   ng/mg creat   Norhydrocodone                 1191         EXPECTED   ng/mg creat    Sources of hydrocodone include scheduled prescription    medications. Hydromorphone, dihydrocodeine and  norhydrocodone are    expected metabolites of hydrocodone. Hydromorphone and    dihydrocodeine are also available as scheduled prescription    medications. ==================================================================== Test                      Result    Flag   Units      Ref Range   Creatinine              185              mg/dL      >=20 ==================================================================== Declared Medications:  The flagging and interpretation on this report are based on the  following declared medications.  Unexpected results may arise from  inaccuracies in the declared medications.  **Note: The testing scope of this panel includes these medications:  Hydrocodone (Hydrocodone-Acetaminophen)  **Note: The testing scope of this panel does not include following  reported medications:  Acetaminophen (Hydrocodone-Acetaminophen)  Amlodipine Besylate  Calcium Carbonate  Cholecalciferol  Doxycycline  Eye Drop  Hydrochlorothiazide  Levothyroxine  Magnesium Oxide  Meloxicam  Potassium  Prednisolone ==================================================================== For clinical consultation, please call [phone]. ====================================================================    UDS interpretation: Compliant          Medication Assessment Form: Reviewed. Patient indicates being compliant with therapy Treatment compliance: Compliant Risk Assessment Profile: Aberrant behavior: See prior evaluations. None observed or detected today Comorbid factors increasing risk of overdose: See prior notes. No additional risks detected today Opioid risk tool (ORT) (Total Score): 0 Personal History of Substance Abuse (SUD-Substance use disorder):  Alcohol: Negative  Illegal Drugs:    Rx Drugs: Negative  ORT Risk Level calculation: Low Risk Risk of substance use disorder (SUD): Low Opioid Risk Tool - 01/21/18 0944      Family History of Substance Abuse    Alcohol  Negative    Illegal Drugs  Negative    Rx Drugs  Negative      Personal History of Substance Abuse   Alcohol  Negative    Rx Drugs  Negative      Age   Age between 30-45 years   No      History of Preadolescent Sexual Abuse   History of Preadolescent Sexual Abuse  Negative or Female      Psychological Disease   Psychological Disease  Negative    Depression  Negative      Total Score   Opioid Risk Tool Scoring  0    Opioid Risk Interpretation  Low Risk      ORT Scoring interpretation table:  Score <3 = Low Risk for SUD  Score between 4-7 = Moderate Risk for SUD  Score >8 = High Risk for Opioid Abuse   Risk Mitigation Strategies:  Patient Counseling: Covered Patient-Prescriber Agreement (PPA): Present and active  Notification to other healthcare providers: Done  Pharmacologic Plan: No change in therapy, at this time.             Laboratory Chemistry  Inflammation Markers (CRP: Acute Phase) (ESR: Chronic Phase) Lab Results  Component Value Date   CRP <0.8 05/01/2016   ESRSEDRATE 8 05/01/2016                         Rheumatology Markers No results found for: RF, ANA, LABURIC, URICUR, LYMEIGGIGMAB, LYMEABIGMQN, HLAB27                      Renal Function Markers Lab Results  Component Value Date   BUN 18 05/01/2016   CREATININE 0.82 05/01/2016   GFRAA >60 05/01/2016   GFRNONAA >60 05/01/2016                             Hepatic Function Markers Lab Results  Component Value Date   AST 37 05/01/2016   ALT 47 05/01/2016   ALBUMIN 4.6 05/01/2016   ALKPHOS 93 05/01/2016                        Electrolytes Lab Results  Component Value Date   NA 141 05/01/2016   K 4.0 05/01/2016   CL 106 05/01/2016   CALCIUM 10.0 05/01/2016   MG 2.0 05/01/2016                        Neuropathy Markers Lab Results  Component Value Date   VITAMINB12 302 05/01/2016  CNS Tests No results found for: COLORCSF, APPEARCSF, RBCCOUNTCSF, WBCCSF,  POLYSCSF, LYMPHSCSF, EOSCSF, PROTEINCSF, GLUCCSF, JCVIRUS, CSFOLI, IGGCSF                      Bone Pathology Markers Lab Results  Component Value Date   25OHVITD1 32 05/01/2016   25OHVITD2 2.2 05/01/2016   25OHVITD3 30 05/01/2016                         Coagulation Parameters No results found for: INR, LABPROT, APTT, PLT, DDIMER                      Cardiovascular Markers Lab Results  Component Value Date   HGB 15.6 (H) 10/11/2011   HCT 46.0 10/11/2011                         CA Markers No results found for: CEA, CA125, LABCA2                      Note: Lab results reviewed.  Recent Diagnostic Imaging Results  US THYROID CLINICAL DATA:  Follow-up thyroid nodules.  EXAM: THYROID ULTRASOUND  TECHNIQUE: Ultrasound examination of the thyroid gland and adjacent soft tissues was performed.  COMPARISON:  Thyroid ultrasound - 09/27/2013 ; 10/04/2012; 02/26/2012; ultrasound-guided right-sided thyroid nodule biopsy - 03/30/2012  FINDINGS: Grossly unchanged diffuse heterogeneity of the thyroid parenchymal echotexture and. No definitive new or enlarging thyroid nodules.  Right thyroid lobe  Measurements: Slightly diminutive in size measuring 3.2 x 1.4 x 2.0 cm, previously, 5.1 x 1.9 x 1.7 cm.  Right, mid, anterior - 1.2 x 1.0 x 2.0 cm - mixed echogenic, solid - grossly unchanged since the 02/2012 examination, previously, 2.0 x 0.9 x 1.4 cm. This nodule was previously biopsied.  Left thyroid lobe  Measurements: Slightly diminutive in size measuring 3.3 x 1.6 x 1.8 cm, unchanged, previously, 5.4 x 2.0 x 1.4 cm.  Left, superior - 0.5 cm - echogenic dystrophic calcification with posterior acoustic shadowing - unchanged since the 02/2012 examination  Isthmus  Thickness: Normal in size measuring 0.6 cm in diameter, unchanged.  There is mild diffuse heterogeneity of the thyroid isthmus without discrete nodule or mass.  Lymphadenopathy  None  visualized.  IMPRESSION: 1. No new or enlarging thyroid nodules with all discretely measured thyroid nodules including previously biopsied dominant approximately 2 cm nodule within the right lobe of thyroid appearing unchanged since most remote examination performed 02/2012. 2. Nonspecific apparent mild interval atrophy of both the right and left lobes of the thyroid with the right lobe now measuring 3.2 cm in maximal diameter (previously, 5.1 cm) and the left lobe measuring 3.3 cm (previously, 5.4 cm). Correlation with thyroid function tests is recommended.  Electronically Signed   By: Sandi Mariscal M.D.   On: 10/15/2015 15:26  Complexity Note: Imaging results reviewed. Results shared with Ms. [name], using Layman's terms.                         Meds   Current Outpatient Medications:  .  amLODipine (NORVASC) 5 MG tablet, Take 5 mg by mouth daily., Disp: , Rfl:  .  calcium carbonate (CALCIUM 600) 1500 (600 Ca) MG TABS tablet, Take by mouth 2 (two) times daily with a meal., Disp: , Rfl:  .  Cholecalciferol ([employer] VITAMIN D) 2000 units CAPS, Take 1  capsule (2,000 Units total) by mouth daily., Disp: 30 each, Rfl: 6 .  Ciclopirox 1 % shampoo, Apply 1 application topically as needed., Disp: , Rfl: 6 .  Crisaborole (EUCRISA) 2 % OINT, Apply 1 application topically as needed., Disp: , Rfl:  .  CVS MAGNESIUM OXIDE 500 MG TABS, TAKE 1 CAPSULE (500 MG TOTAL) BY MOUTH DAILY., Disp: 90 tablet, Rfl: 0 .  Difluprednate (DUREZOL) 0.05 % EMUL, Place 1 drop into the right eye 2 (two) times daily., Disp: , Rfl:  .  doxycycline (DORYX) 100 MG DR capsule, Take 100 mg by mouth 2 (two) times daily., Disp: , Rfl:  .  Fluocinolone Acetonide 0.01 % OIL, Place 1 drop into both ears as needed., Disp: , Rfl:  .  hydrochlorothiazide (HYDRODIURIL) 25 MG tablet, Take 25 mg by mouth daily., Disp: , Rfl:  .  [START ON 04/01/2018] HYDROcodone-acetaminophen (NORCO) 10-325 MG tablet, Take 1 tablet by mouth every 6 (six)  hours as needed for severe pain., Disp: 120 tablet, Rfl: 0 .  Ivermectin (SOOLANTRA) 1 % CREA, Apply 1 application topically as needed., Disp: , Rfl:  .  levothyroxine (SYNTHROID, LEVOTHROID) 150 MCG tablet, Take 150 mcg by mouth daily before breakfast., Disp: , Rfl:  .  Magnesium Oxide 500 MG CAPS, Take 1 capsule (500 mg total) by mouth daily., Disp: 90 capsule, Rfl: 0 .  meloxicam (MOBIC) 15 MG tablet, Take 15 mg by mouth daily., Disp: , Rfl:  .  potassium chloride (KLOR-CON) 8 MEQ tablet, Take 8 mEq by mouth daily., Disp: , Rfl:  .  prednisoLONE acetate (PRED FORTE) 1 % ophthalmic suspension, Place 1 drop into the right eye every 2 (two) hours while awake., Disp: , Rfl:  .  Wheat Dextrin (BENEFIBER) POWD, Stir 2 teaspoons of Benefiber into 4-8 oz of any non-carbonated beverage or soft food (hot or cold) TID., Disp: 500 g, Rfl: PRN .  [START ON 03/02/2018] HYDROcodone-acetaminophen (NORCO) 10-325 MG tablet, Take 1 tablet by mouth every 6 (six) hours as needed for severe pain., Disp: 120 tablet, Rfl: 0 .  [START ON 01/31/2018] HYDROcodone-acetaminophen (NORCO) 10-325 MG tablet, Take 1 tablet by mouth every 6 (six) hours as needed for severe pain., Disp: 120 tablet, Rfl: 0  ROS  Constitutional: Denies any fever or chills Gastrointestinal: No reported hemesis, hematochezia, vomiting, or acute GI distress Musculoskeletal: Denies any acute onset joint swelling, redness, loss of ROM, or weakness Neurological: No reported episodes of acute onset apraxia, aphasia, dysarthria, agnosia, amnesia, paralysis, loss of coordination, or loss of consciousness  Allergies  Ms. Mareno is allergic to penicillins; shellfish allergy; and sulfa antibiotics.  [place]  Drug: Ms. Marken  reports that she does not use drugs. Alcohol:  reports that she does not drink alcohol. Tobacco:  reports that she has never smoked. She has never used smokeless tobacco. Medical:  has a past medical history of History of Bell's palsy  (03/06/2015), History of Hashimoto thyroiditis (03/06/2015), History of nephrolithiasis (03/06/2015), Hypertension, Kidney stone, Macular corneal dystrophy (2005), Osteoarthritis, and Thyroid disease. Surgical: Ms. Spallone  has a past surgical history that includes Endometrial ablation; DILATATION AND CURETTAGE; Corneal transplant; and Corneal transplant. Family: family history includes Hypertension in her father and mother.  Constitutional Exam  General appearance: alert, cooperative, in no distress and morbidly obese Vitals:   01/21/18 0922  BP: (!) 157/99  Pulse: 96  Resp: 18  Temp: 98.2 F (36.8 C)  TempSrc: Oral  SpO2: 96%  Weight: 290 lb (131.5 kg)  Height: '5\' 3"'  (1.6 m)  Psych/Mental status: Alert, oriented x 3 (person, place, & time)       Eyes: PERLA Respiratory: No evidence of acute respiratory distress  Gait & Posture Assessment  Ambulation: Unassisted Gait: Relatively normal for age and body habitus Posture: WNL   Lower Extremity Exam    Side: Right lower extremity  Side: Left lower extremity  Stability: No instability observed          Stability: No instability observed          Skin & Extremity Inspection: Skin color, temperature, and hair growth are WNL. No peripheral edema or cyanosis. No masses, redness, swelling, asymmetry, or associated skin lesions. No contractures.  Skin & Extremity Inspection: Skin color, temperature, and hair growth are WNL. No peripheral edema or cyanosis. No masses, redness, swelling, asymmetry, or associated skin lesions. No contractures.  Functional ROM: Adequate ROM                  Functional ROM: Adequate ROM                  Muscle Tone/Strength: Functionally intact. No obvious neuro-muscular anomalies detected.  Muscle Tone/Strength: Functionally intact. No obvious neuro-muscular anomalies detected.  Sensory (Neurological): Unimpaired  Sensory (Neurological): Unimpaired  Palpation: No palpable anomalies  Palpation: No palpable  anomalies   Assessment  Primary Diagnosis & Pertinent Problem List: The primary encounter diagnosis was Chronic knee pain (Location of Primary Source of Pain) (Bilateral) (R>L). Diagnoses of Lumbar spondylosis, Chronic hip pain (Left), Musculoskeletal pain, Chronic pain syndrome, Long term current use of opiate analgesic, Vitamin D insufficiency, and Therapeutic opioid-induced constipation (OIC) were also pertinent to this visit.  Status Diagnosis  Persistent Controlled Controlled 1. Chronic knee pain (Location of Primary Source of Pain) (Bilateral) (R>L)   2. Lumbar spondylosis   3. Chronic hip pain (Left)   4. Musculoskeletal pain   5. Chronic pain syndrome   6. Long term current use of opiate analgesic   7. Vitamin D insufficiency   8. Therapeutic opioid-induced constipation (OIC)     Problems updated and reviewed during this visit: Problem  Hypothyroidism   Plan of Care  Pharmacotherapy (Medications Ordered): Meds ordered this encounter  Medications  . HYDROcodone-acetaminophen (NORCO) 10-325 MG tablet    Sig: Take 1 tablet by mouth every 6 (six) hours as needed for severe pain.    Dispense:  120 tablet    Refill:  0    Do not place this medication, or any other prescription from our practice, on "Automatic Refill". Patient may have prescription filled one day early if pharmacy is closed on scheduled refill date.    Order Specific Question:   Supervising Provider    Answer:   Milinda Pointer [phone]  . HYDROcodone-acetaminophen (NORCO) 10-325 MG tablet    Sig: Take 1 tablet by mouth every 6 (six) hours as needed for severe pain.    Dispense:  120 tablet    Refill:  0    Do not place this medication, or any other prescription from our practice, on "Automatic Refill". Patient may have prescription filled one day early if pharmacy is closed on scheduled refill date.    Order Specific Question:   Supervising Provider    Answer:   Milinda Pointer [phone]  .  HYDROcodone-acetaminophen (NORCO) 10-325 MG tablet    Sig: Take 1 tablet by mouth every 6 (six) hours as needed for severe pain.    Dispense:  120 tablet  Refill:  0    Do not place this medication, or any other prescription from our practice, on "Automatic Refill". Patient may have prescription filled one day early if pharmacy is closed on scheduled refill date.    Order Specific Question:   Supervising Provider    Answer:   Milinda Pointer [[id number]]   New Prescriptions   No medications on file   Medications administered today: Terre R. Schellinger had no medications administered during this visit. Lab-work, procedure(s), and/or referral(s): Orders Placed This Encounter  Procedures  . Drug Screen 10 W/Conf, Se   Imaging and/or referral(s): None Interventional therapies: Planned, scheduled, and/or pending:  Not at this time.   Considering:  None at this time. The patient's body habitus makes any interventional therapy technically challenging and decreases the chances of success. The patient has been informed that once her BMI goes down low 35, we will revisit and offer her other alternatives.   Palliative PRN treatment(s):  None at this time. The patient's body habitus makes any interventional therapy technically challenging and decreases the chances of success. The patient has been informed that once her BMI goes down low 35, we will revisit and offer her other alternatives.    Provider-requested follow-up: Return in about 3 months (around 04/23/2018) for MedMgmt.  Future Appointments  Date Time Provider [place]  04/27/2018  9:45 AM Alison Francois, NP [facility] None   Primary Care Physician: Alison Manes, MD Location: [facility] Outpatient Pain Management Facility Note by: Alison Francois NP Date: 01/21/2018; Time: 4:06 PM  Pain Score Disclaimer: We use the NRS-11 scale. This is a self-reported, subjective measurement of pain severity with only modest accuracy. It is  used primarily to identify changes within a particular patient. It must be understood that outpatient pain scales are significantly less accurate that those used for research, where they can be applied under ideal controlled circumstances with minimal exposure to variables. In reality, the score is likely to be a combination of pain intensity and pain affect, where pain affect describes the degree of emotional arousal or changes in action readiness caused by the sensory experience of pain. Factors such as social and work situation, setting, emotional state, anxiety levels, expectation, and prior pain experience may influence pain perception and show large inter-individual differences that may also be affected by time variables.  Patient instructions provided during this appointment: Patient Instructions   ____________________________________________________________________________________________  Medication Rules  Applies to: All patients receiving prescriptions (written or electronic).  Pharmacy of record: Pharmacy where electronic prescriptions will be sent. If written prescriptions are taken to a different pharmacy, please inform the nursing staff. The pharmacy listed in the electronic medical record should be the one where you would like electronic prescriptions to be sent.  Prescription refills: Only during scheduled appointments. Applies to both, written and electronic prescriptions.  NOTE: The following applies primarily to controlled substances (Opioid* Pain Medications).   Patient's responsibilities: 1. Pain Pills: Bring all pain pills to every appointment (except for procedure appointments). 2. Pill Bottles: Bring pills in original pharmacy bottle. Always bring newest bottle. Bring bottle, even if empty. 3. Medication refills: You are responsible for knowing and keeping track of what medications you need refilled. The day before your appointment, write a list of all prescriptions that  need to be refilled. Bring that list to your appointment and give it to the admitting nurse. Prescriptions will be written only during appointments. If you forget a medication, it will not be "Called in", "  Faxed", or "electronically sent". You will need to get another appointment to get these prescribed. 4. Prescription Accuracy: You are responsible for carefully inspecting your prescriptions before leaving our office. Have the discharge nurse carefully go over each prescription with you, before taking them home. Make sure that your name is accurately spelled, that your address is correct. Check the name and dose of your medication to make sure it is accurate. Check the number of pills, and the written instructions to make sure they are clear and accurate. Make sure that you are given enough medication to last until your next medication refill appointment. 5. Taking Medication: Take medication as prescribed. Never take more pills than instructed. Never take medication more frequently than prescribed. Taking less pills or less frequently is permitted and encouraged, when it comes to controlled substances (written prescriptions).  6. Inform other Doctors: Always inform, all of your healthcare providers, of all the medications you take. 7. Pain Medication from other Providers: You are not allowed to accept any additional pain medication from any other Doctor or Healthcare provider. There are two exceptions to this rule. (see below) In the event that you require additional pain medication, you are responsible for notifying us, as stated below. 8. Medication Agreement: You are responsible for carefully reading and following our Medication Agreement. This must be signed before receiving any prescriptions from our practice. Safely store a copy of your signed Agreement. Violations to the Agreement will result in no further prescriptions. (Additional copies of our Medication Agreement are available upon  request.) 9. Laws, Rules, & Regulations: All patients are expected to follow all Federal and [employer], [employer], Rules, [employer]. Ignorance of the Laws does not constitute a valid excuse. The use of any illegal substances is prohibited. 10. Adopted CDC guidelines & recommendations: Target dosing levels will be at or below 60 MME/day. Use of benzodiazepines** is not recommended.  Exceptions: There are only two exceptions to the rule of not receiving pain medications from other Healthcare Providers. 1. Exception #1 (Emergencies): In the event of an emergency (i.e.: accident requiring emergency care), you are allowed to receive additional pain medication. However, you are responsible for: As soon as you are able, call our office [phone], at any time of the day or night, and leave a message stating your name, the date and nature of the emergency, and the name and dose of the medication prescribed. In the event that your call is answered by a member of our staff, make sure to document and save the date, time, and the name of the person that took your information.  2. Exception #2 (Planned Surgery): In the event that you are scheduled by another doctor or dentist to have any type of surgery or procedure, you are allowed (for a period no longer than 30 days), to receive additional pain medication, for the acute post-op pain. However, in this case, you are responsible for picking up a copy of our "Post-op Pain Management for Surgeons" handout, and giving it to your surgeon or dentist. This document is available at our office, and does not require an appointment to obtain it. Simply go to our office during business hours (Monday-Thursday from 8:00 AM to 4:00 PM) (Friday 8:00 AM to 12:00 Noon) or if you have a scheduled appointment with [place], prior to your surgery, and ask for it by name. In addition, you will need to provide [place] with your name, name of your surgeon, type of surgery, and date of procedure  or  surgery.  *Opioid medications include: morphine, codeine, oxycodone, oxymorphone, hydrocodone, hydromorphone, meperidine, tramadol, tapentadol, buprenorphine, fentanyl, methadone. **Benzodiazepine medications include: diazepam (Valium), alprazolam (Xanax), clonazepam (Klonopine), lorazepam (Ativan), clorazepate (Tranxene), chlordiazepoxide (Librium), estazolam (Prosom), oxazepam (Serax), temazepam (Restoril), triazolam (Halcion) (Last updated: 06/18/2017) ____________________________________________________________________________________________    BMI Assessment: Estimated body mass index is 51.37 kg/m as calculated from the following:   Height as of this encounter: '5\' 3"'  (1.6 m).   Weight as of this encounter: 290 lb (131.5 kg).  BMI interpretation table: BMI level Category Range association with higher incidence of chronic pain  <18 kg/m2 Underweight   18.5-24.9 kg/m2 Ideal body weight   25-29.9 kg/m2 Overweight Increased incidence by 20%  30-34.9 kg/m2 Obese (Class I) Increased incidence by 68%  35-39.9 kg/m2 Severe obesity (Class II) Increased incidence by 136%  >40 kg/m2 Extreme obesity (Class III) Increased incidence by 254%   Patient's current BMI Ideal Body weight  Body mass index is 51.37 kg/m. Ideal body weight: 52.4 kg (115 lb 8.3 oz) Adjusted ideal body weight: 84.1 kg (185 lb 5 oz)   BMI Readings from Last 4 Encounters:  01/21/18 51.37 kg/m  10/21/17 53.14 kg/m  07/23/17 52.26 kg/m  04/27/17 53.14 kg/m   Wt Readings from Last 4 Encounters:  01/21/18 290 lb (131.5 kg)  10/21/17 300 lb (136.1 kg)  07/23/17 295 lb (133.8 kg)  04/27/17 300 lb (136.1 kg)  ALL SCRIPTS WERE E-SCRIBED TO YOUR PHARMACY.

## 2018-02-01 LAB — OPIATES,MS,WB/SP RFX
6-ACETYLMORPHINE: NEGATIVE
Codeine: NEGATIVE ng/mL
Dihydrocodeine: 4.2 ng/mL
HYDROCODONE: 22.2 ng/mL
Hydromorphone: NEGATIVE ng/mL
Morphine: NEGATIVE ng/mL
OPIATE CONFIRMATION: POSITIVE

## 2018-02-01 LAB — DRUG SCREEN 10 W/CONF, SERUM
Amphetamines, IA: NEGATIVE ng/mL
BARBITURATES, IA: NEGATIVE ug/mL
Benzodiazepines, IA: NEGATIVE ng/mL
Cocaine & Metabolite, IA: NEGATIVE ng/mL
METHADONE, IA: NEGATIVE ng/mL
Opiates, IA: POSITIVE ng/mL — AB
Oxycodones, IA: NEGATIVE ng/mL
PHENCYCLIDINE, IA: NEGATIVE ng/mL
PROPOXYPHENE, IA: NEGATIVE ng/mL
THC(Marijuana) Metabolite, IA: NEGATIVE ng/mL

## 2018-02-01 LAB — OXYCODONES,MS,WB/SP RFX
Oxycocone: NEGATIVE ng/mL
Oxycodones Confirmation: NEGATIVE
Oxymorphone: NEGATIVE ng/mL

## 2018-02-08 DIAGNOSIS — Z01419 Encounter for gynecological examination (general) (routine) without abnormal findings: Secondary | ICD-10-CM | POA: Diagnosis not present

## 2018-02-08 DIAGNOSIS — Z6841 Body Mass Index (BMI) 40.0 and over, adult: Secondary | ICD-10-CM | POA: Diagnosis not present

## 2018-04-12 DIAGNOSIS — R350 Frequency of micturition: Secondary | ICD-10-CM | POA: Diagnosis not present

## 2018-04-12 DIAGNOSIS — R109 Unspecified abdominal pain: Secondary | ICD-10-CM | POA: Diagnosis not present

## 2018-04-12 DIAGNOSIS — R300 Dysuria: Secondary | ICD-10-CM | POA: Diagnosis not present

## 2018-04-27 ENCOUNTER — Other Ambulatory Visit: Payer: Self-pay

## 2018-04-27 ENCOUNTER — Ambulatory Visit: Payer: 59 | Attending: Nurse Practitioner | Admitting: Nurse Practitioner

## 2018-04-27 ENCOUNTER — Encounter: Payer: Self-pay | Admitting: Nurse Practitioner

## 2018-04-27 VITALS — BP 140/54 | HR 105 | Temp 98.4°F | Resp 16 | Ht 63.5 in | Wt 295.0 lb

## 2018-04-27 DIAGNOSIS — G894 Chronic pain syndrome: Secondary | ICD-10-CM

## 2018-04-27 DIAGNOSIS — M47816 Spondylosis without myelopathy or radiculopathy, lumbar region: Secondary | ICD-10-CM

## 2018-04-27 DIAGNOSIS — Z79891 Long term (current) use of opiate analgesic: Secondary | ICD-10-CM

## 2018-04-27 DIAGNOSIS — G8929 Other chronic pain: Secondary | ICD-10-CM | POA: Diagnosis present

## 2018-04-27 DIAGNOSIS — M25552 Pain in left hip: Secondary | ICD-10-CM

## 2018-04-27 DIAGNOSIS — M170 Bilateral primary osteoarthritis of knee: Secondary | ICD-10-CM

## 2018-04-27 MED ORDER — HYDROCODONE-ACETAMINOPHEN 10-325 MG PO TABS: 1.0000 | ORAL_TABLET | Freq: Four times a day (QID) | ORAL | 0 refills | Status: DC | PRN

## 2018-04-27 NOTE — Progress Notes (Signed)
Patient's Name: Alison Cox  MRN: [id number]  Referring Provider: Lajean Manes, MD  DOB: Oct 27, 1955  PCP: Lajean Manes, MD  DOS: 04/27/2018  Note by: Vevelyn Francois NP  Service setting: Ambulatory outpatient  Specialty: Interventional Pain Management  Location: [facility] (AMB) Pain Management Facility    Patient type: Established    Primary Reason(s) for Visit: Encounter for prescription drug management. (Level of risk: moderate)  CC: Knee Pain (bilateral) and Ankle Pain (bilateral)  HPI  Alison Cox is a 63 y.o. year old, female patient, who comes today for a medication management evaluation. 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; Long term current use of opiate analgesic; Long term prescription opiate use; Opiate use (40 MME/Day); Encounter for therapeutic drug level monitoring; Opioid dependence ([place]); Lumbar spondylosis; Grade 1 Anterolisthesis of L4 over L5; Therapeutic opioid-induced constipation (OIC); Chronic knee pain (Location of Primary Source of Pain) (Bilateral) (R>L); Osteoarthritis of knees (Location of Primary Source of Pain) (Bilateral) (R>L); Vitamin D insufficiency; History of Bell's palsy (left-sided); History of nephrolithiasis; History of Hashimoto thyroiditis; Osteoarthrosis; Obesity, Class III, BMI 40-49.9 (morbid obesity) (HCC) (254% higher incidence of chronic low back pain); Spondylolysis of lumbosacral region (Advanced disc degeneration and spondylosis L5-S1); Osteoarthritis of ankles (Location of Secondary source of pain) (Bilateral) (L>R); Chronic low back pain (Location of Tertiary source of pain) (midline); Chronic hip pain (Left); Chronic pain syndrome; Musculoskeletal pain; Chronic ankle pain (Location of Secondary source of pain) (Bilateral) (L>R); Chronic autoimmune thyroiditis; Hypothyroidism due to Hashimoto's thyroiditis; Nontoxic thyroid nodule; and Knee pain on their problem list. Her  primarily concern today is the Knee Pain (bilateral) and Ankle Pain (bilateral)  Pain Assessment: Location: Right, Left Knee Onset: More than a month ago Duration: Chronic pain Quality: Aching, Constant Severity: 0-No pain(When sitting)/10 (subjective, self-reported pain score)  Note: Reported level is compatible with observation.                          Effect on ADL: walking on unlevel ground, bending, hiking,  Timing: Constant Modifying factors: rest, sitting, laying elevate feet BP: (!) 140/54  HR: (!) 105  Alison Cox was last scheduled for an appointment on 01/21/2018 for medication management. During today's appointment we reviewed Alison Cox's chronic pain status, as well as her outpatient medication regimen. She is doing well with sitting. She admits that she is going to move which is helping her pain.   The patient  reports no history of drug use. Her body mass index is 51.44 kg/m.  Further details on both, my assessment(s), as well as the proposed treatment plan, please see below.  Controlled Substance Pharmacotherapy Assessment REMS (Risk Evaluation and Mitigation Strategy)  Analgesic:Hydrocodone/APAP 10/325 one tablet by mouth every 6 hours (total: 40 mg/day) MME/day:40 mg/day Ignatius Specking, RN  04/27/2018 10:13 AM  Sign when Signing Visit Nursing Pain Medication Assessment:  Safety precautions to be maintained throughout the outpatient stay will include: orient to surroundings, keep bed in low position, maintain call bell within reach at all times, provide assistance with transfer out of bed and ambulation.  Medication Inspection Compliance: Pill count conducted under aseptic conditions, in front of the patient. Neither the pills nor the bottle was removed from the patient's sight at any time. Once count was completed pills were immediately returned to the patient in their original bottle.  Medication: Hydrocodone/APAP Pill/Patch Count: 45 of 120 pills  remain Pill/Patch  Appearance: Markings consistent with prescribed medication Bottle Appearance: Standard pharmacy container. Clearly labeled. Filled Date: 66 / 13 / 2019 Last Medication intake:  Today   Pharmacokinetics: Liberation and absorption (onset of action): WNL Distribution (time to peak effect): WNL Metabolism and excretion (duration of action): WNL         Pharmacodynamics: Desired effects: Analgesia: Alison Cox reports >50% benefit. Functional ability: Patient reports that medication allows her to accomplish basic ADLs Clinically meaningful improvement in function (CMIF): Sustained CMIF goals met Perceived effectiveness: Described as relatively effective, allowing for increase in activities of daily living (ADL) Undesirable effects: Side-effects or Adverse reactions: None reported Monitoring:  PMP: Online review of the past 41-monthperiod conducted. Compliant with practice rules and regulations Last UDS on record: Summary  Date Value Ref Range Status  10/14/2016 FINAL  Final    Comment:    ==================================================================== TOXASSURE SELECT 13 (MW) ==================================================================== Test                             Result       Flag       Units Drug Present and Declared for Prescription Verification   Hydrocodone                    1874         EXPECTED   ng/mg creat   Hydromorphone                  503          EXPECTED   ng/mg creat   Dihydrocodeine                 461          EXPECTED   ng/mg creat   Norhydrocodone                 1191         EXPECTED   ng/mg creat    Sources of hydrocodone include scheduled prescription    medications. Hydromorphone, dihydrocodeine and norhydrocodone are    expected metabolites of hydrocodone. Hydromorphone and    dihydrocodeine are also available as scheduled prescription     medications. ==================================================================== Test                      Result    Flag   Units      Ref Range   Creatinine              185              mg/dL      >=20 ==================================================================== Declared Medications:  The flagging and interpretation on this report are based on the  following declared medications.  Unexpected results may arise from  inaccuracies in the declared medications.  **Note: The testing scope of this panel includes these medications:  Hydrocodone (Hydrocodone-Acetaminophen)  **Note: The testing scope of this panel does not include following  reported medications:  Acetaminophen (Hydrocodone-Acetaminophen)  Amlodipine Besylate  Calcium Carbonate  Cholecalciferol  Doxycycline  Eye Drop  Hydrochlorothiazide  Levothyroxine  Magnesium Oxide  Meloxicam  Potassium  Prednisolone ==================================================================== For clinical consultation, please call [phone] ====================================================================    UDS interpretation: Compliant          Medication Assessment Form: Reviewed. Patient indicates being compliant with therapy Treatment compliance: Compliant Risk Assessment Profile: Aberrant behavior: See prior evaluations. None observed or detected  today Comorbid factors increasing risk of overdose: See prior notes. No additional risks detected today Opioid risk tool (ORT) (Total Score): 0 Personal History of Substance Abuse (SUD-Substance use disorder):  Alcohol: Negative  Illegal Drugs: Negative  Rx Drugs: Negative  ORT Risk Level calculation: Low Risk Risk of substance use disorder (SUD): Low Opioid Risk Tool - 04/27/18 1008      Family History of Substance Abuse   Alcohol  Negative    Illegal Drugs  Negative    Rx Drugs  Negative      Personal History of Substance Abuse   Alcohol  Negative    Illegal  Drugs  Negative    Rx Drugs  Negative      Psychological Disease   Psychological Disease  Negative    Depression  Negative      Total Score   Opioid Risk Tool Scoring  0    Opioid Risk Interpretation  Low Risk      ORT Scoring interpretation table:  Score <3 = Low Risk for SUD  Score between 4-7 = Moderate Risk for SUD  Score >8 = High Risk for Opioid Abuse   Risk Mitigation Strategies:  Patient Counseling: Covered Patient-Prescriber Agreement (PPA): Present and active  Notification to other healthcare providers: Done  Pharmacologic Plan: No change in therapy, at this time.             Laboratory Chemistry  Inflammation Markers (CRP: Acute Phase) (ESR: Chronic Phase) Lab Results  Component Value Date   CRP <0.8 05/01/2016   ESRSEDRATE 8 05/01/2016                         Rheumatology Markers No results found for: RF, ANA, LABURIC, URICUR, LYMEIGGIGMAB, LYMEABIGMQN, HLAB27                      Renal Function Markers Lab Results  Component Value Date   BUN 18 05/01/2016   CREATININE 0.82 05/01/2016   GFRAA >60 05/01/2016   GFRNONAA >60 05/01/2016                             Hepatic Function Markers Lab Results  Component Value Date   AST 37 05/01/2016   ALT 47 05/01/2016   ALBUMIN 4.6 05/01/2016   ALKPHOS 93 05/01/2016                        Electrolytes Lab Results  Component Value Date   NA 141 05/01/2016   K 4.0 05/01/2016   CL 106 05/01/2016   CALCIUM 10.0 05/01/2016   MG 2.0 05/01/2016                        Neuropathy Markers Lab Results  Component Value Date   FOYDXAJO87 867 05/01/2016                        CNS Tests No results found for: COLORCSF, APPEARCSF, RBCCOUNTCSF, WBCCSF, POLYSCSF, LYMPHSCSF, EOSCSF, PROTEINCSF, GLUCCSF, JCVIRUS, CSFOLI, IGGCSF                      Bone Pathology Markers Lab Results  Component Value Date   25OHVITD1 32 05/01/2016   25OHVITD2 2.2 05/01/2016   25OHVITD3 30 05/01/2016  Coagulation Parameters No results found for: INR, LABPROT, APTT, PLT, DDIMER, LABHEMA, VITAMINK1                      Cardiovascular Markers Lab Results  Component Value Date   HGB 15.6 (H) 10/11/2011   HCT 46.0 10/11/2011                         CA Markers No results found for: CEA, CA125, LABCA2                      Note: Lab results reviewed.  Recent Diagnostic Imaging Results  US THYROID CLINICAL DATA:  Follow-up thyroid nodules.  EXAM: THYROID ULTRASOUND  TECHNIQUE: Ultrasound examination of the thyroid gland and adjacent soft tissues was performed.  COMPARISON:  Thyroid ultrasound - 09/27/2013 ; 10/04/2012; 02/26/2012; ultrasound-guided right-sided thyroid nodule biopsy - 03/30/2012  FINDINGS: Grossly unchanged diffuse heterogeneity of the thyroid parenchymal echotexture and. No definitive new or enlarging thyroid nodules.  Right thyroid lobe  Measurements: Slightly diminutive in size measuring 3.2 x 1.4 x 2.0 cm, previously, 5.1 x 1.9 x 1.7 cm.  Right, mid, anterior - 1.2 x 1.0 x 2.0 cm - mixed echogenic, solid - grossly unchanged since the 02/2012 examination, previously, 2.0 x 0.9 x 1.4 cm. This nodule was previously biopsied.  Left thyroid lobe  Measurements: Slightly diminutive in size measuring 3.3 x 1.6 x 1.8 cm, unchanged, previously, 5.4 x 2.0 x 1.4 cm.  Left, superior - 0.5 cm - echogenic dystrophic calcification with posterior acoustic shadowing - unchanged since the 02/2012 examination  Isthmus  Thickness: Normal in size measuring 0.6 cm in diameter, unchanged.  There is mild diffuse heterogeneity of the thyroid isthmus without discrete nodule or mass.  Lymphadenopathy  None visualized.  IMPRESSION: 1. No new or enlarging thyroid nodules with all discretely measured thyroid nodules including previously biopsied dominant approximately 2 cm nodule within the right lobe of thyroid appearing unchanged since most remote examination  performed 02/2012. 2. Nonspecific apparent mild interval atrophy of both the right and left lobes of the thyroid with the right lobe now measuring 3.2 cm in maximal diameter (previously, 5.1 cm) and the left lobe measuring 3.3 cm (previously, 5.4 cm). Correlation with thyroid function tests is recommended.  Electronically Signed   By: Sandi Mariscal M.D.   On: 10/15/2015 15:26  Complexity Note: Imaging results reviewed. Results shared with Ms. [name], using Layman's terms.                         Meds   Current Outpatient Medications:  .  amLODipine (NORVASC) 5 MG tablet, Take 5 mg by mouth daily., Disp: , Rfl:  .  calcium carbonate (CALCIUM 600) 1500 (600 Ca) MG TABS tablet, Take by mouth 2 (two) times daily with a meal., Disp: , Rfl:  .  Cholecalciferol ([employer]S VITAMIN D) 2000 units CAPS, Take 1 capsule (2,000 Units total) by mouth daily., Disp: 30 each, Rfl: 6 .  Ciclopirox 1 % shampoo, Apply 1 application topically as needed., Disp: , Rfl: 6 .  Crisaborole (EUCRISA) 2 % OINT, Apply 1 application topically as needed., Disp: , Rfl:  .  Difluprednate (DUREZOL) 0.05 % EMUL, Place 1 drop into the right eye 2 (two) times daily., Disp: , Rfl:  .  doxycycline (DORYX) 100 MG DR capsule, Take 100 mg by mouth 2 (two) times daily., Disp: ,  Rfl:  .  Fluocinolone Acetonide 0.01 % OIL, Place 1 drop into both ears as needed., Disp: , Rfl:  .  hydrochlorothiazide (HYDRODIURIL) 25 MG tablet, Take 25 mg by mouth daily., Disp: , Rfl:  .  HYDROcodone-acetaminophen (NORCO) 10-325 MG tablet, Take 1 tablet by mouth every 6 (six) hours as needed for severe pain., Disp: 120 tablet, Rfl: 0 .  Ivermectin (SOOLANTRA) 1 % CREA, Apply 1 application topically as needed., Disp: , Rfl:  .  levothyroxine (SYNTHROID, LEVOTHROID) 150 MCG tablet, Take 150 mcg by mouth daily before breakfast., Disp: , Rfl:  .  meloxicam (MOBIC) 15 MG tablet, Take 15 mg by mouth daily., Disp: , Rfl:  .  potassium chloride (KLOR-CON) 8 MEQ  tablet, Take 8 mEq by mouth daily., Disp: , Rfl:  .  prednisoLONE acetate (PRED FORTE) 1 % ophthalmic suspension, Place 1 drop into the right eye every 2 (two) hours while awake., Disp: , Rfl:  .  Wheat Dextrin (BENEFIBER) POWD, Stir 2 teaspoons of Benefiber into 4-8 oz of any non-carbonated beverage or soft food (hot or cold) TID., Disp: 500 g, Rfl: PRN .  CVS MAGNESIUM OXIDE 500 MG TABS, TAKE 1 CAPSULE (500 MG TOTAL) BY MOUTH DAILY., Disp: 90 tablet, Rfl: 0 .  HYDROcodone-acetaminophen (NORCO) 10-325 MG tablet, Take 1 tablet by mouth every 6 (six) hours as needed for severe pain., Disp: 120 tablet, Rfl: 0 .  HYDROcodone-acetaminophen (NORCO) 10-325 MG tablet, Take 1 tablet by mouth every 6 (six) hours as needed for severe pain., Disp: 120 tablet, Rfl: 0 .  Magnesium Oxide 500 MG CAPS, Take 1 capsule (500 mg total) by mouth daily., Disp: 90 capsule, Rfl: 0  ROS  Constitutional: Denies any fever or chills Gastrointestinal: No reported hemesis, hematochezia, vomiting, or acute GI distress Musculoskeletal: Denies any acute onset joint swelling, redness, loss of ROM, or weakness Neurological: No reported episodes of acute onset apraxia, aphasia, dysarthria, agnosia, amnesia, paralysis, loss of coordination, or loss of consciousness  Allergies  Ms. Wann is allergic to penicillins; shellfish allergy; and sulfa antibiotics.  PFSH  Drug: Ms. Callan  reports no history of drug use. Alcohol:  reports no history of alcohol use. Tobacco:  reports that she has never smoked. She has never used smokeless tobacco. Medical:  has a past medical history of History of Bell's palsy (03/06/2015), History of Hashimoto thyroiditis (03/06/2015), History of nephrolithiasis (03/06/2015), Hypertension, Kidney stone, Macular corneal dystrophy (2005), Osteoarthritis, and Thyroid disease. Surgical: Ms. Hewes  has a past surgical history that includes Endometrial ablation; DILATATION AND CURETTAGE; Corneal transplant; and  Corneal transplant. Family: family history includes Hypertension in her father and mother.  Constitutional Exam  General appearance: Well nourished, well developed, and well hydrated. In no apparent acute distress Vitals:   04/27/18 0957  BP: (!) 140/54  Pulse: (!) 105  Resp: 16  Temp: 98.4 F (36.9 C)  SpO2: 96%  Weight: 295 lb (133.8 kg)  Height: 5' 3.5" (1.613 m)  Psych/Mental status: Alert, oriented x 3 (person, place, & time)       Eyes: PERLA Respiratory: No evidence of acute respiratory distress  Lumbar Spine Area Exam  Skin & Axial Inspection: No masses, redness, or swelling Alignment: Symmetrical Functional ROM: Unrestricted ROM       Stability: No instability detected Muscle Tone/Strength: Functionally intact. No obvious neuro-muscular anomalies detected. Sensory (Neurological): Unimpaired Palpation: No palpable anomalies       Provocative Tests: Hyperextension/rotation test: deferred today  Lumbar quadrant test (Kemp's test): deferred today       Lateral bending test: deferred today        Gait & Posture Assessment  Ambulation: Unassisted Gait: Relatively normal for age and body habitus Posture: WNL   Lower Extremity Exam    Side: Right lower extremity  Side: Left lower extremity  Stability: No instability observed          Stability: No instability observed          Skin & Extremity Inspection: Skin color, temperature, and hair growth are WNL. No peripheral edema or cyanosis. No masses, redness, swelling, asymmetry, or associated skin lesions. No contractures.  Skin & Extremity Inspection: Skin color, temperature, and hair growth are WNL. No peripheral edema or cyanosis. No masses, redness, swelling, asymmetry, or associated skin lesions. No contractures.  Functional ROM: Unrestricted ROM                  Functional ROM: Unrestricted ROM                  Muscle Tone/Strength: Functionally intact. No obvious neuro-muscular anomalies detected.  Muscle  Tone/Strength: Functionally intact. No obvious neuro-muscular anomalies detected.  Sensory (Neurological): Unimpaired        Sensory (Neurological): Unimpaired            Palpation: No palpable anomalies  Palpation: No palpable anomalies   Assessment  Primary Diagnosis & Pertinent Problem List: The primary encounter diagnosis was Primary osteoarthritis of both knees. Diagnoses of Lumbar spondylosis, Chronic hip pain (Left), Chronic pain syndrome, and Long term current use of opiate analgesic were also pertinent to this visit.  Status Diagnosis  Controlled Controlled Controlled 1. Primary osteoarthritis of both knees   2. Lumbar spondylosis   3. Chronic hip pain (Left)   4. Chronic pain syndrome   5. Long term current use of opiate analgesic     Problems updated and reviewed during this visit: No problems updated. Plan of Care  Pharmacotherapy (Medications Ordered): No orders of the defined types were placed in this encounter.  New Prescriptions   No medications on file   Medications administered today: Cherril R. Panning had no medications administered during this visit. Lab-work, procedure(s), and/or referral(s): No orders of the defined types were placed in this encounter.  Imaging and/or referral(s): None  Interventional therapies: Planned, scheduled, and/or pending:   Not at this time.   Provider-requested follow-up: Return in about 3 months (around 07/27/2018) for MedMgmt.  No future appointments. Primary Care Physician: Lajean Manes, MD Location: [facility] Outpatient Pain Management Facility Note by: Vevelyn Francois NP Date: 04/27/2018; Time: 10:21 AM  Pain Score Disclaimer: We use the NRS-11 scale. This is a self-reported, subjective measurement of pain severity with only modest accuracy. It is used primarily to identify changes within a particular patient. It must be understood that outpatient pain scales are significantly less accurate that those used for research, where  they can be applied under ideal controlled circumstances with minimal exposure to variables. In reality, the score is likely to be a combination of pain intensity and pain affect, where pain affect describes the degree of emotional arousal or changes in action readiness caused by the sensory experience of pain. Factors such as social and work situation, setting, emotional state, anxiety levels, expectation, and prior pain experience may influence pain perception and show large inter-individual differences that may also be affected by time variables.  Patient instructions provided during this appointment: Patient Instructions  ____________________________________________________________________________________________  Medication Rules  Purpose: To inform patients, and their family members, of our rules and regulations.  Applies to: All patients receiving prescriptions (written or electronic).  Pharmacy of record: Pharmacy where electronic prescriptions will be sent. If written prescriptions are taken to a different pharmacy, please inform the nursing staff. The pharmacy listed in the electronic medical record should be the one where you would like electronic prescriptions to be sent.  Electronic prescriptions: In compliance with the [place] (STOP) Act of 2017 (Session Lanny Cramp [phone]), effective April 21, 2018, all controlled substances must be electronically prescribed. Calling prescriptions to the pharmacy will cease to exist.  Prescription refills: Only during scheduled appointments. Applies to all prescriptions.  NOTE: The following applies primarily to controlled substances (Opioid* Pain Medications).   Patient's responsibilities: 1. Pain Pills: Bring all pain pills to every appointment (except for procedure appointments). 2. Pill Bottles: Bring pills in original pharmacy bottle. Always bring the newest bottle. Bring bottle, even if  empty. 3. Medication refills: You are responsible for knowing and keeping track of what medications you take and those you need refilled. The day before your appointment: write a list of all prescriptions that need to be refilled. The day of the appointment: give the list to the admitting nurse. Prescriptions will be written only during appointments. If you forget a medication: it will not be "Called in", "Faxed", or "electronically sent". You will need to get another appointment to get these prescribed. No early refills. Do not call asking to have your prescription filled early. 4. Prescription Accuracy: You are responsible for carefully inspecting your prescriptions before leaving our office. Have the discharge nurse carefully go over each prescription with you, before taking them home. Make sure that your name is accurately spelled, that your address is correct. Check the name and dose of your medication to make sure it is accurate. Check the number of pills, and the written instructions to make sure they are clear and accurate. Make sure that you are given enough medication to last until your next medication refill appointment. 5. Taking Medication: Take medication as prescribed. When it comes to controlled substances, taking less pills or less frequently than prescribed is permitted and encouraged. Never take more pills than instructed. Never take medication more frequently than prescribed.  6. Inform other Doctors: Always inform, all of your healthcare providers, of all the medications you take. 7. Pain Medication from other Providers: You are not allowed to accept any additional pain medication from any other Doctor or Healthcare provider. There are two exceptions to this rule. (see below) In the event that you require additional pain medication, you are responsible for notifying us, as stated below. 8. Medication Agreement: You are responsible for carefully reading and following our Medication  Agreement. This must be signed before receiving any prescriptions from our practice. Safely store a copy of your signed Agreement. Violations to the Agreement will result in no further prescriptions. (Additional copies of our Medication Agreement are available upon request.) 9. Laws, Rules, & Regulations: All patients are expected to follow all Federal and [employer], [employer], Rules, [employer]. Ignorance of the Laws does not constitute a valid excuse. The use of any illegal substances is prohibited. 10. Adopted CDC guidelines & recommendations: Target dosing levels will be at or below 60 MME/day. Use of benzodiazepines** is not recommended.  Exceptions: There are only two exceptions to the rule of not receiving pain medications from other Healthcare Providers. 1.  Exception #1 (Emergencies): In the event of an emergency (i.e.: accident requiring emergency care), you are allowed to receive additional pain medication. However, you are responsible for: As soon as you are able, call our office [phone], at any time of the day or night, and leave a message stating your name, the date and nature of the emergency, and the name and dose of the medication prescribed. In the event that your call is answered by a member of our staff, make sure to document and save the date, time, and the name of the person that took your information.  2. Exception #2 (Planned Surgery): In the event that you are scheduled by another doctor or dentist to have any type of surgery or procedure, you are allowed (for a period no longer than 30 days), to receive additional pain medication, for the acute post-op pain. However, in this case, you are responsible for picking up a copy of our "Post-op Pain Management for Surgeons" handout, and giving it to your surgeon or dentist. This document is available at our office, and does not require an appointment to obtain it. Simply go to our office during business hours (Monday-Thursday from  8:00 AM to 4:00 PM) (Friday 8:00 AM to 12:00 Noon) or if you have a scheduled appointment with [place], prior to your surgery, and ask for it by name. In addition, you will need to provide [place] with your name, name of your surgeon, type of surgery, and date of procedure or surgery.  *Opioid medications include: morphine, codeine, oxycodone, oxymorphone, hydrocodone, hydromorphone, meperidine, tramadol, tapentadol, buprenorphine, fentanyl, methadone. **Benzodiazepine medications include: diazepam (Valium), alprazolam (Xanax), clonazepam (Klonopine), lorazepam (Ativan), clorazepate (Tranxene), chlordiazepoxide (Librium), estazolam (Prosom), oxazepam (Serax), temazepam (Restoril), triazolam (Halcion) (Last updated: 06/18/2017) ____________________________________________________________________________________________    BMI Assessment: Estimated body mass index is 51.44 kg/m as calculated from the following:   Height as of this encounter: 5' 3.5" (1.613 m).   Weight as of this encounter: 295 lb (133.8 kg).  BMI interpretation table: BMI level Category Range association with higher incidence of chronic pain  <18 kg/m2 Underweight   18.5-24.9 kg/m2 Ideal body weight   25-29.9 kg/m2 Overweight Increased incidence by 20%  30-34.9 kg/m2 Obese (Class I) Increased incidence by 68%  35-39.9 kg/m2 Severe obesity (Class II) Increased incidence by 136%  >40 kg/m2 Extreme obesity (Class III) Increased incidence by 254%   Patient's current BMI Ideal Body weight  Body mass index is 51.44 kg/m. Ideal body weight: 53.5 kg (118 lb 0.9 oz) Adjusted ideal body weight: 85.7 kg (188 lb 13.3 oz)   BMI Readings from Last 4 Encounters:  04/27/18 51.44 kg/m  01/21/18 51.37 kg/m  10/21/17 53.14 kg/m  07/23/17 52.26 kg/m   Wt Readings from Last 4 Encounters:  04/27/18 295 lb (133.8 kg)  01/21/18 290 lb (131.5 kg)  10/21/17 300 lb (136.1 kg)  07/23/17 295 lb (133.8 kg)

## 2018-04-27 NOTE — Patient Instructions (Addendum)
____________________________________________________________________________________________  Medication Rules  Purpose: To inform patients, and their family members, of our rules and regulations.  Applies to: All patients receiving prescriptions (written or electronic).  Pharmacy of record: Pharmacy where electronic prescriptions will be sent. If written prescriptions are taken to a different pharmacy, please inform the nursing staff. The pharmacy listed in the electronic medical record should be the one where you would like electronic prescriptions to be sent.  Electronic prescriptions: In compliance with the [place] Strengthen Opioid Misuse Prevention (STOP) Act of 2017 (Session Law 2017-74/H243), effective April 21, 2018, all controlled substances must be electronically prescribed. Calling prescriptions to the pharmacy will cease to exist.  Prescription refills: Only during scheduled appointments. Applies to all prescriptions.  NOTE: The following applies primarily to controlled substances (Opioid* Pain Medications).   Patient's responsibilities: 1. Pain Pills: Bring all pain pills to every appointment (except for procedure appointments). 2. Pill Bottles: Bring pills in original pharmacy bottle. Always bring the newest bottle. Bring bottle, even if empty. 3. Medication refills: You are responsible for knowing and keeping track of what medications you take and those you need refilled. The day before your appointment: write a list of all prescriptions that need to be refilled. The day of the appointment: give the list to the admitting nurse. Prescriptions will be written only during appointments. If you forget a medication: it will not be "Called in", "Faxed", or "electronically sent". You will need to get another appointment to get these prescribed. No early refills. Do not call asking to have your prescription filled early. 4. Prescription Accuracy: You are responsible for  carefully inspecting your prescriptions before leaving our office. Have the discharge nurse carefully go over each prescription with you, before taking them home. Make sure that your name is accurately spelled, that your address is correct. Check the name and dose of your medication to make sure it is accurate. Check the number of pills, and the written instructions to make sure they are clear and accurate. Make sure that you are given enough medication to last until your next medication refill appointment. 5. Taking Medication: Take medication as prescribed. When it comes to controlled substances, taking less pills or less frequently than prescribed is permitted and encouraged. Never take more pills than instructed. Never take medication more frequently than prescribed.  6. Inform other Doctors: Always inform, all of your healthcare providers, of all the medications you take. 7. Pain Medication from other Providers: You are not allowed to accept any additional pain medication from any other Doctor or Healthcare provider. There are two exceptions to this rule. (see below) In the event that you require additional pain medication, you are responsible for notifying us, as stated below. 8. Medication Agreement: You are responsible for carefully reading and following our Medication Agreement. This must be signed before receiving any prescriptions from our practice. Safely store a copy of your signed Agreement. Violations to the Agreement will result in no further prescriptions. (Additional copies of our Medication Agreement are available upon request.) 9. Laws, Rules, & Regulations: All patients are expected to follow all Federal and State Laws, Statutes, Rules, & Regulations. Ignorance of the Laws does not constitute a valid excuse. The use of any illegal substances is prohibited. 10. Adopted CDC guidelines & recommendations: Target dosing levels will be at or below 60 MME/day. Use of benzodiazepines** is not  recommended.  Exceptions: There are only two exceptions to the rule of not receiving pain medications from other Healthcare Providers. 1.   Exception #1 (Emergencies): In the event of an emergency (i.e.: accident requiring emergency care), you are allowed to receive additional pain medication. However, you are responsible for: As soon as you are able, call our office [phone], at any time of the day or night, and leave a message stating your name, the date and nature of the emergency, and the name and dose of the medication prescribed. In the event that your call is answered by a member of our staff, make sure to document and save the date, time, and the name of the person that took your information.  2. Exception #2 (Planned Surgery): In the event that you are scheduled by another doctor or dentist to have any type of surgery or procedure, you are allowed (for a period no longer than 30 days), to receive additional pain medication, for the acute post-op pain. However, in this case, you are responsible for picking up a copy of our "Post-op Pain Management for Surgeons" handout, and giving it to your surgeon or dentist. This document is available at our office, and does not require an appointment to obtain it. Simply go to our office during business hours (Monday-Thursday from 8:00 AM to 4:00 PM) (Friday 8:00 AM to 12:00 Noon) or if you have a scheduled appointment with [place], prior to your surgery, and ask for it by name. In addition, you will need to provide [place] with your name, name of your surgeon, type of surgery, and date of procedure or surgery.  *Opioid medications include: morphine, codeine, oxycodone, oxymorphone, hydrocodone, hydromorphone, meperidine, tramadol, tapentadol, buprenorphine, fentanyl, methadone. **Benzodiazepine medications include: diazepam (Valium), alprazolam (Xanax), clonazepam (Klonopine), lorazepam (Ativan), clorazepate (Tranxene), chlordiazepoxide (Librium), estazolam (Prosom),  oxazepam (Serax), temazepam (Restoril), triazolam (Halcion) (Last updated: 06/18/2017) ____________________________________________________________________________________________    BMI Assessment: Estimated body mass index is 51.44 kg/m as calculated from the following:   Height as of this encounter: 5' 3.5" (1.613 m).   Weight as of this encounter: 295 lb (133.8 kg).  BMI interpretation table: BMI level Category Range association with higher incidence of chronic pain  <18 kg/m2 Underweight   18.5-24.9 kg/m2 Ideal body weight   25-29.9 kg/m2 Overweight Increased incidence by 20%  30-34.9 kg/m2 Obese (Class I) Increased incidence by 68%  35-39.9 kg/m2 Severe obesity (Class II) Increased incidence by 136%  >40 kg/m2 Extreme obesity (Class III) Increased incidence by 254%   Patient's current BMI Ideal Body weight  Body mass index is 51.44 kg/m. Ideal body weight: 53.5 kg (118 lb 0.9 oz) Adjusted ideal body weight: 85.7 kg (188 lb 13.3 oz)   BMI Readings from Last 4 Encounters:  04/27/18 51.44 kg/m  01/21/18 51.37 kg/m  10/21/17 53.14 kg/m  07/23/17 52.26 kg/m   Wt Readings from Last 4 Encounters:  04/27/18 295 lb (133.8 kg)  01/21/18 290 lb (131.5 kg)  10/21/17 300 lb (136.1 kg)  07/23/17 295 lb (133.8 kg)

## 2018-04-27 NOTE — Progress Notes (Signed)
Nursing Pain Medication Assessment:  Safety precautions to be maintained throughout the outpatient stay will include: orient to surroundings, keep bed in low position, maintain call bell within reach at all times, provide assistance with transfer out of bed and ambulation.  Medication Inspection Compliance: Pill count conducted under aseptic conditions, in front of the patient. Neither the pills nor the bottle was removed from the patient's sight at any time. Once count was completed pills were immediately returned to the patient in their original bottle.  Medication: Hydrocodone/APAP Pill/Patch Count: 45 of 120 pills remain Pill/Patch Appearance: Markings consistent with prescribed medication Bottle Appearance: Standard pharmacy container. Clearly labeled. Filled Date: 45 / 13 / 2019 Last Medication intake:  Today

## 2018-05-04 DIAGNOSIS — I10 Essential (primary) hypertension: Secondary | ICD-10-CM | POA: Diagnosis not present

## 2018-05-04 DIAGNOSIS — Z79899 Other long term (current) drug therapy: Secondary | ICD-10-CM | POA: Diagnosis not present

## 2018-07-26 ENCOUNTER — Encounter: Payer: Self-pay | Admitting: Nurse Practitioner

## 2018-07-26 ENCOUNTER — Ambulatory Visit: Payer: 59 | Attending: Nurse Practitioner | Admitting: Nurse Practitioner

## 2018-07-26 ENCOUNTER — Other Ambulatory Visit: Payer: Self-pay

## 2018-07-26 DIAGNOSIS — M25552 Pain in left hip: Secondary | ICD-10-CM

## 2018-07-26 DIAGNOSIS — M170 Bilateral primary osteoarthritis of knee: Secondary | ICD-10-CM | POA: Diagnosis not present

## 2018-07-26 DIAGNOSIS — G894 Chronic pain syndrome: Secondary | ICD-10-CM | POA: Diagnosis not present

## 2018-07-26 DIAGNOSIS — M47816 Spondylosis without myelopathy or radiculopathy, lumbar region: Secondary | ICD-10-CM | POA: Diagnosis not present

## 2018-07-26 DIAGNOSIS — G8929 Other chronic pain: Secondary | ICD-10-CM

## 2018-07-26 MED ORDER — HYDROCODONE-ACETAMINOPHEN 10-325 MG PO TABS: 1.0000 | ORAL_TABLET | Freq: Four times a day (QID) | ORAL | 0 refills | Status: DC | PRN

## 2018-07-26 NOTE — Progress Notes (Signed)
Pain Management Encounter Note - Virtual Visit via Telephone Telehealth (real-time audio visits between healthcare provider and patient).  Patient's Phone No. & Preferred Pharmacy:  [phone] (home); [phone] (mobile); (Preferred) [phone]  [id number] [address], [place] [address] Phone: [phone] Fax: [phone]   Pre-screening note:  Our staff contacted Alison Cox and offered her an "in person", "face-to-face" appointment versus a telephone encounter. She indicated preferring the telephone encounter, at this time.  Reason for Virtual Visit: COVID-19*  Social distancing based on CDC and AMA recommendations.   I contacted Alison Cox on 07/26/2018 at 10:41 AM by telephone and clearly identified myself as Dionisio David, NP. I verified that I was speaking with the correct person using two identifiers (Name and date of birth: 1956-04-09).  Advanced Informed Consent I sought verbal advanced consent from Alison Cox for telemedicine interactions and virtual visit. I informed Alison Cox of the security and privacy concerns, risks, and limitations associated with performing an evaluation and management service by telephone. I also informed Alison Cox of the availability of "in person" appointments and I informed her of the possibility of a patient responsible charge related to this service. Alison Cox expressed understanding and agreed to proceed.   Historic Elements   Alison Cox is a 63 y.o. year old, female patient evaluated today after her last encounter by our practice on 04/27/2018. Alison Cox  has a past medical history of History of Bell's palsy (03/06/2015), History of Hashimoto thyroiditis (03/06/2015), History of nephrolithiasis (03/06/2015), Hypertension, Kidney stone, Macular corneal dystrophy (2005), Osteoarthritis, and Thyroid disease. She also  has a past surgical history that includes Endometrial ablation; DILATATION  AND CURETTAGE; Corneal transplant; and Corneal transplant. Alison Cox has a current medication list which includes the following prescription(s): amlodipine, calcium carbonate, cholecalciferol, ciclopirox, crisaborole, [facility] magnesium oxide, difluprednate, doxycycline, fluocinolone acetonide, hydrochlorothiazide, hydrocodone-acetaminophen, hydrocodone-acetaminophen, hydrocodone-acetaminophen, ivermectin, levothyroxine, magnesium oxide, meloxicam, potassium chloride, prednisolone acetate, and benefiber. She  reports that she has never smoked. She has never used smokeless tobacco. She reports that she does not drink alcohol or use drugs. Alison Cox; Alison Cox.   HPI  I last saw her on 04/27/2018. She is being evaluated for medication management.  She rates 1/10. She has bilateral knee pain. She admits that her pain is stable. She denies any new concerns or questions related to her pain. She denies any side effects of her current medication. She did want to discuss the current pandemic and the concerns she has with everything in general.  Review of recent tests  US THYROID CLINICAL DATA:  Follow-up thyroid nodules.  EXAM: THYROID ULTRASOUND  TECHNIQUE: Ultrasound examination of the thyroid gland and adjacent soft tissues was performed.  COMPARISON:  Thyroid ultrasound - 09/27/2013 ; 10/04/2012; 02/26/2012; ultrasound-guided right-sided thyroid nodule biopsy - 03/30/2012  FINDINGS: Grossly unchanged diffuse heterogeneity of the thyroid parenchymal echotexture and. No definitive new or enlarging thyroid nodules.  Right thyroid lobe  Measurements: Slightly diminutive in size measuring 3.2 x 1.4 x 2.0 cm, previously, 5.1 x 1.9 x 1.7 cm.  Right, mid, anterior - 1.2 x 1.0 x 2.0 cm - mixed echogenic, solid - grossly unchanged since the 02/2012 examination, previously, 2.0 x 0.9 x 1.4 cm. This nodule was previously biopsied.  Left thyroid  lobe  Measurements: Slightly diminutive in size measuring 3.3 x 1.6 x 1.8 cm, unchanged, previously, 5.4 x 2.0 x 1.4 cm.  Left, superior - 0.5 cm - echogenic dystrophic calcification with posterior acoustic shadowing - unchanged since the 02/2012 examination  Isthmus  Thickness: Normal in size measuring 0.6 cm in diameter, unchanged.  There is mild diffuse heterogeneity of the thyroid isthmus without discrete nodule or mass.  Lymphadenopathy  None visualized.  IMPRESSION: 1. No new or enlarging thyroid nodules with all discretely measured thyroid nodules including previously biopsied dominant approximately 2 cm nodule within the right lobe of thyroid appearing unchanged since most remote examination performed 02/2012. 2. Nonspecific apparent mild interval atrophy of both the right and left lobes of the thyroid with the right lobe now measuring 3.2 cm in maximal diameter (previously, 5.1 cm) and the left lobe measuring 3.3 cm (previously, 5.4 cm). Correlation with thyroid function tests is recommended.  Electronically Signed   By: Sandi Mariscal M.D.   On: 10/15/2015 15:26   Clinical Support on 01/21/2018  Component Date Value Ref Range Status  . Amphetamines, IA 01/21/2018 Negative  Cutoff:50 ng/mL Final  . Barbiturates, IA 01/21/2018 Negative  Cutoff:0.1 ug/mL Final  . Benzodiazepines, IA 01/21/2018 Negative  Cutoff:20 ng/mL Final  . Cocaine & Metabolite, IA 01/21/2018 Negative  Cutoff:25 ng/mL Final  . Phencyclidine, IA 01/21/2018 Negative  Cutoff:8 ng/mL Final  . THC(Marijuana) Metabolite, IA 01/21/2018 Negative  Cutoff:5 ng/mL Final  . Opiates, IA 01/21/2018 ++POSITIVE++* Cutoff:5 ng/mL Final  . Oxycodones, IA 01/21/2018 Negative  Cutoff:5 ng/mL Final   Comment: Presumptive immunoassay result indicated need for further testing; definitive confirmation was negative.   . Methadone, IA 01/21/2018 Negative  Cutoff:25 ng/mL Final  . Propoxyphene, IA 01/21/2018 Negative   Cutoff:50 ng/mL Final   Comment: This test was developed and its performance characteristics determined by [facility].  It has not been cleared or approved by the Food and Drug Administration.   . Oxycodones Confirmation 01/21/2018 Negative   Final  . Oxycocone 01/21/2018 Negative  ng/mL Final  . Oxymorphone 01/21/2018 Negative  ng/mL Final   Confirmation threshold: 1.0 ng/mL  . Opiate Confirmation 01/21/2018 Positive   Final  . Codeine 01/21/2018 Negative  ng/mL Final  . Morphine 01/21/2018 Negative  ng/mL Final  . 6-Acetylmorphine 01/21/2018 Negative   Final  . Hydrocodone 01/21/2018 22.2  ng/mL Final  . Hydromorphone 01/21/2018 Negative  ng/mL Final  . Dihydrocodeine 01/21/2018 4.2  ng/mL Final   Comment: Expected metabolism of opiate class drugs:  Parent Drug       Detected Metabolites  -----------       --------------------  Codeine:          Major:  Morphine                    Minor:  Hydrocodone, Hydromorphone,                            Dihydrocodeine  Morphine:         Minor:  Hydromorphone  Hydrocodone:      Hydromorphone, Dihydrocodeine  Hydromorphone:    None  Dihydrocodeine:   None  Heroin:           6-Acetylmorphine                    Morphine                    Codeine, in small amounts in comparison  to morphine, is often detected when                     heroin is the source drug. Confirmation threshold: 1.0 ng/mL    Assessment  The primary encounter diagnosis was Primary osteoarthritis of both knees. Diagnoses of Chronic pain syndrome, Lumbar spondylosis, and Chronic hip pain (Left) were also pertinent to this visit.  Plan of Care  I have changed Alison Cox's HYDROcodone-acetaminophen, HYDROcodone-acetaminophen, and HYDROcodone-acetaminophen. I am also having her maintain her doxycycline, meloxicam, hydrochlorothiazide, levothyroxine, potassium chloride, prednisoLONE acetate, amLODipine, [facility] Magnesium Oxide, calcium carbonate, Magnesium  Oxide, Benefiber, Cholecalciferol, Ciclopirox, Crisaborole, Difluprednate, Fluocinolone Acetonide, and Ivermectin.  Pharmacotherapy (Medications Ordered): Meds ordered this encounter  Medications  . HYDROcodone-acetaminophen (NORCO) 10-325 MG tablet    Sig: Take 1 tablet by mouth every 6 (six) hours as needed for up to 30 days for severe pain.    Dispense:  120 tablet    Refill:  0    Do not place this medication, or any other prescription from our practice, on "Automatic Refill". Patient may have prescription filled one day early if pharmacy is closed on scheduled refill date.    Order Specific Question:   Supervising Provider    Answer:   Milinda Pointer [phone]  . HYDROcodone-acetaminophen (NORCO) 10-325 MG tablet    Sig: Take 1 tablet by mouth every 6 (six) hours as needed for up to 30 days for severe pain.    Dispense:  120 tablet    Refill:  0    Do not place this medication, or any other prescription from our practice, on "Automatic Refill". Patient may have prescription filled one day early if pharmacy is closed on scheduled refill date.    Order Specific Question:   Supervising Provider    Answer:   Milinda Pointer [phone]  . HYDROcodone-acetaminophen (NORCO) 10-325 MG tablet    Sig: Take 1 tablet by mouth every 6 (six) hours as needed for up to 30 days for severe pain.    Dispense:  120 tablet    Refill:  0    Do not place this medication, or any other prescription from our practice, on "Automatic Refill". Patient may have prescription filled one day early if pharmacy is closed on scheduled refill date.    Order Specific Question:   Supervising Provider    Answer:   Milinda Pointer [phone]   Orders:  No orders of the defined types were placed in this encounter.  Follow-up plan:   Return in about 3 months (around 10/25/2018) for MedMgmt.   I discussed the assessment and treatment plan with the patient. The patient was provided an opportunity to ask questions and  all were answered. The patient agreed with the plan and demonstrated an understanding of the instructions.  Patient advised to call back or seek an in-person evaluation if the symptoms or condition worsens.  Total duration of non-face-to-face encounter: 22 minutes.  Note by: Dionisio David, NP Date: 07/26/2018; Time: 11:54 AM  Disclaimer:  * Given the special circumstances of the COVID-19 pandemic, the federal government has announced that the Office for Civil Rights (OCR) will exercise its enforcement discretion and will not impose penalties on physicians using telehealth in the event of noncompliance with regulatory requirements under the [place] (HIPAA) in connection with the good faith provision of telehealth during the XNATF-57 national public health emergency. ([place])

## 2018-10-21 ENCOUNTER — Encounter: Payer: Self-pay | Admitting: Pain Medicine

## 2018-10-24 NOTE — Progress Notes (Signed)
Pain Management Virtual Encounter Note - Virtual Visit via Telephone Telehealth (real-time audio visits between healthcare provider and patient).   Patient's Phone No. & Preferred Pharmacy:  [phone] (home); [phone] (mobile); (Preferred) [phone] [email or web address]  [id number], [place] [address] [place] [address] Phone: [phone] Fax: [phone]    Pre-screening note:  Our staff contacted Ms. Pfister and offered her an "in person", "face-to-face" appointment versus a telephone encounter. She indicated preferring the telephone encounter, at this time.   Reason for Virtual Visit: COVID-19*  Social distancing based on CDC and AMA recommendations.   I contacted [name] on 10/25/2018 via telephone.      I clearly identified myself as Gaspar Cola, MD. I verified that I was speaking with the correct person using two identifiers (Name: ONISHA CEDENO, and date of birth: 09-29-1955).  Advanced Informed Consent I sought verbal advanced consent from [name] for virtual visit interactions. I informed Ms. Hornung of possible security and privacy concerns, risks, and limitations associated with providing "not-in-person" medical evaluation and management services. I also informed Ms. Royle of the availability of "in-person" appointments. Finally, I informed her that there would be a charge for the virtual visit and that she could be  personally, fully or partially, financially responsible for it. Ms. Marinaccio expressed understanding and agreed to proceed.   Historic Elements   Ms. TERESIA MYINT is a 63 y.o. year old, female patient evaluated today after her last encounter by our practice on 07/26/2018. Ms. Bothun  has a past medical history of History of Bell's palsy (03/06/2015), History of Hashimoto thyroiditis (03/06/2015), History of nephrolithiasis (03/06/2015), Hypertension, Kidney stone, Macular corneal dystrophy (2005),  Osteoarthritis, and Thyroid disease. She also  has a past surgical history that includes Endometrial ablation; DILATATION AND CURETTAGE; Corneal transplant; and Corneal transplant. Ms. Balthaser has a current medication list which includes the following prescription(s): amlodipine, calcium carbonate, cholecalciferol, ciclopirox, crisaborole, difluprednate, doxycycline hyclate, fluocinolone acetonide, hydrochlorothiazide, hydrocodone-acetaminophen, hydrocodone-acetaminophen, hydrocodone-acetaminophen, ivermectin, levothyroxine, magnesium oxide, meloxicam, nystatin-triamcinolone, potassium chloride cr, prednisolone acetate, and benefiber. She  reports that she has never smoked. She has never used smokeless tobacco. She reports that she does not drink alcohol or use drugs. Ms. Chim is allergic to penicillins; shellfish allergy; and sulfa antibiotics.   HPI  Today, she is being contacted for medication management.  The patient indicates having no side effects or adverse reactions to any of the medications.  In reviewing her chart, I have noticed that her last set of labs were done around 2018.  I will be ordering new ones today.  Pharmacotherapy Assessment  Analgesic: Hydrocodone/APAP 10/325 one tablet by mouth every 6 hours (total: 40 mg/day) MME/day:40 mg/day.   Monitoring: Pharmacotherapy: No side-effects or adverse reactions reported. [place] PMP: PDMP reviewed during this encounter.       Compliance: No problems identified. Effectiveness: Clinically acceptable. Plan: Refer to "POC".  Pertinent Labs   SAFETY SCREENING Profile No results found for: SARSCOV2NAA, COVIDSOURCE, STAPHAUREUS, MRSAPCR, HCVAB, HIV, PREGTESTUR Renal Function Lab Results  Component Value Date   BUN 18 05/01/2016   CREATININE 0.82 05/01/2016   GFRAA >60 05/01/2016   GFRNONAA >60 05/01/2016   Hepatic Function Lab Results  Component Value Date   AST 37 05/01/2016   ALT 47 05/01/2016   ALBUMIN 4.6 05/01/2016    UDS Summary  Date Value Ref Range Status  10/14/2016 FINAL  Final    Comment:    ==================================================================== TOXASSURE  SELECT 13 (MW) ==================================================================== Test                             Result       Flag       Units Drug Present and Declared for Prescription Verification   Hydrocodone                    1874         EXPECTED   ng/mg creat   Hydromorphone                  503          EXPECTED   ng/mg creat   Dihydrocodeine                 461          EXPECTED   ng/mg creat   Norhydrocodone                 1191         EXPECTED   ng/mg creat    Sources of hydrocodone include scheduled prescription    medications. Hydromorphone, dihydrocodeine and norhydrocodone are    expected metabolites of hydrocodone. Hydromorphone and    dihydrocodeine are also available as scheduled prescription    medications. ==================================================================== Test                      Result    Flag   Units      Ref Range   Creatinine              185              mg/dL      >=20 ==================================================================== Declared Medications:  The flagging and interpretation on this report are based on the  following declared medications.  Unexpected results may arise from  inaccuracies in the declared medications.  **Note: The testing scope of this panel includes these medications:  Hydrocodone (Hydrocodone-Acetaminophen)  **Note: The testing scope of this panel does not include following  reported medications:  Acetaminophen (Hydrocodone-Acetaminophen)  Amlodipine Besylate  Calcium Carbonate  Cholecalciferol  Doxycycline  Eye Drop  Hydrochlorothiazide  Levothyroxine  Magnesium Oxide  Meloxicam  Potassium  Prednisolone ==================================================================== For clinical consultation, please call [phone]. ====================================================================    Note: Above Lab results reviewed.  Recent imaging  US THYROID CLINICAL DATA:  Follow-up thyroid nodules.  EXAM: THYROID ULTRASOUND  TECHNIQUE: Ultrasound examination of the thyroid gland and adjacent soft tissues was performed.  COMPARISON:  Thyroid ultrasound - 09/27/2013 ; 10/04/2012; 02/26/2012; ultrasound-guided right-sided thyroid nodule biopsy - 03/30/2012  FINDINGS: Grossly unchanged diffuse heterogeneity of the thyroid parenchymal echotexture and. No definitive new or enlarging thyroid nodules.  Right thyroid lobe  Measurements: Slightly diminutive in size measuring 3.2 x 1.4 x 2.0 cm, previously, 5.1 x 1.9 x 1.7 cm.  Right, mid, anterior - 1.2 x 1.0 x 2.0 cm - mixed echogenic, solid - grossly unchanged since the 02/2012 examination, previously, 2.0 x 0.9 x 1.4 cm. This nodule was previously biopsied.  Left thyroid lobe  Measurements: Slightly diminutive in size measuring 3.3 x 1.6 x 1.8 cm, unchanged, previously, 5.4 x 2.0 x 1.4 cm.  Left, superior - 0.5 cm - echogenic dystrophic calcification with posterior acoustic shadowing - unchanged since the 02/2012 examination  Isthmus  Thickness: Normal in size measuring 0.6 cm in diameter, unchanged.  There  is mild diffuse heterogeneity of the thyroid isthmus without discrete nodule or mass.  Lymphadenopathy  None visualized.  IMPRESSION: 1. No new or enlarging thyroid nodules with all discretely measured thyroid nodules including previously biopsied dominant approximately 2 cm nodule within the right lobe of thyroid appearing unchanged since most remote examination performed 02/2012. 2. Nonspecific apparent mild interval atrophy of both the right and left lobes of the thyroid with the right lobe now measuring 3.2 cm in maximal diameter (previously, 5.1 cm) and the left lobe measuring 3.3 cm (previously, 5.4 cm).  Correlation with thyroid function tests is recommended.  Electronically Signed   By: Sandi Mariscal M.D.   On: 10/15/2015 15:26  Assessment  The primary encounter diagnosis was Chronic pain syndrome. Diagnoses of Chronic knee pain (Primary Area of Pain) (Bilateral) (R>L), Chronic ankle pain (Secondary area of Pain) (Bilateral) (L>R), Chronic low back pain (Third area of Pain) (midline), Musculoskeletal pain, Therapeutic opioid-induced constipation (OIC), Osteoarthritis involving multiple joints, Pharmacologic therapy, Disorder of skeletal system, and Problems influencing health status were also pertinent to this visit.  Plan of Care  I have discontinued Latarshia R. Syler's doxycycline, potassium chloride, Magnesium Oxide, HYDROcodone-acetaminophen, and HYDROcodone-acetaminophen. I have changed her [facility] Magnesium Oxide to Magnesium Oxide. I have also changed her HYDROcodone-acetaminophen and meloxicam. Additionally, I am having her start on HYDROcodone-acetaminophen and HYDROcodone-acetaminophen. Lastly, I am having her maintain her hydrochlorothiazide, levothyroxine, prednisoLONE acetate, amLODipine, calcium carbonate, Cholecalciferol, Ciclopirox, Crisaborole, Difluprednate, Fluocinolone Acetonide, Ivermectin, Doxycycline Hyclate, nystatin-triamcinolone, Potassium Chloride CR, and Benefiber.  Pharmacotherapy (Medications Ordered): Meds ordered this encounter  Medications  . Magnesium Oxide ([facility] MAGNESIUM OXIDE) 500 MG TABS    Sig: Take 1 tablet (500 mg total) by mouth daily.    Dispense:  90 tablet    Refill:  0    Fill one day early if pharmacy is closed on scheduled refill date. May substitute for generic if available.  [name] Kitchen HYDROcodone-acetaminophen (NORCO) 10-325 MG tablet    Sig: Take 1 tablet by mouth every 6 (six) hours as needed for severe pain. Must last 30 days    Dispense:  120 tablet    Refill:  0    Chronic Pain: STOP Act (Not applicable) Fill 1 day early if closed on refill date. Do not  fill until: 10/29/2018. To last until: 11/28/2018. Avoid benzodiazepines within 8 hours of opioids  . Wheat Dextrin (BENEFIBER) POWD    Sig: Stir 2 teaspoons of Benefiber into 4-8 oz of any non-carbonated beverage or soft food (hot or cold) TID.    Dispense:  500 g    Refill:  PRN    Fill one day early if pharmacy is closed on scheduled refill date. May substitute for generic if available.  . meloxicam (MOBIC) 15 MG tablet    Sig: Take 1 tablet (15 mg total) by mouth daily.    Dispense:  90 tablet    Refill:  0    Fill one day early if pharmacy is closed on scheduled refill date. May substitute for generic if available.  [name] Kitchen HYDROcodone-acetaminophen (NORCO) 10-325 MG tablet    Sig: Take 1 tablet by mouth every 6 (six) hours as needed for severe pain. Must last 30 days    Dispense:  120 tablet    Refill:  0    Chronic Pain: STOP Act (Not applicable) Fill 1 day early if closed on refill date. Do not fill until: 11/28/2018. To last until: 12/28/2018. Avoid benzodiazepines within 8 hours of opioids  .  HYDROcodone-acetaminophen (NORCO) 10-325 MG tablet    Sig: Take 1 tablet by mouth every 6 (six) hours as needed for severe pain. Must last 30 days    Dispense:  120 tablet    Refill:  0    Chronic Pain: STOP Act (Not applicable) Fill 1 day early if closed on refill date. Do not fill until: 12/28/2018. To last until: 01/27/2019. Avoid benzodiazepines within 8 hours of opioids   Orders:  Orders Placed This Encounter  Procedures  . ToxASSURE Select 13 (MW), Urine    Volume: 30 ml(s). Minimum 3 ml of urine is needed. Document temperature of fresh sample. Indications: Long term (current) use of opiate analgesic (Z79.891)  . Comp. Metabolic Panel (12)    With GFR. Indications: Chronic Pain Syndrome (G89.4) & Pharmacotherapy (Z56.387)    Order Specific Question:   Has the patient fasted?    Answer:   No    Order Specific Question:   CC Results    Answer:   PCP-NURSE [[id number]]  . Magnesium    Indication:  Pharmacologic therapy (R51.884)    Order Specific Question:   CC Results    Answer:   PCP-NURSE [[id number]]  . Vitamin B12    Indication: Pharmacologic therapy (K16.010).    Order Specific Question:   CC Results    Answer:   PCP-NURSE [[id number]]  . Sedimentation rate    Indication: Disorder of skeletal system (M89.9)    Order Specific Question:   CC Results    Answer:   PCP-NURSE [[id number]]  . 25-Hydroxyvitamin D Lcms D2+D3    Indication: Disorder of skeletal system (M89.9).    Order Specific Question:   CC Results    Answer:   PCP-NURSE [[id number]]  . C-reactive protein    Indication: Problems influencing health status (Z78.9)    Order Specific Question:   CC Results    Answer:   PCP-NURSE [[id number]]   Follow-up plan:   Return in about 3 months (around 01/26/2019) for (VV), E/M (MM).     Considering:   None at this time. The patient's body habitus makes any interventional therapy technically challenging and decreases the chances of success. The patient has been informed that once her BMI goes down low 35, we will revisit and offer her other alternatives.   Palliative PRN treatment(s):   None at this time.    Recent Visits No visits were found meeting these conditions.  Showing recent visits within past 90 days and meeting all other requirements   Today's Visits Date Type Provider Dept  10/25/18 Office Visit Milinda Pointer, MD [facility]  Showing today's visits and meeting all other requirements   Future Appointments No visits were found meeting these conditions.  Showing future appointments within next 90 days and meeting all other requirements   I discussed the assessment and treatment plan with the patient. The patient was provided an opportunity to ask questions and all were answered. The patient agreed with the plan and demonstrated an understanding of the instructions.  Patient advised to call back or seek an in-person evaluation if the symptoms or condition  worsens.  Total duration of non-face-to-face encounter: 17 minutes.  Note by: Gaspar Cola, MD Date: 10/25/2018; Time: 10:08 AM  Note: This dictation was prepared with Dragon dictation. Any transcriptional errors that may result from this process are unintentional.  Disclaimer:  * Given the special circumstances of the COVID-19 pandemic, the federal government has announced that the Office for Civil Rights (OCR) will  exercise its enforcement discretion and will not impose penalties on physicians using telehealth in the event of noncompliance with regulatory requirements under the [place] and Accountability Act (HIPAA) in connection with the good faith provision of telehealth during the QPRFF-63 national public health emergency. ([place])

## 2018-10-25 ENCOUNTER — Other Ambulatory Visit: Payer: Self-pay

## 2018-10-25 ENCOUNTER — Ambulatory Visit: Payer: 59 | Attending: Nurse Practitioner | Admitting: Pain Medicine

## 2018-10-25 DIAGNOSIS — G894 Chronic pain syndrome: Secondary | ICD-10-CM | POA: Diagnosis not present

## 2018-10-25 DIAGNOSIS — M899 Disorder of bone, unspecified: Secondary | ICD-10-CM

## 2018-10-25 DIAGNOSIS — M150 Primary generalized (osteo)arthritis: Secondary | ICD-10-CM

## 2018-10-25 DIAGNOSIS — M25571 Pain in right ankle and joints of right foot: Secondary | ICD-10-CM

## 2018-10-25 DIAGNOSIS — M7918 Myalgia, other site: Secondary | ICD-10-CM

## 2018-10-25 DIAGNOSIS — M159 Polyosteoarthritis, unspecified: Secondary | ICD-10-CM | POA: Insufficient documentation

## 2018-10-25 DIAGNOSIS — T402X5A Adverse effect of other opioids, initial encounter: Secondary | ICD-10-CM

## 2018-10-25 DIAGNOSIS — M25562 Pain in left knee: Secondary | ICD-10-CM

## 2018-10-25 DIAGNOSIS — G8929 Other chronic pain: Secondary | ICD-10-CM

## 2018-10-25 DIAGNOSIS — M25561 Pain in right knee: Secondary | ICD-10-CM

## 2018-10-25 DIAGNOSIS — M545 Low back pain, unspecified: Secondary | ICD-10-CM

## 2018-10-25 DIAGNOSIS — Z789 Other specified health status: Secondary | ICD-10-CM | POA: Insufficient documentation

## 2018-10-25 DIAGNOSIS — Z79899 Other long term (current) drug therapy: Secondary | ICD-10-CM

## 2018-10-25 DIAGNOSIS — K5903 Drug induced constipation: Secondary | ICD-10-CM

## 2018-10-25 DIAGNOSIS — M25572 Pain in left ankle and joints of left foot: Secondary | ICD-10-CM

## 2018-10-25 MED ORDER — HYDROCODONE-ACETAMINOPHEN 10-325 MG PO TABS: 1.0000 | ORAL_TABLET | Freq: Four times a day (QID) | ORAL | 0 refills | Status: DC | PRN

## 2018-10-25 MED ORDER — BENEFIBER PO POWD: ORAL | 99 refills | Status: DC

## 2018-10-25 MED ORDER — MELOXICAM 15 MG PO TABS: 15.0000 mg | ORAL_TABLET | Freq: Every day | ORAL | 0 refills | Status: DC

## 2018-10-25 MED ORDER — MAGNESIUM OXIDE -MG SUPPLEMENT 500 MG PO TABS: 500.0000 mg | ORAL_TABLET | Freq: Every day | ORAL | 0 refills | Status: DC

## 2019-01-16 ENCOUNTER — Other Ambulatory Visit: Payer: Self-pay | Admitting: Pain Medicine

## 2019-01-16 DIAGNOSIS — M159 Polyosteoarthritis, unspecified: Secondary | ICD-10-CM

## 2019-01-20 ENCOUNTER — Encounter: Payer: Self-pay | Admitting: Pain Medicine

## 2019-01-20 ENCOUNTER — Other Ambulatory Visit: Payer: Self-pay | Admitting: Pain Medicine

## 2019-01-20 DIAGNOSIS — M159 Polyosteoarthritis, unspecified: Secondary | ICD-10-CM

## 2019-01-20 LAB — TOXASSURE SELECT 13 (MW), URINE

## 2019-01-22 LAB — COMP. METABOLIC PANEL (12)
AST: 32 IU/L (ref 0–40)
Albumin/Globulin Ratio: 1.8 (ref 1.2–2.2)
Albumin: 4.8 g/dL (ref 3.8–4.8)
Alkaline Phosphatase: 112 IU/L (ref 39–117)
BUN/Creatinine Ratio: 14 (ref 12–28)
BUN: 14 mg/dL (ref 8–27)
Bilirubin Total: 0.5 mg/dL (ref 0.0–1.2)
Calcium: 10.5 mg/dL — ABNORMAL HIGH (ref 8.7–10.3)
Chloride: 100 mmol/L (ref 96–106)
Creatinine, Ser: 1 mg/dL (ref 0.57–1.00)
GFR calc Af Amer: 70 mL/min/{1.73_m2} (ref >59)
GFR calc non Af Amer: 61 mL/min/{1.73_m2} (ref >59)
Globulin, Total: 2.7 g/dL (ref 1.5–4.5)
Glucose: 87 mg/dL (ref 65–99)
Potassium: 4.2 mmol/L (ref 3.5–5.2)
Sodium: 141 mmol/L (ref 134–144)
Total Protein: 7.5 g/dL (ref 6.0–8.5)

## 2019-01-22 LAB — SEDIMENTATION RATE: Sed Rate: 39 mm/hr (ref 0–40)

## 2019-01-22 LAB — 25-HYDROXY VITAMIN D LCMS D2+D3
25-Hydroxy, Vitamin D-2: <1 ng/mL
25-Hydroxy, Vitamin D-3: 35 ng/mL
25-Hydroxy, Vitamin D: 36 ng/mL

## 2019-01-22 LAB — MAGNESIUM: Magnesium: 2.1 mg/dL (ref 1.6–2.3)

## 2019-01-22 LAB — C-REACTIVE PROTEIN: CRP: 11 mg/L — ABNORMAL HIGH (ref 0–10)

## 2019-01-22 LAB — VITAMIN B12: Vitamin B-12: 498 pg/mL (ref 232–1245)

## 2019-01-24 ENCOUNTER — Telehealth: Payer: Self-pay | Admitting: *Deleted

## 2019-01-24 ENCOUNTER — Other Ambulatory Visit: Payer: Self-pay

## 2019-01-24 ENCOUNTER — Ambulatory Visit: Payer: 59 | Attending: Pain Medicine | Admitting: Pain Medicine

## 2019-01-24 DIAGNOSIS — M25561 Pain in right knee: Secondary | ICD-10-CM

## 2019-01-24 DIAGNOSIS — M7918 Myalgia, other site: Secondary | ICD-10-CM

## 2019-01-24 DIAGNOSIS — M25571 Pain in right ankle and joints of right foot: Secondary | ICD-10-CM

## 2019-01-24 DIAGNOSIS — M545 Low back pain, unspecified: Secondary | ICD-10-CM

## 2019-01-24 DIAGNOSIS — M25562 Pain in left knee: Secondary | ICD-10-CM

## 2019-01-24 DIAGNOSIS — K5903 Drug induced constipation: Secondary | ICD-10-CM

## 2019-01-24 DIAGNOSIS — M8949 Other hypertrophic osteoarthropathy, multiple sites: Secondary | ICD-10-CM

## 2019-01-24 DIAGNOSIS — G8929 Other chronic pain: Secondary | ICD-10-CM

## 2019-01-24 DIAGNOSIS — G894 Chronic pain syndrome: Secondary | ICD-10-CM

## 2019-01-24 DIAGNOSIS — M159 Polyosteoarthritis, unspecified: Secondary | ICD-10-CM

## 2019-01-24 DIAGNOSIS — E559 Vitamin D deficiency, unspecified: Secondary | ICD-10-CM

## 2019-01-24 DIAGNOSIS — T402X5A Adverse effect of other opioids, initial encounter: Secondary | ICD-10-CM

## 2019-01-24 DIAGNOSIS — M25572 Pain in left ankle and joints of left foot: Secondary | ICD-10-CM

## 2019-01-24 DIAGNOSIS — M150 Primary generalized (osteo)arthritis: Secondary | ICD-10-CM

## 2019-01-24 MED ORDER — HYDROCODONE-ACETAMINOPHEN 10-325 MG PO TABS: 1.0000 | ORAL_TABLET | Freq: Four times a day (QID) | ORAL | 0 refills | Status: DC | PRN

## 2019-01-24 MED ORDER — MAGNESIUM OXIDE -MG SUPPLEMENT 500 MG PO TABS: 500.0000 mg | ORAL_TABLET | Freq: Every day | ORAL | 3 refills | Status: AC

## 2019-01-24 MED ORDER — CHOLECALCIFEROL 50 MCG (2000 UT) PO CAPS: 2000.0000 [IU] | ORAL_CAPSULE | Freq: Every day | ORAL | 11 refills | Status: AC

## 2019-01-24 MED ORDER — MELOXICAM 15 MG PO TABS: 15.0000 mg | ORAL_TABLET | Freq: Every day | ORAL | 0 refills | Status: DC

## 2019-01-24 NOTE — Progress Notes (Signed)
Pain Management Virtual Encounter Note - Virtual Visit via Telephone Telehealth (real-time audio visits between healthcare provider and patient).   Patient's Phone No. & Preferred Pharmacy:  [phone] (home); [phone] (mobile); (Preferred) [phone] [email or web address]  [id number] Altha Harm, [place] [address] [place] [address] Phone: [phone] Fax: [phone]    Pre-screening note:  Our staff contacted Alison Cox and offered her an "in person", "face-to-face" appointment versus a telephone encounter. She indicated preferring the telephone encounter, at this time.   Reason for Virtual Visit: COVID-19*  Social distancing based on CDC and AMA recommendations.   I contacted Alison Cox on 01/24/2019 via telephone.      I clearly identified myself as Gaspar Cola, MD. I verified that I was speaking with the correct person using two identifiers (Name: Alison Cox, and date of birth: 02/23/1956).  Advanced Informed Consent I sought verbal advanced consent from Alison Cox for virtual visit interactions. I informed Alison Cox of possible security and privacy concerns, risks, and limitations associated with providing "not-in-person" medical evaluation and management services. I also informed Alison Cox of the availability of "in-person" appointments. Finally, I informed her that there would be a charge for the virtual visit and that she could be  personally, fully or partially, financially responsible for it. Alison Cox expressed understanding and agreed to proceed.   Historic Elements   Alison Cox is a 63 y.o. year old, female patient evaluated today after her last encounter by our practice on 01/20/2019. Alison Cox  has a past medical history of History of Bell's palsy (03/06/2015), History of Hashimoto thyroiditis (03/06/2015), History of nephrolithiasis (03/06/2015), Hypertension, Kidney stone, Macular corneal dystrophy (2005),  Osteoarthritis, and Thyroid disease. She also  has a past surgical history that includes Endometrial ablation; DILATATION AND CURETTAGE; Corneal transplant; and Corneal transplant. Alison Cox has a current medication list which includes the following prescription(s): amlodipine, calcium carbonate, cholecalciferol, ciclopirox, crisaborole, difluprednate, doxycycline hyclate, fluocinolone acetonide, hydrochlorothiazide, hydrocodone-acetaminophen, hydrocodone-acetaminophen, hydrocodone-acetaminophen, ivermectin, levothyroxine, magnesium oxide, meloxicam, nystatin-triamcinolone, potassium chloride cr, prednisolone acetate, and benefiber. She  reports that she has never smoked. She has never used smokeless tobacco. She reports that she does not drink alcohol or use drugs. Alison Cox is allergic to penicillins; shellfish allergy; and sulfa antibiotics.   HPI  Today, she is being contacted for medication management.  The patient indicates doing well with the current medication regimen. No adverse reactions or side effects reported to the medications.  Although she is doing well, today she took the opportunity to ask me several questions about COVID-19 and whether or not she should be tested since her son, apparently was exposed to someone with [place].  The patient indicated having absolutely no symptoms and I recommended for her to contact her primary care physician just to make sure what the current protocol is.  Pharmacotherapy Assessment  Analgesic: Hydrocodone/APAP 10/325 one tablet by mouth every 6 hours (total: 40 mg/day) MME/day:40 mg/day.   Monitoring: Pharmacotherapy: No side-effects or adverse reactions reported.  PMP: PDMP reviewed during this encounter.       Compliance: No problems identified. Effectiveness: Clinically acceptable. Plan: Refer to "POC".  UDS:  Summary  Date Value Ref Range Status  01/17/2019 Note  Final    Comment:     ==================================================================== ToxASSURE Select 13 (MW) ==================================================================== Test  Result       Flag       Units Drug Present and Declared for Prescription Verification   Hydrocodone                    2134         EXPECTED   ng/mg creat   Hydromorphone                  443          EXPECTED   ng/mg creat   Dihydrocodeine                 517          EXPECTED   ng/mg creat   Norhydrocodone                 1312         EXPECTED   ng/mg creat    Sources of hydrocodone include scheduled prescription medications.    Hydromorphone, dihydrocodeine and norhydrocodone are expected    metabolites of hydrocodone. Hydromorphone and dihydrocodeine are    also available as scheduled prescription medications. ==================================================================== Test                      Result    Flag   Units      Ref Range   Creatinine              265              mg/dL      >=20 ==================================================================== Declared Medications:  The flagging and interpretation on this report are based on the  following declared medications.  Unexpected results may arise from  inaccuracies in the declared medications.  **Note: The testing scope of this panel includes these medications:  Hydrocodone (Norco)  **Note: The testing scope of this panel does not include the  following reported medications:  Acetaminophen (Norco)  Amlodipine  Calcium  Cholecalciferol  Ciclopirox  Doxycycline  Eye Drops  Fluocinolone  Hydrochlorothiazide  Levothyroxine (Synthroid)  Magnesium (Mag-Ox)  Meloxicam  Potassium  Supplement  Topical ==================================================================== For clinical consultation, please call [phone]. ====================================================================    Laboratory Chemistry  Profile (12 mo)  Renal: 01/17/2019: BUN 14; BUN/Creatinine Ratio 14; Creatinine, Ser 1.00  Lab Results  Component Value Date   GFRAA 70 01/17/2019   GFRNONAA 61 01/17/2019   Hepatic: 01/17/2019: Albumin 4.8 Lab Results  Component Value Date   AST 32 01/17/2019   ALT 47 05/01/2016   Other: 01/17/2019: 25-Hydroxy, Vitamin D 36; 25-Hydroxy, Vitamin D-2 <1.0; 25-Hydroxy, Vitamin D-3 35; CRP 11; Sed Rate 39; Vitamin B-12 498 Note: Above Lab results reviewed.  Imaging  Last 90 days:  No results found.  Assessment  The primary encounter diagnosis was Chronic pain syndrome. Diagnoses of Chronic knee pain (Primary Area of Pain) (Bilateral) (R>L), Chronic ankle pain (Secondary area of Pain) (Bilateral) (L>R), Chronic low back pain (Third area of Pain) (midline), Vitamin D insufficiency, Therapeutic opioid-induced constipation (OIC), Osteoarthritis involving multiple joints, and Musculoskeletal pain were also pertinent to this visit.  Plan of Care  I have changed Alison Cox's Cholecalciferol. I am also having her start on HYDROcodone-acetaminophen and HYDROcodone-acetaminophen. Additionally, I am having her maintain her hydrochlorothiazide, levothyroxine, prednisoLONE acetate, amLODipine, calcium carbonate, Ciclopirox, Crisaborole, Difluprednate, Fluocinolone Acetonide, Ivermectin, Doxycycline Hyclate, nystatin-triamcinolone, Potassium Chloride CR, Benefiber, meloxicam, Magnesium Oxide, and HYDROcodone-acetaminophen.  Pharmacotherapy (Medications Ordered): Meds ordered this encounter  Medications  .  Cholecalciferol ([facility] VITAMIN D) 50 MCG (2000 UT) CAPS    Sig: Take 1 capsule (2,000 Units total) by mouth daily.    Dispense:  30 capsule    Refill:  11    Fill one day early if pharmacy is closed on scheduled refill date. May substitute for generic if available.  . meloxicam (MOBIC) 15 MG tablet    Sig: Take 1 tablet (15 mg total) by mouth daily.    Dispense:  90 tablet    Refill:  0    Fill  one day early if pharmacy is closed on scheduled refill date. May substitute for generic if available.  . Magnesium Oxide ([facility] MAGNESIUM OXIDE) 500 MG TABS    Sig: Take 1 tablet (500 mg total) by mouth daily.    Dispense:  90 tablet    Refill:  3    Fill one day early if pharmacy is closed on scheduled refill date. May substitute for generic if available.  [name] Kitchen HYDROcodone-acetaminophen (NORCO) 10-325 MG tablet    Sig: Take 1 tablet by mouth every 6 (six) hours as needed for severe pain. Must last 30 days    Dispense:  120 tablet    Refill:  0    Chronic Pain: STOP Act (Not applicable) Fill 1 day early if closed on refill date. Do not fill until: 01/27/2019. To last until: 02/26/2019. Avoid benzodiazepines within 8 hours of opioids  . HYDROcodone-acetaminophen (NORCO) 10-325 MG tablet    Sig: Take 1 tablet by mouth every 6 (six) hours as needed for severe pain. Must last 30 days    Dispense:  120 tablet    Refill:  0    Chronic Pain: STOP Act (Not applicable) Fill 1 day early if closed on refill date. Do not fill until: 02/26/2019. To last until: 03/28/2019. Avoid benzodiazepines within 8 hours of opioids  . HYDROcodone-acetaminophen (NORCO) 10-325 MG tablet    Sig: Take 1 tablet by mouth every 6 (six) hours as needed for severe pain. Must last 30 days    Dispense:  120 tablet    Refill:  0    Chronic Pain: STOP Act (Not applicable) Fill 1 day early if closed on refill date. Do not fill until: 03/28/2019. To last until: 04/27/2019. Avoid benzodiazepines within 8 hours of opioids   Orders:  No orders of the defined types were placed in this encounter.  Follow-up plan:   Return in about 3 months (around 04/27/2019) for (VV), (MM).     Considering:   None at this time. The patient's body habitus makes any interventional therapy technically challenging and decreases the chances of success. The patient has been informed that once her BMI goes down low 35, we will revisit and offer her other alternatives.    Palliative PRN treatment(s):   None at this time.    Recent Visits No visits were found meeting these conditions.  Showing recent visits within past 90 days and meeting all other requirements   Today's Visits Date Type Provider Dept  01/24/19 Office Visit Milinda Pointer, MD [facility]  Showing today's visits and meeting all other requirements   Future Appointments No visits were found meeting these conditions.  Showing future appointments within next 90 days and meeting all other requirements   I discussed the assessment and treatment plan with the patient. The patient was provided an opportunity to ask questions and all were answered. The patient agreed with the plan and demonstrated an understanding of the instructions.  Patient advised to call back or seek an in-person evaluation if the symptoms or condition worsens.  Total duration of non-face-to-face encounter: 18 minutes.  Note by: Gaspar Cola, MD Date: 01/24/2019; Time: 11:11 AM  Note: This dictation was prepared with Dragon dictation. Any transcriptional errors that may result from this process are unintentional.  Disclaimer:  * Given the special circumstances of the COVID-19 pandemic, the federal government has announced that the Office for Civil Rights (OCR) will exercise its enforcement discretion and will not impose penalties on physicians using telehealth in the event of noncompliance with regulatory requirements under the [place] (HIPAA) in connection with the good faith provision of telehealth during the XX123456 national public health emergency. ([place])

## 2019-04-12 ENCOUNTER — Encounter: Payer: Self-pay | Admitting: Pain Medicine

## 2019-04-17 NOTE — Progress Notes (Signed)
Virtual Encounter - Pain Management PROVIDER NOTE: Information contained herein reflects review and annotations entered in association with encounter. Interpretation of such information and data should be left to medically-trained personnel. Information provided to patient can be located elsewhere in the medical record under "Patient Instructions". Document created using STT-dictation technology, any transcriptional errors that may result from process are unintentional.    Contact & Pharmacy Preferred: [phone] Home: [phone] (home) Mobile: [phone] (mobile) E-mail: [email or web address]  [id number] Alison Cox, [address] [address] Phone: [phone] Fax: [phone]   Pre-screening  Alison Cox offered "in-person" vs "virtual" encounter. She indicated preferring virtual for this encounter.   Reason COVID-19*  Social distancing based on CDC and AMA recommendations.   I contacted Alison Cox on 04/18/2019 via telephone.      I clearly identified myself as Gaspar Cola, MD. I verified that I was speaking with the correct person using two identifiers (Name: Alison Cox, and date of birth: [birth year]).  Consent I sought verbal advanced consent from Alison Cox for virtual visit interactions. I informed Alison Cox of possible security and privacy concerns, risks, and limitations associated with providing "not-in-person" medical evaluation and management services. I also informed Alison Cox of the availability of "in-person" appointments. Finally, I informed her that there would be a charge for the virtual visit and that she could be  personally, fully or partially, financially responsible for it. Alison Cox expressed understanding and agreed to proceed.   Historic Elements   Alison Cox is a 63 y.o. year old, female patient evaluated today after her last encounter by our practice on 01/24/2019. Alison Cox  has a past  medical history of History of Bell's palsy (03/06/2015), History of Hashimoto thyroiditis (03/06/2015), History of nephrolithiasis (03/06/2015), Hypertension, Kidney stone, Macular corneal dystrophy (2005), Osteoarthritis, and Thyroid disease. She also  has a past surgical history that includes Endometrial ablation; DILATATION AND CURETTAGE; Corneal transplant; and Corneal transplant. Ms. Soon has a current medication list which includes the following prescription(s): amlodipine, calcium carbonate, cholecalciferol, ciclopirox, crisaborole, difluprednate, doxycycline hyclate, fluocinolone acetonide, hydrochlorothiazide, [START ON 04/27/2019] hydrocodone-acetaminophen, [START ON 05/27/2019] hydrocodone-acetaminophen, [START ON 06/26/2019] hydrocodone-acetaminophen, ivermectin, levothyroxine, magnesium oxide, [START ON 04/27/2019] meloxicam, nystatin-triamcinolone, potassium chloride cr, prednisolone acetate, and benefiber. She  reports that she has never smoked. She has never used smokeless tobacco. She reports that she does not drink alcohol or use drugs. Alison Cox is allergic to penicillins; shellfish allergy; and sulfa antibiotics.   HPI  Today, she is being contacted for medication management. The patient indicates doing well with the current medication regimen. No adverse reactions or side effects reported to the medications.   Pharmacotherapy Assessment  Analgesic: Hydrocodone/APAP 10/325 one tablet by mouth every 6 hours (total: 40 mg/day) MME/day:40 mg/day.   Monitoring: Pharmacotherapy: No side-effects or adverse reactions reported. [place] PMP: PDMP reviewed during this encounter.       Compliance: No problems identified. Effectiveness: Clinically acceptable. Plan: Refer to "POC".  UDS:  Summary  Date Value Ref Range Status  01/17/2019 Note  Final    Comment:    ==================================================================== ToxASSURE Select 13  (MW) ==================================================================== Test                             Result       Flag       Units Drug Present and Declared for Prescription Verification   Hydrocodone  2134         EXPECTED   ng/mg creat   Hydromorphone                  443          EXPECTED   ng/mg creat   Dihydrocodeine                 517          EXPECTED   ng/mg creat   Norhydrocodone                 1312         EXPECTED   ng/mg creat    Sources of hydrocodone include scheduled prescription medications.    Hydromorphone, dihydrocodeine and norhydrocodone are expected    metabolites of hydrocodone. Hydromorphone and dihydrocodeine are    also available as scheduled prescription medications. ==================================================================== Test                      Result    Flag   Units      Ref Range   Creatinine              265              mg/dL      >=20 ==================================================================== Declared Medications:  The flagging and interpretation on this report are based on the  following declared medications.  Unexpected results may arise from  inaccuracies in the declared medications.  **Note: The testing scope of this panel includes these medications:  Hydrocodone (Norco)  **Note: The testing scope of this panel does not include the  following reported medications:  Acetaminophen (Norco)  Amlodipine  Calcium  Cholecalciferol  Ciclopirox  Doxycycline  Eye Drops  Fluocinolone  Hydrochlorothiazide  Levothyroxine (Synthroid)  Magnesium (Mag-Ox)  Meloxicam  Potassium  Supplement  Topical ==================================================================== For clinical consultation, please call [phone]. ====================================================================    Laboratory Chemistry Profile (12 mo)  Renal: 01/17/2019: BUN 14; BUN/Creatinine Ratio 14; Creatinine, Ser 1.00   Lab Results  Component Value Date   GFRAA 70 01/17/2019   GFRNONAA 61 01/17/2019   Hepatic: 01/17/2019: Albumin 4.8 Lab Results  Component Value Date   AST 32 01/17/2019   ALT 47 05/01/2016   Other: 01/17/2019: 25-Hydroxy, Vitamin D 36; 25-Hydroxy, Vitamin D-2 <1.0; 25-Hydroxy, Vitamin D-3 35; CRP 11; Sed Rate 39; Vitamin B-12 498 Note: Above Lab results reviewed.  Imaging  US THYROID CLINICAL DATA:  Follow-up thyroid nodules.  EXAM: THYROID ULTRASOUND  TECHNIQUE: Ultrasound examination of the thyroid gland and adjacent soft tissues was performed.  COMPARISON:  Thyroid ultrasound - 09/27/2013 ; 10/04/2012; 02/26/2012; ultrasound-guided right-sided thyroid nodule biopsy - 03/30/2012  FINDINGS: Grossly unchanged diffuse heterogeneity of the thyroid parenchymal echotexture and. No definitive new or enlarging thyroid nodules.  Right thyroid lobe  Measurements: Slightly diminutive in size measuring 3.2 x 1.4 x 2.0 cm, previously, 5.1 x 1.9 x 1.7 cm.  Right, mid, anterior - 1.2 x 1.0 x 2.0 cm - mixed echogenic, solid - grossly unchanged since the 02/2012 examination, previously, 2.0 x 0.9 x 1.4 cm. This nodule was previously biopsied.  Left thyroid lobe  Measurements: Slightly diminutive in size measuring 3.3 x 1.6 x 1.8 cm, unchanged, previously, 5.4 x 2.0 x 1.4 cm.  Left, superior - 0.5 cm - echogenic dystrophic calcification with posterior acoustic shadowing - unchanged since the 02/2012 examination  Isthmus  Thickness: Normal in size measuring 0.6 cm in  diameter, unchanged.  There is mild diffuse heterogeneity of the thyroid isthmus without discrete nodule or mass.  Lymphadenopathy  None visualized.  IMPRESSION: 1. No new or enlarging thyroid nodules with all discretely measured thyroid nodules including previously biopsied dominant approximately 2 cm nodule within the right lobe of thyroid appearing unchanged since most remote examination performed  02/2012. 2. Nonspecific apparent mild interval atrophy of both the right and left lobes of the thyroid with the right lobe now measuring 3.2 cm in maximal diameter (previously, 5.1 cm) and the left lobe measuring 3.3 cm (previously, 5.4 cm). Correlation with thyroid function tests is recommended.  Electronically Signed   By: Sandi Mariscal M.D.   On: 10/15/2015 15:26   Assessment  Diagnoses of Chronic pain syndrome and Osteoarthritis involving multiple joints were pertinent to this visit.  Plan of Care  Problem-specific:  No problem-specific Assessment & Plan notes found for this encounter.  I am having Alison Cox start on HYDROcodone-acetaminophen and HYDROcodone-acetaminophen. I am also having her maintain her hydrochlorothiazide, levothyroxine, prednisoLONE acetate, amLODipine, calcium carbonate, Ciclopirox, Crisaborole, Difluprednate, Fluocinolone Acetonide, Ivermectin, Doxycycline Hyclate, nystatin-triamcinolone, Potassium Chloride CR, Benefiber, Cholecalciferol, Magnesium Oxide, HYDROcodone-acetaminophen, and meloxicam.  Pharmacotherapy (Medications Ordered): Meds ordered this encounter  Medications  . HYDROcodone-acetaminophen (NORCO) 10-325 MG tablet    Sig: Take 1 tablet by mouth every 6 (six) hours as needed for severe pain. Must last 30 days    Dispense:  120 tablet    Refill:  0    Chronic Pain: STOP Act (Not applicable) Fill 1 day early if closed on refill date. Do not fill until: 04/27/2019. To last until: 05/27/2019. Avoid benzodiazepines within 8 hours of opioids  . HYDROcodone-acetaminophen (NORCO) 10-325 MG tablet    Sig: Take 1 tablet by mouth every 6 (six) hours as needed for severe pain. Must last 30 days    Dispense:  120 tablet    Refill:  0    Chronic Pain: STOP Act (Not applicable) Fill 1 day early if closed on refill date. Do not fill until: 05/27/2019. To last until: 06/26/2019. Avoid benzodiazepines within 8 hours of opioids  . HYDROcodone-acetaminophen (NORCO)  10-325 MG tablet    Sig: Take 1 tablet by mouth every 6 (six) hours as needed for severe pain. Must last 30 days    Dispense:  120 tablet    Refill:  0    Chronic Pain: STOP Act (Not applicable) Fill 1 day early if closed on refill date. Do not fill until: 06/26/2019. To last until: 07/26/2019. Avoid benzodiazepines within 8 hours of opioids  . meloxicam (MOBIC) 15 MG tablet    Sig: Take 1 tablet (15 mg total) by mouth daily.    Dispense:  90 tablet    Refill:  1    Fill one day early if pharmacy is closed on scheduled refill date. May substitute for generic if available.   Orders:  No orders of the defined types were placed in this encounter.  Follow-up plan:   Return in about 14 weeks (around 07/25/2019) for (VV), (MM).      Under consideration:   None at this time. The patient's body habitus makes any interventional therapy technically challenging and decreases the chances of success. The patient has been informed that once her BMI goes down low 35, we will revisit and offer her other alternatives.   Therapeutic/palliative (PRN):   None at this time.    Recent Visits Date Type Provider Dept  01/24/19 Office Visit Dossie Arbour,  Beatriz Chancellor, MD [facility]  Showing recent visits within past 90 days and meeting all other requirements   Today's Visits Date Type Provider Dept  04/18/19 Telemedicine Milinda Pointer, MD [facility]  Showing today's visits and meeting all other requirements   Future Appointments No visits were found meeting these conditions.  Showing future appointments within next 90 days and meeting all other requirements   I discussed the assessment and treatment plan with the patient. The patient was provided an opportunity to ask questions and all were answered. The patient agreed with the plan and demonstrated an understanding of the instructions.  Patient advised to call back or seek an in-person evaluation if the symptoms or condition  worsens.  Total duration of non-face-to-face encounter: 13 minutes.  Note by: Gaspar Cola, MD Date: 04/18/2019; Time: 8:24 AM

## 2019-04-18 ENCOUNTER — Ambulatory Visit: Payer: 59 | Attending: Pain Medicine | Admitting: Pain Medicine

## 2019-04-18 ENCOUNTER — Other Ambulatory Visit: Payer: Self-pay

## 2019-04-18 DIAGNOSIS — M159 Polyosteoarthritis, unspecified: Secondary | ICD-10-CM

## 2019-04-18 DIAGNOSIS — G894 Chronic pain syndrome: Secondary | ICD-10-CM

## 2019-04-18 MED ORDER — HYDROCODONE-ACETAMINOPHEN 10-325 MG PO TABS: 1.0000 | ORAL_TABLET | Freq: Four times a day (QID) | ORAL | 0 refills | Status: DC | PRN

## 2019-04-18 MED ORDER — MELOXICAM 15 MG PO TABS: 15.0000 mg | ORAL_TABLET | Freq: Every day | ORAL | 1 refills | Status: DC

## 2019-04-20 ENCOUNTER — Ambulatory Visit: Payer: 59 | Admitting: Pain Medicine

## 2019-07-21 ENCOUNTER — Telehealth: Payer: Self-pay | Admitting: *Deleted

## 2019-07-21 ENCOUNTER — Encounter: Payer: Self-pay | Admitting: Pain Medicine

## 2019-07-21 NOTE — Telephone Encounter (Signed)
Attempted to call for pre appointment review of allergies/meds. Message left. 

## 2019-07-24 NOTE — Progress Notes (Signed)
Patient: Alison Cox  Service Category: E/M  Provider: Gaspar Cola, MD  DOB: 1956-03-12  DOS: 07/25/2019  Location: Office  MRN: [id number]  Setting: Ambulatory outpatient  Referring Provider: Lajean Manes, MD  Type: Established Patient  Specialty: Interventional Pain Management  PCP: Lajean Manes, MD  Location: Remote location  Delivery: TeleHealth     Virtual Encounter - Pain Management PROVIDER NOTE: Information contained herein reflects review and annotations entered in association with encounter. Interpretation of such information and data should be left to medically-trained personnel. Information provided to patient can be located elsewhere in the medical record under "Patient Instructions". Document created using STT-dictation technology, any transcriptional errors that may result from process are unintentional.    Contact & Pharmacy Preferred: [phone] Home: [phone] (home) Mobile: [phone] (mobile) E-mail: [email or web address]  [id number] [id number]: [phone]: [phone]  Pre-screening  Ms. FRosann Auerbachoffered "in-person" vs "virtual" encounter. She indicated preferring virtual for this encounter.   Reason COVID-19*  Social distancing based on CDC and AMA recommendations.   I contacted IHarlin Heyson 07/25/2019 via telephone.      I clearly identified myself as FGaspar Cola MD. I verified that I was speaking with the correct person using two identifiers (Name: Alison Cox and date of birth: 1June 06, 1957.  Consent I sought verbal advanced consent from IHarlin Heysfor virtual visit interactions. I informed Ms. FGeigerof possible security and privacy concerns, risks, and limitations associated with providing "not-in-person" medical evaluation and management services. I also informed Ms. FPincockof the availability of "in-person" appointments. Finally, I informed her  that there would be a charge for the virtual visit and that she could be  personally, fully or partially, financially responsible for it. Ms. FBlakleyexpressed understanding and agreed to proceed.   Historic Elements   Ms. Alison MUSKAis a 64y.o. year old, female patient evaluated today after her last contact with our practice on 07/21/2019. Ms. FWamble has a past medical history of History of Bell's palsy (03/06/2015), History of Hashimoto thyroiditis (03/06/2015), History of nephrolithiasis (03/06/2015), Hypertension, Kidney stone, Macular corneal dystrophy (2005), Osteoarthritis, and Thyroid disease. She also  has a past surgical history that includes Endometrial ablation; DILATATION AND CURETTAGE; Corneal transplant; and Corneal transplant. Ms. FParkhursthas a current medication list which includes the following prescription(s): amlodipine, calcium carbonate, cholecalciferol, ciclopirox, crisaborole, difluprednate, doxycycline hyclate, fluocinolone acetonide, hydrochlorothiazide, hydrocodone-acetaminophen, ivermectin, levothyroxine, magnesium oxide, meloxicam, nystatin-triamcinolone, potassium chloride cr, prednisolone acetate, and benefiber. She  reports that she has never smoked. She has never used smokeless tobacco. She reports that she does not drink alcohol or use drugs. Ms. FGleghornis allergic to penicillins; shellfish allergy; and sulfa antibiotics.   HPI  Today, she is being contacted for medication management.  The patient indicates doing well with the current medication regimen. No adverse reactions or side effects reported to the medications. Patient also had COVID-19 vaccine questions.  Pharmacotherapy Assessment  Analgesic: Hydrocodone/APAP 10/325 one tablet by mouth every 6 hours (total: 40 mg/day) MME/day:40 mg/day.   Monitoring: [place] PMP: PDMP reviewed during this encounter.       Pharmacotherapy: No side-effects or adverse reactions reported. Compliance: No problems  identified. Effectiveness: Clinically acceptable. Plan: Refer to "POC".  UDS:  Summary  Date Value Ref Range Status  01/17/2019 Note  Final    Comment:    ==================================================================== ToxASSURE Select 13 (MW) ==================================================================== Test  Result       Flag       Units Drug Present and Declared for Prescription Verification   Hydrocodone                    2134         EXPECTED   ng/mg creat   Hydromorphone                  443          EXPECTED   ng/mg creat   Dihydrocodeine                 517          EXPECTED   ng/mg creat   Norhydrocodone                 1312         EXPECTED   ng/mg creat    Sources of hydrocodone include scheduled prescription medications.    Hydromorphone, dihydrocodeine and norhydrocodone are expected    metabolites of hydrocodone. Hydromorphone and dihydrocodeine are    also available as scheduled prescription medications. ==================================================================== Test                      Result    Flag   Units      Ref Range   Creatinine              265              mg/dL      >=20 ==================================================================== Declared Medications:  The flagging and interpretation on this report are based on the  following declared medications.  Unexpected results may arise from  inaccuracies in the declared medications.  **Note: The testing scope of this panel includes these medications:  Hydrocodone (Norco)  **Note: The testing scope of this panel does not include the  following reported medications:  Acetaminophen (Norco)  Amlodipine  Calcium  Cholecalciferol  Ciclopirox  Doxycycline  Eye Drops  Fluocinolone  Hydrochlorothiazide  Levothyroxine (Synthroid)  Magnesium (Mag-Ox)  Meloxicam  Potassium  Supplement   Topical ==================================================================== For clinical consultation, please call [phone]. ====================================================================    Laboratory Chemistry Profile   Renal Lab Results  Component Value Date   BUN 14 01/17/2019   CREATININE 1.00 01/17/2019   BCR 14 01/17/2019   GFRAA 70 01/17/2019   GFRNONAA 61 01/17/2019     Hepatic Lab Results  Component Value Date   AST 32 01/17/2019   ALT 47 05/01/2016   ALBUMIN 4.8 01/17/2019   ALKPHOS 112 01/17/2019     Electrolytes Lab Results  Component Value Date   NA 141 01/17/2019   K 4.2 01/17/2019   CL 100 01/17/2019   CALCIUM 10.5 (H) 01/17/2019   MG 2.1 01/17/2019     Bone Lab Results  Component Value Date   25OHVITD1 36 01/17/2019   25OHVITD2 <1.0 01/17/2019   25OHVITD3 35 01/17/2019     Inflammation (CRP: Acute Phase) (ESR: Chronic Phase) Lab Results  Component Value Date   CRP 11 (H) 01/17/2019   ESRSEDRATE 39 01/17/2019       Note: Above Lab results reviewed.  Imaging  US THYROID CLINICAL DATA:  Follow-up thyroid nodules.  EXAM: THYROID ULTRASOUND  TECHNIQUE: Ultrasound examination of the thyroid gland and adjacent soft tissues was performed.  COMPARISON:  Thyroid ultrasound - 09/27/2013 ; 10/04/2012; 02/26/2012; ultrasound-guided right-sided thyroid nodule biopsy - 03/30/2012  FINDINGS: Grossly  unchanged diffuse heterogeneity of the thyroid parenchymal echotexture and. No definitive new or enlarging thyroid nodules.  Right thyroid lobe  Measurements: Slightly diminutive in size measuring 3.2 x 1.4 x 2.0 cm, previously, 5.1 x 1.9 x 1.7 cm.  Right, mid, anterior - 1.2 x 1.0 x 2.0 cm - mixed echogenic, solid - grossly unchanged since the 02/2012 examination, previously, 2.0 x 0.9 x 1.4 cm. This nodule was previously biopsied.  Left thyroid lobe  Measurements: Slightly diminutive in size measuring 3.3 x 1.6 x 1.8 cm,  unchanged, previously, 5.4 x 2.0 x 1.4 cm.  Left, superior - 0.5 cm - echogenic dystrophic calcification with posterior acoustic shadowing - unchanged since the 02/2012 examination  Isthmus  Thickness: Normal in size measuring 0.6 cm in diameter, unchanged.  There is mild diffuse heterogeneity of the thyroid isthmus without discrete nodule or mass.  Lymphadenopathy  None visualized.  IMPRESSION: 1. No new or enlarging thyroid nodules with all discretely measured thyroid nodules including previously biopsied dominant approximately 2 cm nodule within the right lobe of thyroid appearing unchanged since most remote examination performed 02/2012. 2. Nonspecific apparent mild interval atrophy of both the right and left lobes of the thyroid with the right lobe now measuring 3.2 cm in maximal diameter (previously, 5.1 cm) and the left lobe measuring 3.3 cm (previously, 5.4 cm). Correlation with thyroid function tests is recommended.  Electronically Signed   By: Sandi Mariscal M.D.   On: 10/15/2015 15:26  Assessment  The encounter diagnosis was Chronic pain syndrome.  Plan of Care  Problem-specific:  No problem-specific Assessment & Plan notes found for this encounter.  Ms. NAKISHA CHAI has a current medication list which includes the following long-term medication(s): amlodipine, calcium carbonate, cholecalciferol, hydrochlorothiazide, hydrocodone-acetaminophen, levothyroxine, magnesium oxide, meloxicam, potassium chloride cr, and benefiber.  Pharmacotherapy (Medications Ordered): No orders of the defined types were placed in this encounter.  Orders:  No orders of the defined types were placed in this encounter.  Follow-up plan:   Return in about 3 months (around 10/24/2019) for (VV), (MM).      Under consideration:   None at this time. The patient's body habitus makes any interventional therapy technically challenging and decreases the chances of success. The patient has been  informed that once her BMI goes down low 35, we will revisit and offer her other alternatives.   Therapeutic/palliative (PRN):   None at this time.     Recent Visits No visits were found meeting these conditions.  Showing recent visits within past 90 days and meeting all other requirements   Future Appointments Date Type Provider Dept  07/25/19 Telemedicine Milinda Pointer, MD [facility]  Showing future appointments within next 90 days and meeting all other requirements   I discussed the assessment and treatment plan with the patient. The patient was provided an opportunity to ask questions and all were answered. The patient agreed with the plan and demonstrated an understanding of the instructions.  Patient advised to call back or seek an in-person evaluation if the symptoms or condition worsens.  Duration of encounter: 13 minutes.  Note by: Gaspar Cola, MD Date: 07/25/2019; Time: 9:40 PM

## 2019-07-25 ENCOUNTER — Other Ambulatory Visit: Payer: Self-pay

## 2019-07-25 ENCOUNTER — Ambulatory Visit: Payer: 59 | Attending: Pain Medicine | Admitting: Pain Medicine

## 2019-07-25 DIAGNOSIS — G8929 Other chronic pain: Secondary | ICD-10-CM

## 2019-07-25 DIAGNOSIS — M25571 Pain in right ankle and joints of right foot: Secondary | ICD-10-CM

## 2019-07-25 DIAGNOSIS — G894 Chronic pain syndrome: Secondary | ICD-10-CM | POA: Diagnosis not present

## 2019-07-25 DIAGNOSIS — M25561 Pain in right knee: Secondary | ICD-10-CM | POA: Diagnosis not present

## 2019-07-25 DIAGNOSIS — M25572 Pain in left ankle and joints of left foot: Secondary | ICD-10-CM

## 2019-07-25 DIAGNOSIS — M545 Low back pain: Secondary | ICD-10-CM

## 2019-07-25 DIAGNOSIS — M25562 Pain in left knee: Secondary | ICD-10-CM

## 2019-07-25 MED ORDER — HYDROCODONE-ACETAMINOPHEN 10-325 MG PO TABS: 1.0000 | ORAL_TABLET | Freq: Four times a day (QID) | ORAL | 0 refills | Status: DC | PRN

## 2019-09-15 ENCOUNTER — Ambulatory Visit
Admission: RE | Admit: 2019-09-15 | Discharge: 2019-09-15 | Disposition: A | Discharge status: Home / Self Care | Payer: 59 | Source: Ambulatory Visit | Attending: Geriatric Medicine | Admitting: Geriatric Medicine

## 2019-09-15 ENCOUNTER — Other Ambulatory Visit: Payer: Self-pay | Admitting: Geriatric Medicine

## 2019-09-15 DIAGNOSIS — R0609 Other forms of dyspnea: Secondary | ICD-10-CM

## 2019-10-14 ENCOUNTER — Other Ambulatory Visit: Payer: Self-pay | Admitting: Pain Medicine

## 2019-10-14 DIAGNOSIS — M159 Polyosteoarthritis, unspecified: Secondary | ICD-10-CM

## 2019-10-19 NOTE — Progress Notes (Signed)
Patient: Alison Cox  Service Category: E/M  Provider: Francisco A Naveira, MD  DOB: 10/03/1955  DOS: 10/20/2019  Location: Office  MRN: [id number]  Setting: Ambulatory outpatient  Referring Provider: Stoneking, Hal, MD  Type: Established Patient  Specialty: Interventional Pain Management  PCP: Stoneking, Hal, MD  Location: Remote location  Delivery: TeleHealth     Virtual Encounter - Pain Management PROVIDER NOTE: Information contained herein reflects review and annotations entered in association with encounter. Interpretation of such information and data should be left to medically-trained personnel. Information provided to patient can be located elsewhere in the medical record under "Patient Instructions". Document created using STT-dictation technology, any transcriptional errors that may result from process are unintentional.    Contact & Pharmacy Preferred: [phone] Home: [phone] (home) Mobile: [phone] (mobile) E-mail: [email or web address]  [id number] [place] [place] - [address] [place] [address] Phone: [phone] Fax: [phone]   Pre-screening  Ms. Rubinstein offered "in-person" vs "virtual" encounter. She indicated preferring virtual for this encounter.   Reason COVID-19*  Social distancing based on CDC and AMA recommendations.   I contacted Kamarii R Vlachos on 10/20/2019 via telephone.      I clearly identified myself as Francisco A Naveira, MD. I verified that I was speaking with the correct person using two identifiers (Name: Dayannara R Beevers, and date of birth: 04/17/1961).  Consent I sought verbal advanced consent from Jessika R Meetze for virtual visit interactions. I informed Ms. Brian of possible security and privacy concerns, risks, and limitations associated with providing "not-in-person" medical evaluation and management services. I also informed Ms. Mckelvin of the availability of "in-person" appointments. Finally, I informed her  that there would be a charge for the virtual visit and that she could be  personally, fully or partially, financially responsible for it. Ms. Macphail expressed understanding and agreed to proceed.   Historic Elements   Ms. Farida R Steel is a 64 y.o. year old, female patient evaluated today after her last contact with our practice on 10/14/2019. Ms. Cantave  has a past medical history of History of Bell's palsy (03/06/2015), History of Hashimoto thyroiditis (03/06/2015), History of nephrolithiasis (03/06/2015), Hypertension, Kidney stone, Macular corneal dystrophy (2005), Osteoarthritis, and Thyroid disease. She also  has a past surgical history that includes Endometrial ablation; DILATATION AND CURETTAGE; Corneal transplant; and Corneal transplant. Ms. Warga has a current medication list which includes the following prescription(s): amlodipine, calcium carbonate, cholecalciferol, ciclopirox, crisaborole, difluprednate, doxycycline hyclate, fluocinolone acetonide, furosemide, hydrochlorothiazide, ivermectin, levothyroxine, magnesium oxide, nystatin-triamcinolone, potassium chloride cr, prednisolone acetate, spironolactone, [START ON 10/24/2019] hydrocodone-acetaminophen, [START ON 11/23/2019] hydrocodone-acetaminophen, [START ON 12/23/2019] hydrocodone-acetaminophen, [START ON 10/24/2019] meloxicam, and benefiber. She  reports that she has never smoked. She has never used smokeless tobacco. She reports that she does not drink alcohol and does not use drugs. Ms. Holmes is allergic to penicillins, shellfish allergy, and sulfa antibiotics.   HPI  Today, she is being contacted for medication management.  The patient indicates doing well with the current medication regimen. No adverse reactions or side effects reported to the medications.   Pharmacotherapy Assessment  Analgesic: Hydrocodone/APAP 10/325 one tablet by mouth every 6 hours (total: 40 mg/day) MME/day:40 mg/day.   Monitoring: [place] PMP: PDMP reviewed during this  encounter.       Pharmacotherapy: No side-effects or adverse reactions reported. Compliance: No problems identified. Effectiveness: Clinically acceptable. Plan: Refer to "POC".  UDS:  Summary  Date Value Ref Range Status  01/17/2019 Note  Final      Comment:    ==================================================================== ToxASSURE Select 13 (MW) ==================================================================== Test                             Result       Flag       Units Drug Present and Declared for Prescription Verification   Hydrocodone                    2134         EXPECTED   ng/mg creat   Hydromorphone                  443          EXPECTED   ng/mg creat   Dihydrocodeine                 517          EXPECTED   ng/mg creat   Norhydrocodone                 1312         EXPECTED   ng/mg creat    Sources of hydrocodone include scheduled prescription medications.    Hydromorphone, dihydrocodeine and norhydrocodone are expected    metabolites of hydrocodone. Hydromorphone and dihydrocodeine are    also available as scheduled prescription medications. ==================================================================== Test                      Result    Flag   Units      Ref Range   Creatinine              265              mg/dL      >=20 ==================================================================== Declared Medications:  The flagging and interpretation on this report are based on the  following declared medications.  Unexpected results may arise from  inaccuracies in the declared medications.  **Note: The testing scope of this panel includes these medications:  Hydrocodone (Norco)  **Note: The testing scope of this panel does not include the  following reported medications:  Acetaminophen (Norco)  Amlodipine  Calcium  Cholecalciferol  Ciclopirox  Doxycycline  Eye Drops  Fluocinolone  Hydrochlorothiazide  Levothyroxine (Synthroid)  Magnesium (Mag-Ox)   Meloxicam  Potassium  Supplement  Topical ==================================================================== For clinical consultation, please call [phone]. ====================================================================     Laboratory Chemistry Profile   Renal Lab Results  Component Value Date   BUN 14 01/17/2019   CREATININE 1.00 01/17/2019   BCR 14 01/17/2019   GFRAA 70 01/17/2019   GFRNONAA 61 01/17/2019     Hepatic Lab Results  Component Value Date   AST 32 01/17/2019   ALT 47 05/01/2016   ALBUMIN 4.8 01/17/2019   ALKPHOS 112 01/17/2019     Electrolytes Lab Results  Component Value Date   NA 141 01/17/2019   K 4.2 01/17/2019   CL 100 01/17/2019   CALCIUM 10.5 (H) 01/17/2019   MG 2.1 01/17/2019     Bone Lab Results  Component Value Date   25OHVITD1 36 01/17/2019   25OHVITD2 <1.0 01/17/2019   25OHVITD3 35 01/17/2019     Inflammation (CRP: Acute Phase) (ESR: Chronic Phase) Lab Results  Component Value Date   CRP 11 (H) 01/17/2019   ESRSEDRATE 39 01/17/2019       Note: Above Lab results reviewed.   Imaging  DG Chest 2 View CLINICAL  DATA:  Shortness of breath for several weeks  EXAM: CHEST - 2 VIEW  COMPARISON:  9/2/9  FINDINGS: The heart size and mediastinal contours are within normal limits. Both lungs are clear. The visualized skeletal structures are unremarkable.  IMPRESSION: No active cardiopulmonary disease.  Electronically Signed   By: Mark  Lukens M.D.   On: 09/16/2019 08:40  Assessment  The primary encounter diagnosis was Chronic pain syndrome. Diagnoses of Pharmacologic therapy, Osteoarthritis involving multiple joints, and Therapeutic opioid-induced constipation (OIC) were also pertinent to this visit.  Plan of Care  Problem-specific:  No problem-specific Assessment & Plan notes found for this encounter.  Ms. Paytience R Schwan has a current medication list which includes the following long-term medication(s):  amlodipine, calcium carbonate, cholecalciferol, furosemide, hydrochlorothiazide, levothyroxine, magnesium oxide, potassium chloride cr, spironolactone, [START ON 10/24/2019] hydrocodone-acetaminophen, [START ON 11/23/2019] hydrocodone-acetaminophen, [START ON 12/23/2019] hydrocodone-acetaminophen, [START ON 10/24/2019] meloxicam, and benefiber.  Pharmacotherapy (Medications Ordered): Meds ordered this encounter  Medications  . HYDROcodone-acetaminophen (NORCO) 10-325 MG tablet    Sig: Take 1 tablet by mouth every 6 (six) hours as needed for severe pain. Must last 30 days    Dispense:  120 tablet    Refill:  0    Chronic Pain: STOP Act (Not applicable) Fill 1 day early if closed on refill date. Do not fill until: 10/24/2019. To last until: 11/23/2019. Avoid benzodiazepines within 8 hours of opioids  . HYDROcodone-acetaminophen (NORCO) 10-325 MG tablet    Sig: Take 1 tablet by mouth every 6 (six) hours as needed for severe pain. Must last 30 days    Dispense:  120 tablet    Refill:  0    Chronic Pain: STOP Act (Not applicable) Fill 1 day early if closed on refill date. Do not fill until: 11/23/2019. To last until: 12/23/2019. Avoid benzodiazepines within 8 hours of opioids  . HYDROcodone-acetaminophen (NORCO) 10-325 MG tablet    Sig: Take 1 tablet by mouth every 6 (six) hours as needed for severe pain. Must last 30 days    Dispense:  120 tablet    Refill:  0    Chronic Pain: STOP Act (Not applicable) Fill 1 day early if closed on refill date. Do not fill until: 12/23/2019. To last until: 01/22/2020. Avoid benzodiazepines within 8 hours of opioids  . meloxicam (MOBIC) 15 MG tablet    Sig: Take 1 tablet (15 mg total) by mouth daily.    Dispense:  90 tablet    Refill:  1    Fill one day early if pharmacy is closed on scheduled refill date. May substitute for generic if available.  . Wheat Dextrin (BENEFIBER) POWD    Sig: Stir 2 teaspoons of Benefiber into 4-8 oz of any non-carbonated beverage or soft food (hot or  cold) TID.    Dispense:  500 g    Refill:  PRN    Fill one day early if pharmacy is closed on scheduled refill date. May substitute for generic if available.   Orders:  Orders Placed This Encounter  Procedures  . ToxASSURE Select 13 (MW), Urine    Volume: 30 ml(s). Minimum 3 ml of urine is needed. Document temperature of fresh sample. Indications: Long term (current) use of opiate analgesic (Z79.891)    Order Specific Question:   Release to patient    Answer:   Immediate   Follow-up plan:   Return in about 3 months (around 01/18/2020) for F2F(20-min), MM (never on procedure day).      Under   consideration:   None at this time. The patient's body habitus makes any interventional therapy technically challenging and decreases the chances of success. The patient has been informed that once her BMI goes down low 35, we will revisit and offer her other alternatives.   Therapeutic/palliative (PRN):   None at this time.      Recent Visits Date Type Provider Dept  07/25/19 Telemedicine Milinda Pointer, MD [facility]  Showing recent visits within past 90 days and meeting all other requirements Today's Visits Date Type Provider Dept  10/20/19 Telemedicine Milinda Pointer, MD [facility]  Showing today's visits and meeting all other requirements Future Appointments No visits were found meeting these conditions. Showing future appointments within next 90 days and meeting all other requirements  I discussed the assessment and treatment plan with the patient. The patient was provided an opportunity to ask questions and all were answered. The patient agreed with the plan and demonstrated an understanding of the instructions.  Patient advised to call back or seek an in-person evaluation if the symptoms or condition worsens.  Duration of encounter: 15 minutes.  Note by: Gaspar Cola, MD Date: 10/20/2019; Time: 3:49 PM

## 2019-10-20 ENCOUNTER — Encounter: Payer: Self-pay | Admitting: Pain Medicine

## 2019-10-20 ENCOUNTER — Ambulatory Visit: Payer: 59 | Attending: Pain Medicine | Admitting: Pain Medicine

## 2019-10-20 ENCOUNTER — Other Ambulatory Visit: Payer: Self-pay

## 2019-10-20 DIAGNOSIS — M8949 Other hypertrophic osteoarthropathy, multiple sites: Secondary | ICD-10-CM | POA: Diagnosis not present

## 2019-10-20 DIAGNOSIS — T402X5A Adverse effect of other opioids, initial encounter: Secondary | ICD-10-CM

## 2019-10-20 DIAGNOSIS — Z79899 Other long term (current) drug therapy: Secondary | ICD-10-CM | POA: Diagnosis not present

## 2019-10-20 DIAGNOSIS — G894 Chronic pain syndrome: Secondary | ICD-10-CM

## 2019-10-20 DIAGNOSIS — M159 Polyosteoarthritis, unspecified: Secondary | ICD-10-CM

## 2019-10-20 DIAGNOSIS — K5903 Drug induced constipation: Secondary | ICD-10-CM | POA: Diagnosis not present

## 2019-10-20 MED ORDER — HYDROCODONE-ACETAMINOPHEN 10-325 MG PO TABS: 1.0000 | ORAL_TABLET | Freq: Four times a day (QID) | ORAL | 0 refills | Status: DC | PRN

## 2019-10-20 MED ORDER — MELOXICAM 15 MG PO TABS: 15.0000 mg | ORAL_TABLET | Freq: Every day | ORAL | 1 refills | Status: DC

## 2019-10-20 MED ORDER — BENEFIBER PO POWD: ORAL | 99 refills | Status: DC

## 2019-11-22 ENCOUNTER — Other Ambulatory Visit: Payer: Self-pay | Admitting: Dermatology

## 2019-12-14 ENCOUNTER — Ambulatory Visit: Payer: 59 | Admitting: Dermatology

## 2019-12-14 ENCOUNTER — Other Ambulatory Visit: Payer: Self-pay

## 2019-12-14 DIAGNOSIS — B351 Tinea unguium: Secondary | ICD-10-CM

## 2019-12-14 DIAGNOSIS — L84 Corns and callosities: Secondary | ICD-10-CM

## 2019-12-14 DIAGNOSIS — L719 Rosacea, unspecified: Secondary | ICD-10-CM

## 2019-12-14 DIAGNOSIS — L219 Seborrheic dermatitis, unspecified: Secondary | ICD-10-CM

## 2019-12-14 MED ORDER — JUBLIA 10 % EX SOLN: CUTANEOUS | 2 refills | Status: DC

## 2019-12-14 MED ORDER — FLUOCINOLONE ACETONIDE BODY 0.01 % EX OIL: TOPICAL_OIL | CUTANEOUS | 5 refills | Status: DC

## 2019-12-14 MED ORDER — KETOCONAZOLE 2 % EX SHAM
1.0000 | MEDICATED_SHAMPOO | Freq: Once | CUTANEOUS | 6 refills | Status: AC
Start: 2019-12-14 — End: 2019-12-14

## 2019-12-14 MED ORDER — UREA 47 % EX CREA: TOPICAL_CREAM | CUTANEOUS | 2 refills | Status: DC

## 2019-12-14 NOTE — Progress Notes (Signed)
   Follow-Up Visit   Subjective  Alison Cox is a 64 y.o. female who presents for the following: Rosacea (1 year f/u on Rosacea on her face, treating with Doxycyline tablets with a poor response ). Pt here for refills of toenail topical solution, shampoo for scalp, topical cream for callus on feet   The following portions of the chart were reviewed this encounter and updated as appropriate:  Tobacco  Allergies  Meds  Problems  Med Hx  Surg Hx  Fam Hx     Review of Systems:  No other skin or systemic complaints except as noted in HPI or Assessment and Plan.  Objective  Well appearing patient in no apparent distress; mood and affect are within normal limits.  A focused examination was performed including face. Relevant physical exam findings are noted in the Assessment and Plan.  Objective  Head - Anterior (Face): erythema with telangiectasias +/- scattered inflammatory papules.   Objective  Head - Anterior (Face): Pink patches with greasy scale.   Objective  Right Foot - Anterior: Callus skin   Objective  feet: Thicken toenails    Assessment & Plan  Rosacea - severe; persistent and resistant to treatments Head - Anterior (Face) Rosacea/Acne  Discussed Isotretinoin therapy   D/C Doxycycline  Plan on starting Isotretinoin 20 mg daily pending labs  Pt registered [place] program  #[id number]  Pt registered as Patient is a Female NOT of Reproductive Potential (FNRP)  Pharmacy [facility]  Other Related Procedures Comprehensive metabolic panel Lipid panel hCG, serum, qualitative Follicle Stimulating Hormone Luteinizing Hormone  Seborrheic dermatitis Scalp; ears Ordered Medications: Fluocinolone Acetonide Body 0.01 % OIL  Callus Right Foot - Anterior Ordered Medications: Urea 47 % CREA  Onychomycosis Feet/nails Start Jublia apply to nails daily   Ordered Medications: Efinaconazole (JUBLIA) 10 % SOLN  Return in about 1 month (around 01/14/2020)  for Acne/Rosacea .  IMarye Round, CMA, am acting as scribe for Sarina Ser, MD .  Documentation: I have reviewed the above documentation for accuracy and completeness, and I agree with the above.  Sarina Ser, MD

## 2019-12-21 ENCOUNTER — Telehealth: Payer: Self-pay

## 2019-12-21 ENCOUNTER — Encounter: Payer: Self-pay | Admitting: Dermatology

## 2019-12-21 NOTE — Telephone Encounter (Signed)
Called pt Alison Cox on VM please call here, Dr Raliegh Ip would like to know if she had her labs drawn to begin Isotretinoin

## 2019-12-30 ENCOUNTER — Other Ambulatory Visit: Payer: Self-pay | Admitting: Pain Medicine

## 2019-12-30 ENCOUNTER — Other Ambulatory Visit: Payer: Self-pay | Admitting: Dermatology

## 2019-12-30 DIAGNOSIS — K5903 Drug induced constipation: Secondary | ICD-10-CM

## 2020-01-10 ENCOUNTER — Other Ambulatory Visit: Payer: Self-pay | Admitting: *Deleted

## 2020-01-10 DIAGNOSIS — Z79891 Long term (current) use of opiate analgesic: Secondary | ICD-10-CM

## 2020-01-10 DIAGNOSIS — G894 Chronic pain syndrome: Secondary | ICD-10-CM

## 2020-01-10 DIAGNOSIS — Z79899 Other long term (current) drug therapy: Secondary | ICD-10-CM

## 2020-01-12 LAB — TOXASSURE SELECT 13 (MW), URINE

## 2020-01-14 ENCOUNTER — Other Ambulatory Visit: Payer: Self-pay | Admitting: Pain Medicine

## 2020-01-14 DIAGNOSIS — M7918 Myalgia, other site: Secondary | ICD-10-CM

## 2020-01-14 DIAGNOSIS — E559 Vitamin D deficiency, unspecified: Secondary | ICD-10-CM

## 2020-01-16 ENCOUNTER — Encounter: Payer: Self-pay | Admitting: Student in an Organized Health Care Education/Training Program

## 2020-01-16 ENCOUNTER — Other Ambulatory Visit: Payer: Self-pay

## 2020-01-16 ENCOUNTER — Ambulatory Visit: Payer: 59 | Attending: Pain Medicine | Admitting: Student in an Organized Health Care Education/Training Program

## 2020-01-16 VITALS — BP 149/97 | HR 113 | Resp 20 | Ht 63.0 in | Wt 295.0 lb

## 2020-01-16 DIAGNOSIS — M899 Disorder of bone, unspecified: Secondary | ICD-10-CM

## 2020-01-16 DIAGNOSIS — G8929 Other chronic pain: Secondary | ICD-10-CM

## 2020-01-16 DIAGNOSIS — G894 Chronic pain syndrome: Secondary | ICD-10-CM | POA: Diagnosis not present

## 2020-01-16 DIAGNOSIS — M25571 Pain in right ankle and joints of right foot: Secondary | ICD-10-CM

## 2020-01-16 DIAGNOSIS — M7918 Myalgia, other site: Secondary | ICD-10-CM | POA: Diagnosis present

## 2020-01-16 DIAGNOSIS — Z79899 Other long term (current) drug therapy: Secondary | ICD-10-CM

## 2020-01-16 DIAGNOSIS — M25572 Pain in left ankle and joints of left foot: Secondary | ICD-10-CM | POA: Insufficient documentation

## 2020-01-16 DIAGNOSIS — K5903 Drug induced constipation: Secondary | ICD-10-CM | POA: Insufficient documentation

## 2020-01-16 DIAGNOSIS — M8949 Other hypertrophic osteoarthropathy, multiple sites: Secondary | ICD-10-CM | POA: Diagnosis present

## 2020-01-16 DIAGNOSIS — M25561 Pain in right knee: Secondary | ICD-10-CM | POA: Insufficient documentation

## 2020-01-16 DIAGNOSIS — M545 Low back pain, unspecified: Secondary | ICD-10-CM

## 2020-01-16 DIAGNOSIS — M159 Polyosteoarthritis, unspecified: Secondary | ICD-10-CM

## 2020-01-16 DIAGNOSIS — M25562 Pain in left knee: Secondary | ICD-10-CM | POA: Insufficient documentation

## 2020-01-16 DIAGNOSIS — T402X5A Adverse effect of other opioids, initial encounter: Secondary | ICD-10-CM

## 2020-01-16 DIAGNOSIS — M150 Primary generalized (osteo)arthritis: Secondary | ICD-10-CM

## 2020-01-16 MED ORDER — HYDROCODONE-ACETAMINOPHEN 10-325 MG PO TABS: 1.0000 | ORAL_TABLET | Freq: Four times a day (QID) | ORAL | 0 refills | Status: DC | PRN

## 2020-01-16 NOTE — Progress Notes (Signed)
Nursing Pain Medication Assessment:  Safety precautions to be maintained throughout the outpatient stay will include: orient to surroundings, keep bed in low position, maintain call bell within reach at all times, provide assistance with transfer out of bed and ambulation.  Medication Inspection Compliance: Pill count conducted under aseptic conditions, in front of the patient. Neither the pills nor the bottle was removed from the patient's sight at any time. Once count was completed pills were immediately returned to the patient in their original bottle.  Medication: Hydrocodone/APAP Pill/Patch Count: 41.5 of 120 pills remain Pill/Patch Appearance: Markings consistent with prescribed medication Bottle Appearance: Standard pharmacy container. Clearly labeled. Filled Date: 09 / 03/ 2021 Last Medication intake:  Today

## 2020-01-16 NOTE — Progress Notes (Signed)
PROVIDER NOTE: Information contained herein reflects review and annotations entered in association with encounter. Interpretation of such information and data should be left to medically-trained personnel. Information provided to patient can be located elsewhere in the medical record under "Patient Instructions". Document created using STT-dictation technology, any transcriptional errors that may result from process are unintentional.    Patient: Alison Cox  Service Category: E/M  Provider: Gillis Santa, MD  DOB: 19-Mar-1956  DOS: 01/16/2020  Specialty: Interventional Pain Management  MRN: [id number]  Setting: Ambulatory outpatient  PCP: Lajean Manes, MD  Type: Established Patient    Referring Provider: Lajean Manes, MD  Location: Office  Delivery: Face-to-face     HPI  Reason for encounter: [name], a 64 y.o. year old female, is here today for evaluation and management of her Pharmacologic therapy [Z79.899]. [name] primary complain today is Knee Pain (bilateral, left is worse) Last encounter: Practice (01/14/2020).  Pertinent problems: [name] has Long term current use of opiate analgesic; Long term prescription opiate use; Opiate use (40 MME/Day); Opioid dependence ([place]); Lumbar spondylosis; Grade 1 Anterolisthesis of L4 over L5; Chronic knee pain (Primary Area of Pain) (Bilateral) (R>L); Osteoarthritis of knees (Location of Primary Source of Pain) (Bilateral) (R>L); History of Hashimoto thyroiditis; Osteoarthrosis; Spondylolysis of lumbosacral region (Advanced disc degeneration and spondylosis L5-S1); Osteoarthritis of ankles (Location of Secondary source of pain) (Bilateral) (L>R); Chronic low back pain (Third area of Pain) (midline); Chronic pain syndrome; Musculoskeletal pain; Chronic ankle pain (Secondary area of Pain) (Bilateral) (L>R); and Osteoarthritis involving multiple joints on their pertinent problem list. Pain Assessment: Severity of Chronic pain is reported as a 3 /10.  Location: Knee Right, Left/denies. Onset: More than a month ago. Quality: Aching, Sharp. Timing: Constant. Modifying factor(s): rest. Vitals:  height is '5\' 3"'  (1.6 m) and weight is 295 lb (133.8 kg). Her blood pressure is 149/97 (abnormal) and her pulse is 113 (abnormal). Her respiration is 20 and oxygen saturation is 97%.   PCP has increased Lasix and add on Spironolactone for help with LE swelling and blood pressure No change in medical history since last visit.  Patient's pain is at baseline.  Patient continues multimodal pain regimen as prescribed.  States that it provides pain relief and improvement in functional status. Patient is complaining of increased pain with weightbearing due to knee osteoarthritis.  Of note she has gained weight since her last visit.   Pharmacotherapy Assessment   12/23/2019  1   10/20/2019  Hydrocodone-Acetamin 10-325 MG  120.00  30 Fr Nav   [id number]   Nor (2918)   0/0  40.00 MME  Comm Ins   [place]     Analgesic: Hydrocodone 10 mg 4 times daily as needed, quantity 120/month MME equals 40    Monitoring: [place] PMP: PDMP not reviewed this encounter.       Pharmacotherapy: No side-effects or adverse reactions reported. Compliance: No problems identified. Effectiveness: Clinically acceptable.  Dewayne Shorter, RN  01/16/2020  1:20 PM  Signed Nursing Pain Medication Assessment:  Safety precautions to be maintained throughout the outpatient stay will include: orient to surroundings, keep bed in low position, maintain call bell within reach at all times, provide assistance with transfer out of bed and ambulation.  Medication Inspection Compliance: Pill count conducted under aseptic conditions, in front of the patient. Neither the pills nor the bottle was removed from the patient's sight at any time. Once count was completed pills were immediately returned to the patient in  their original bottle.  Medication: Hydrocodone/APAP Pill/Patch Count: 41.5 of 120 pills  remain Pill/Patch Appearance: Markings consistent with prescribed medication Bottle Appearance: Standard pharmacy container. Clearly labeled. Filled Date: 09 / 03/ 2021 Last Medication intake:  Today    UDS:  Summary  Date Value Ref Range Status  01/10/2020 Note  Final    Comment:    ==================================================================== [facility] Select 13 (MW) ==================================================================== Test                             Result       Flag       Units  Drug Present and Declared for Prescription Verification   Hydrocodone                    2596         EXPECTED   ng/mg creat   Hydromorphone                  610          EXPECTED   ng/mg creat   Dihydrocodeine                 621          EXPECTED   ng/mg creat   Norhydrocodone                 1640         EXPECTED   ng/mg creat    Sources of hydrocodone include scheduled prescription medications.    Hydromorphone, dihydrocodeine and norhydrocodone are expected    metabolites of hydrocodone. Hydromorphone and dihydrocodeine are    also available as scheduled prescription medications.  ==================================================================== Test                      Result    Flag   Units      Ref Range   Creatinine              298              mg/dL      >=20 ==================================================================== Declared Medications:  The flagging and interpretation on this report are based on the  following declared medications.  Unexpected results may arise from  inaccuracies in the declared medications.   **Note: The testing scope of this panel includes these medications:   Hydrocodone ([place])   **Note: The testing scope of this panel does not include the  following reported medications:   Acetaminophen (Norco)  Amlodipine  Calcium  Cholecalciferol  Ciclopirox  Crisaborole  Difluprednate  Doxycycline  Efinaconazole (Jublia)   Fluocinolone  Furosemide  Hydrochlorothiazide  Ivermectin  Levothyroxine  Magnesium (Mag-Ox)  Meloxicam  Nystatin  Potassium Chloride  Prednisone  Spironolactone  Supplement  Topical  Triamcinolone ==================================================================== For clinical consultation, please call [phone]. ====================================================================      ROS  Constitutional: Denies any fever or chills Gastrointestinal: No reported hemesis, hematochezia, vomiting, or acute GI distress Musculoskeletal: Denies any acute onset joint swelling, redness, loss of ROM, or weakness Neurological: No reported episodes of acute onset apraxia, aphasia, dysarthria, agnosia, amnesia, paralysis, loss of coordination, or loss of consciousness   Medication Review  Benefiber, Cholecalciferol, Ciclopirox, Crisaborole, Difluprednate, Doxycycline Hyclate, Efinaconazole, Fluocinolone Acetonide, Fluocinolone Acetonide Body, HYDROcodone-acetaminophen, Ivermectin, Magnesium Oxide, Potassium Chloride CR, Urea, amLODipine, calcium carbonate, doxycycline, furosemide, hydrochlorothiazide, levothyroxine, meloxicam, nystatin-triamcinolone, prednisoLONE acetate, and spironolactone  History Review  Allergy: [name]  is allergic to penicillins, shellfish allergy, and sulfa antibiotics. Drug: [name]  reports no history of drug use. Alcohol:  reports no history of alcohol use. Tobacco:  reports that she has never smoked. She has never used smokeless tobacco. Social: [name]  reports that she has never smoked. She has never used smokeless tobacco. She reports that she does not drink alcohol and does not use drugs. Medical:  has a past medical history of History of Bell's palsy (03/06/2015), History of Hashimoto thyroiditis (03/06/2015), History of nephrolithiasis (03/06/2015), Hypertension, Kidney stone, Macular corneal dystrophy (2005), Osteoarthritis, and Thyroid  disease. Surgical: [name]  has a past surgical history that includes Endometrial ablation; DILATATION AND CURETTAGE; Corneal transplant; and Corneal transplant. Family: family history includes Hypertension in her father and mother.  Laboratory Chemistry Profile   Renal Lab Results  Component Value Date   BUN 14 01/17/2019   CREATININE 1.00 01/17/2019   BCR 14 01/17/2019   GFRAA 70 01/17/2019   GFRNONAA 61 01/17/2019     Hepatic Lab Results  Component Value Date   AST 32 01/17/2019   ALT 47 05/01/2016   ALBUMIN 4.8 01/17/2019   ALKPHOS 112 01/17/2019     Electrolytes Lab Results  Component Value Date   NA 141 01/17/2019   K 4.2 01/17/2019   CL 100 01/17/2019   CALCIUM 10.5 (H) 01/17/2019   MG 2.1 01/17/2019     Bone Lab Results  Component Value Date   25OHVITD1 36 01/17/2019   25OHVITD2 <1.0 01/17/2019   25OHVITD3 35 01/17/2019     Inflammation (CRP: Acute Phase) (ESR: Chronic Phase) Lab Results  Component Value Date   CRP 11 (H) 01/17/2019   ESRSEDRATE 39 01/17/2019       Note: Above Lab results reviewed.  Recent Imaging Review  DG Chest 2 View CLINICAL DATA:  Shortness of breath for several weeks  EXAM: CHEST - 2 VIEW  COMPARISON:  9/2/9  FINDINGS: The heart size and mediastinal contours are within normal limits. Both lungs are clear. The visualized skeletal structures are unremarkable.  IMPRESSION: No active cardiopulmonary disease.  Electronically Signed   By: Inez Catalina M.D.   On: 09/16/2019 08:40 Note: Reviewed        Physical Exam  General appearance: alert, cooperative and morbidly obese Mental status: Alert, oriented x 3 (person, place, & time)       Respiratory: No evidence of acute respiratory distress Eyes: PERLA Vitals: BP (!) 149/97   Pulse (!) 113   Resp 20   Ht '5\' 3"'  (1.6 m)   Wt 295 lb (133.8 kg)   SpO2 97%   BMI 52.26 kg/m  BMI: Estimated body mass index is 52.26 kg/m as calculated from the following:    Height as of this encounter: '5\' 3"'  (1.6 m).   Weight as of this encounter: 295 lb (133.8 kg). Ideal: Ideal body weight: 52.4 kg (115 lb 8.3 oz) Adjusted ideal body weight: 85 kg (187 lb 5 oz)  Morbidly obese, no acute distress No increased work of breathing, Positive low back pain, limited range of motion, pain with lumbar extension Bilateral knee pain, pain with weightbearing, limited range of motion of bilateral knee Antalgic gait   Assessment   Status Diagnosis  Controlled Controlled Controlled 1. Pharmacologic therapy   2. Chronic pain syndrome   3. Osteoarthritis involving multiple joints   4. Therapeutic opioid-induced constipation (OIC)   5. Chronic knee pain (Primary Area of Pain) (Bilateral) (R>L)  6. Chronic ankle pain (Secondary area of Pain) (Bilateral) (L>R)   7. Chronic low back pain (Third area of Pain) (midline)   8. Musculoskeletal pain   9. Disorder of skeletal system      Updated Problems: Problem  Osteoarthritis involving multiple joints  Musculoskeletal Pain  Chronic ankle pain (Secondary area of Pain) (Bilateral) (L>R)  Chronic Pain Syndrome  Osteoarthritis of ankles (Location of Secondary source of pain) (Bilateral) (L>R)  Chronic low back pain (Third area of Pain) (midline)  Long Term Current Use of Opiate Analgesic  Long Term Prescription Opiate Use  Opiate use (40 MME/Day)  Opioid Dependence ([facility])  Lumbar Spondylosis   Minimal anterior slip L4-5. Moderate to advanced disc degeneration and spondylosis L5 S1.   Grade 1 Anterolisthesis of L4 over L5  Chronic knee pain (Primary Area of Pain) (Bilateral) (R>L)  Osteoarthritis of knees (Location of Primary Source of Pain) (Bilateral) (R>L)  History of Hashimoto Thyroiditis  Osteoarthrosis  Spondylolysis of lumbosacral region (Advanced disc degeneration and spondylosis L5-S1)    Plan of Care   [name] has a current medication list which includes the following long-term medication(s):  amlodipine, calcium carbonate, cholecalciferol, furosemide, hydrochlorothiazide, [START ON 01/21/2020] hydrocodone-acetaminophen, [START ON 02/20/2020] hydrocodone-acetaminophen, [START ON 03/21/2020] hydrocodone-acetaminophen, levothyroxine, magnesium oxide, meloxicam, potassium chloride cr, spironolactone, and benefiber.  Pharmacotherapy (Medications Ordered): Meds ordered this encounter  Medications  . HYDROcodone-acetaminophen (NORCO) 10-325 MG tablet    Sig: Take 1 tablet by mouth every 6 (six) hours as needed for severe pain. Must last 30 days    Dispense:  120 tablet    Refill:  0    Chronic Pain: STOP Act (Not applicable) Fill 1 day early if closed on refill date.  [name] Kitchen HYDROcodone-acetaminophen (NORCO) 10-325 MG tablet    Sig: Take 1 tablet by mouth every 6 (six) hours as needed for severe pain. Must last 30 days    Dispense:  120 tablet    Refill:  0    Chronic Pain: STOP Act (Not applicable) Fill 1 day early if closed on refill date.  [name] Kitchen HYDROcodone-acetaminophen (NORCO) 10-325 MG tablet    Sig: Take 1 tablet by mouth every 6 (six) hours as needed for severe pain. Must last 30 days    Dispense:  120 tablet    Refill:  0    Chronic Pain: STOP Act (Not applicable) Fill 1 day early if closed on refill date.   Follow-up plan:   Return in about 3 months (around 04/16/2020) for Medication Management, in person.    Recent Visits Date Type Provider Dept  10/20/19 Telemedicine Milinda Pointer, MD [facility]  Showing recent visits within past 90 days and meeting all other requirements Today's Visits Date Type Provider Dept  01/16/20 Office Visit Gillis Santa, MD [facility]  Showing today's visits and meeting all other requirements Future Appointments Date Type Provider Dept  04/10/20 Appointment Gillis Santa, MD [facility]  Showing future appointments within next 90 days and meeting all other requirements  I discussed the assessment and treatment  plan with the patient. The patient was provided an opportunity to ask questions and all were answered. The patient agreed with the plan and demonstrated an understanding of the instructions.  Patient advised to call back or seek an in-person evaluation if the symptoms or condition worsens.  Duration of encounter: 30 minutes.  Note by: Gillis Santa, MD Date: 01/16/2020; Time: 1:47 PM

## 2020-01-18 ENCOUNTER — Ambulatory Visit: Payer: 59 | Admitting: Dermatology

## 2020-04-10 ENCOUNTER — Ambulatory Visit: Payer: 59 | Admitting: Student in an Organized Health Care Education/Training Program

## 2020-04-12 ENCOUNTER — Encounter: Payer: Self-pay | Admitting: Student in an Organized Health Care Education/Training Program

## 2020-04-12 ENCOUNTER — Other Ambulatory Visit: Payer: Self-pay

## 2020-04-12 ENCOUNTER — Other Ambulatory Visit: Payer: Self-pay | Admitting: Pain Medicine

## 2020-04-12 ENCOUNTER — Ambulatory Visit
Payer: 59 | Attending: Student in an Organized Health Care Education/Training Program | Admitting: Student in an Organized Health Care Education/Training Program

## 2020-04-12 VITALS — BP 155/84 | HR 104 | Temp 97.9°F | Resp 18 | Ht 63.0 in | Wt 300.0 lb

## 2020-04-12 DIAGNOSIS — M25561 Pain in right knee: Secondary | ICD-10-CM | POA: Diagnosis present

## 2020-04-12 DIAGNOSIS — T402X5A Adverse effect of other opioids, initial encounter: Secondary | ICD-10-CM | POA: Diagnosis present

## 2020-04-12 DIAGNOSIS — M25571 Pain in right ankle and joints of right foot: Secondary | ICD-10-CM | POA: Diagnosis present

## 2020-04-12 DIAGNOSIS — M47816 Spondylosis without myelopathy or radiculopathy, lumbar region: Secondary | ICD-10-CM | POA: Diagnosis present

## 2020-04-12 DIAGNOSIS — M25572 Pain in left ankle and joints of left foot: Secondary | ICD-10-CM | POA: Insufficient documentation

## 2020-04-12 DIAGNOSIS — K5903 Drug induced constipation: Secondary | ICD-10-CM | POA: Diagnosis not present

## 2020-04-12 DIAGNOSIS — G8929 Other chronic pain: Secondary | ICD-10-CM | POA: Diagnosis present

## 2020-04-12 DIAGNOSIS — M159 Polyosteoarthritis, unspecified: Secondary | ICD-10-CM

## 2020-04-12 DIAGNOSIS — M8949 Other hypertrophic osteoarthropathy, multiple sites: Secondary | ICD-10-CM | POA: Insufficient documentation

## 2020-04-12 DIAGNOSIS — Z79891 Long term (current) use of opiate analgesic: Secondary | ICD-10-CM | POA: Diagnosis present

## 2020-04-12 DIAGNOSIS — G894 Chronic pain syndrome: Secondary | ICD-10-CM | POA: Diagnosis not present

## 2020-04-12 DIAGNOSIS — M25562 Pain in left knee: Secondary | ICD-10-CM | POA: Insufficient documentation

## 2020-04-12 DIAGNOSIS — Z79899 Other long term (current) drug therapy: Secondary | ICD-10-CM | POA: Diagnosis not present

## 2020-04-12 MED ORDER — HYDROCODONE-ACETAMINOPHEN 10-325 MG PO TABS: 1.0000 | ORAL_TABLET | Freq: Four times a day (QID) | ORAL | 0 refills | Status: DC | PRN

## 2020-04-12 NOTE — Patient Instructions (Signed)
Hydrocodone/APAP to last until 07/21/20 has been escribed to your pharmacy.

## 2020-04-12 NOTE — Progress Notes (Signed)
PROVIDER NOTE: Information contained herein reflects review and annotations entered in association with encounter. Interpretation of such information and data should be left to medically-trained personnel. Information provided to patient can be located elsewhere in the medical record under "Patient Instructions". Document created using STT-dictation technology, any transcriptional errors that may result from process are unintentional.    Patient: Alison Cox  Service Category: E/M  Provider: Gillis Santa, MD  DOB: 1955-11-24  DOS: 04/12/2020  Specialty: Interventional Pain Management  MRN: [id number]  Setting: Ambulatory outpatient  PCP: Alison Manes, MD  Type: Established Patient    Referring Provider: Lajean Manes, MD  Location: Office  Delivery: Face-to-face     HPI  [name], a 64 y.o. year old female, is here today because of her Chronic pain syndrome [G89.4]. Ms. [name] primary complain today is Knee Pain (bilat) Last encounter: My last encounter with her was on 04/10/2020. Pertinent problems: Ms. [name] has Long term current use of opiate analgesic; Long term prescription opiate use; Opiate use (40 MME/Day); Opioid dependence (Alison Cox); Lumbar spondylosis; Grade 1 Anterolisthesis of L4 over L5; Chronic knee pain (Primary Area of Pain) (Bilateral) (R>L); Osteoarthritis of knees (Location of Primary Source of Pain) (Bilateral) (R>L); History of Hashimoto thyroiditis; Osteoarthrosis; Spondylolysis of lumbosacral region (Advanced disc degeneration and spondylosis L5-S1); Osteoarthritis of ankles (Location of Secondary source of pain) (Bilateral) (L>R); Chronic low back pain (Third area of Pain) (midline); Chronic pain syndrome; Musculoskeletal pain; Chronic ankle pain (Secondary area of Pain) (Bilateral) (L>R); and Osteoarthritis involving multiple joints on their pertinent problem list. Pain Assessment: Severity of Chronic pain is reported as a 5 /10. Location: Knee Right,Left/denies. Onset:  More than a month ago. Quality: Aching. Timing: Constant. Modifying factor(s): sitting, meds. Vitals:  height is _0  (1.6 m) and weight is 300 lb (136.1 kg). Her temporal temperature is 97.9 F (36.6 C). Her blood pressure is 155/84 (abnormal) and her pulse is 104 (abnormal). Her respiration is 18 and oxygen saturation is 95%.   Reason for encounter: medication management.   Continues to have persistent bilateral knee pain, right greater than left.  She has tried and failed intra-articular steroid injections as well as viscosupplementation were helpful in the past.  She presents today for medication refill as below.  No significant changes in her medical history.  No side effects with current pharmacologic therapy.  Pharmacotherapy Assessment   Analgesic: Hydrocodone 10 mg 4 times daily as needed, quantity 120/month MME equals 40    Monitoring: [place] PMP: PDMP reviewed during this encounter.       Pharmacotherapy: No side-effects or adverse reactions reported. Compliance: No problems identified. Effectiveness: Clinically acceptable.  [name], RN  04/12/2020  9:31 AM  Sign when Signing Visit Nursing Pain Medication Assessment:  Safety precautions to be maintained throughout the outpatient stay will include: orient to surroundings, keep bed in low position, maintain call bell within reach at all times, provide assistance with transfer out of bed and ambulation.  Medication Inspection Compliance: Pill count conducted under aseptic conditions, in front of the patient. Neither the pills nor the bottle was removed from the patient's sight at any time. Once count was completed pills were immediately returned to the patient in their original bottle.  Medication: Hydrocodone/APAP Pill/Patch Count: 42 of 120 pills remain Pill/Patch Appearance: Markings consistent with prescribed medication Bottle Appearance: Standard pharmacy container. Clearly labeled. Filled Date: 36 / 03 / 2021 Last  Medication intake:  Today    UDS:  Summary  Date Value Ref Range Status  01/10/2020 Note  Final    Comment:    ==================================================================== [employer]ox[employer] Select 13 (MW) ==================================================================== Test                             Result       Flag       Units  Drug Present and Declared for Prescription Verification   Hydrocodone                    2596         EXPECTED   ng/mg creat   Hydromorphone                  610          EXPECTED   ng/mg creat   Dihydrocodeine                 621          EXPECTED   ng/mg creat   Norhydrocodone                 1640         EXPECTED   ng/mg creat    Sources of hydrocodone include scheduled prescription medications.    Hydromorphone, dihydrocodeine and norhydrocodone are expected    metabolites of hydrocodone. Hydromorphone and dihydrocodeine are    also available as scheduled prescription medications.  ==================================================================== Test                      Result    Flag   Units      Ref Range   Creatinine              298              mg/dL      >=20 ==================================================================== Declared Medications:  The flagging and interpretation on this report are based on the  following declared medications.  Unexpected results may arise from  inaccuracies in the declared medications.   **Note: The testing scope of this panel includes these medications:   Hydrocodone (Norco)   **Note: The testing scope of this panel does not include the  following reported medications:   Acetaminophen (Norco)  Amlodipine  Calcium  Cholecalciferol  Ciclopirox  Crisaborole  Difluprednate  Doxycycline  Efinaconazole (Jublia)  Fluocinolone  Furosemide  Hydrochlorothiazide  Ivermectin  Levothyroxine  Magnesium (Mag-Ox)  Meloxicam  Nystatin  Potassium Chloride  Prednisone  Spironolactone   Supplement  Topical  Triamcinolone ==================================================================== For clinical consultation, please call [phone]. ====================================================================      ROS  Constitutional: Denies any fever or chills Gastrointestinal: No reported hemesis, hematochezia, vomiting, or acute GI distress Musculoskeletal: b.l knee pain Neurological: No reported episodes of acute onset apraxia, aphasia, dysarthria, agnosia, amnesia, paralysis, loss of coordination, or loss of consciousness  Medication Review  Benefiber, Cholecalciferol, Ciclopirox, Crisaborole, Difluprednate, Doxycycline Hyclate, Efinaconazole, Fluocinolone Acetonide, Fluocinolone Acetonide Body, HYDROcodone-acetaminophen, Ivermectin, Magnesium Oxide, Potassium Chloride CR, Urea, amLODipine, calcium carbonate, doxycycline, furosemide, hydrochlorothiazide, levothyroxine, meloxicam, nystatin-triamcinolone, prednisoLONE acetate, spironolactone, and terbinafine  History Review  Allergy: Ms. Zellars is allergic to penicillins, shellfish allergy, and sulfa antibiotics. Drug: Ms. Lindell  reports no history of drug use. Alcohol:  reports no history of alcohol use. Tobacco:  reports that she has never smoked. She has never used smokeless tobacco. Social: Ms. Sayer  reports that she has never smoked. She has never used  smokeless tobacco. She reports that she does not drink alcohol and does not use drugs. Medical:  has a past medical history of History of Bell's palsy (03/06/2015), History of Hashimoto thyroiditis (03/06/2015), History of nephrolithiasis (03/06/2015), Hypertension, Kidney stone, Macular corneal dystrophy (2005), Osteoarthritis, and Thyroid disease. Surgical: Ms. Windholz  has a past surgical history that includes Endometrial ablation; DILATATION AND CURETTAGE; Corneal transplant; and Corneal transplant. Family: family history includes Hypertension in her father and  mother.  Laboratory Chemistry Profile   Renal Lab Results  Component Value Date   BUN 14 01/17/2019   CREATININE 1.00 01/17/2019   BCR 14 01/17/2019   GFRAA 70 01/17/2019   GFRNONAA 61 01/17/2019     Hepatic Lab Results  Component Value Date   AST 32 01/17/2019   ALT 47 05/01/2016   ALBUMIN 4.8 01/17/2019   ALKPHOS 112 01/17/2019     Electrolytes Lab Results  Component Value Date   NA 141 01/17/2019   K 4.2 01/17/2019   CL 100 01/17/2019   CALCIUM 10.5 (H) 01/17/2019   MG 2.1 01/17/2019     Bone Lab Results  Component Value Date   25OHVITD1 36 01/17/2019   25OHVITD2 <1.0 01/17/2019   25OHVITD3 35 01/17/2019     Inflammation (CRP: Acute Phase) (ESR: Chronic Phase) Lab Results  Component Value Date   CRP 11 (H) 01/17/2019   ESRSEDRATE 39 01/17/2019       Note: Above Lab results reviewed.  Recent Imaging Review  DG Chest 2 View CLINICAL DATA:  Shortness of breath for several weeks  EXAM: CHEST - 2 VIEW  COMPARISON:  9/2/9  FINDINGS: The heart size and mediastinal contours are within normal limits. Both lungs are clear. The visualized skeletal structures are unremarkable.  IMPRESSION: No active cardiopulmonary disease.  Electronically Signed   By: Inez Catalina M.D.   On: 09/16/2019 08:40 Note: Reviewed        Physical Exam  General appearance: Well nourished, well developed, and well hydrated. In no apparent acute distress Mental status: Alert, oriented x 3 (person, place, & time)       Respiratory: No evidence of acute respiratory distress Eyes: PERLA Vitals: BP (!) 155/84   Pulse (!) 104   Temp 97.9 F (36.6 C) (Temporal)   Resp 18   Ht _0  (1.6 m)   Wt 300 lb (136.1 kg)   SpO2 95%   BMI 53.14 kg/m  BMI: Estimated body mass index is 53.14 kg/m as calculated from the following:   Height as of this encounter: _1  (1.6 m).   Weight as of this encounter: 300 lb (136.1 kg). Ideal: Ideal body weight: 52.4 kg (115 lb 8.3  oz) Adjusted ideal body weight: 85.9 kg (189 lb 5 oz)  Bilateral knee pain, pain with weightbearing, limited range of motion of bilateral knee Antalgic gait  Assessment   Status Diagnosis  Controlled Controlled Controlled 1. Chronic pain syndrome   2. Pharmacologic therapy   3. Osteoarthritis involving multiple joints   4. Therapeutic opioid-induced constipation (OIC)   5. Chronic knee pain (Primary Area of Pain) (Bilateral) (R>L)   6. Chronic ankle pain (Secondary area of Pain) (Bilateral) (L>R)   7. Long term current use of opiate analgesic   8. Lumbar spondylosis       Plan of Care   Ms. DORRINE MONTONE has a current medication list which includes the following long-term medication(s): amlodipine, calcium carbonate, furosemide, hydrochlorothiazide, levothyroxine, magnesium oxide, meloxicam, potassium chloride cr,  spironolactone, benefiber, cholecalciferol, [START ON 04/22/2020] hydrocodone-acetaminophen, [START ON 05/22/2020] hydrocodone-acetaminophen, and [START ON 06/21/2020] hydrocodone-acetaminophen.  Pharmacotherapy (Medications Ordered): Meds ordered this encounter  Medications  . HYDROcodone-acetaminophen (NORCO) 10-325 MG tablet    Sig: Take 1 tablet by mouth every 6 (six) hours as needed for severe pain. Must last 30 days    Dispense:  120 tablet    Refill:  0    Chronic Pain: STOP Act (Not applicable) Fill 1 day early if closed on refill date.  [name] Kitchen HYDROcodone-acetaminophen (NORCO) 10-325 MG tablet    Sig: Take 1 tablet by mouth every 6 (six) hours as needed for severe pain. Must last 30 days    Dispense:  120 tablet    Refill:  0    Chronic Pain: STOP Act (Not applicable) Fill 1 day early if closed on refill date.  [name] Kitchen HYDROcodone-acetaminophen (NORCO) 10-325 MG tablet    Sig: Take 1 tablet by mouth every 6 (six) hours as needed for severe pain. Must last 30 days    Dispense:  120 tablet    Refill:  0    Chronic Pain: STOP Act (Not applicable) Fill 1 day early if closed on  refill date.   Follow-up plan:   Return in about 3 months (around 07/11/2020) for Medication Management, in person.   Recent Visits Date Type Provider Dept  04/10/20 Appointment Alison Santa, MD [facility]  01/16/20 Office Visit Alison Santa, MD [facility]  Showing recent visits within past 90 days and meeting all other requirements Today's Visits Date Type Provider Dept  04/12/20 Office Visit Alison Santa, MD [facility]  Showing today's visits and meeting all other requirements Future Appointments No visits were found meeting these conditions. Showing future appointments within next 90 days and meeting all other requirements  I discussed the assessment and treatment plan with the patient. The patient was provided an opportunity to ask questions and all were answered. The patient agreed with the plan and demonstrated an understanding of the instructions.  Patient advised to call back or seek an in-person evaluation if the symptoms or condition worsens.  Duration of encounter: 30 minutes.  Note by: Alison Santa, MD Date: 04/12/2020; Time: 9:43 AM

## 2020-04-12 NOTE — Progress Notes (Signed)
Nursing Pain Medication Assessment:  Safety precautions to be maintained throughout the outpatient stay will include: orient to surroundings, keep bed in low position, maintain call bell within reach at all times, provide assistance with transfer out of bed and ambulation.  Medication Inspection Compliance: Pill count conducted under aseptic conditions, in front of the patient. Neither the pills nor the bottle was removed from the patient's sight at any time. Once count was completed pills were immediately returned to the patient in their original bottle.  Medication: Hydrocodone/APAP Pill/Patch Count: 42 of 120 pills remain Pill/Patch Appearance: Markings consistent with prescribed medication Bottle Appearance: Standard pharmacy container. Clearly labeled. Filled Date: 54 / 03 / 2021 Last Medication intake:  Today

## 2020-07-10 ENCOUNTER — Ambulatory Visit
Payer: 59 | Attending: Student in an Organized Health Care Education/Training Program | Admitting: Student in an Organized Health Care Education/Training Program

## 2020-07-10 ENCOUNTER — Encounter: Payer: Self-pay | Admitting: Student in an Organized Health Care Education/Training Program

## 2020-07-10 DIAGNOSIS — G894 Chronic pain syndrome: Secondary | ICD-10-CM

## 2020-07-10 DIAGNOSIS — Z79891 Long term (current) use of opiate analgesic: Secondary | ICD-10-CM

## 2020-07-10 DIAGNOSIS — Z79899 Other long term (current) drug therapy: Secondary | ICD-10-CM

## 2020-07-10 DIAGNOSIS — M47816 Spondylosis without myelopathy or radiculopathy, lumbar region: Secondary | ICD-10-CM | POA: Diagnosis not present

## 2020-07-10 MED ORDER — HYDROCODONE-ACETAMINOPHEN 10-325 MG PO TABS: 1.0000 | ORAL_TABLET | Freq: Four times a day (QID) | ORAL | 0 refills | Status: DC | PRN

## 2020-07-10 NOTE — Progress Notes (Signed)
Patient: Alison Cox  Service Category: E/M  Provider: Gillis Santa, MD  DOB: Jun 27, 1955  DOS: 07/10/2020  Location: Office  MRN: [id number]  Setting: Ambulatory outpatient  Referring Provider: Lajean Manes, MD  Type: Established Patient  Specialty: Interventional Pain Management  PCP: Alison Manes, MD  Location: Home  Delivery: TeleHealth     Virtual Encounter - Pain Management PROVIDER NOTE: Information contained herein reflects review and annotations entered in association with encounter. Interpretation of such information and data should be left to medically-trained personnel. Information provided to patient can be located elsewhere in the medical record under "Patient Instructions". Document created using STT-dictation technology, any transcriptional errors that may result from process are unintentional.    Contact & Pharmacy Preferred: [place]: [phone] (home) Mobile: [phone] (mobile) E-mail: [email or web address]  [id number]: [phone]: [phone] [address] [place] [address]Phone: [phone]: [phone]  Pre-screening  Ms. FRosann Auerbachoffered "in-person" vs "virtual" encounter. She indicated preferring virtual for this encounter.   Reason COVID-19*  Social distancing based on CDC and AMA recommendations.   I contacted IHarlin Cox 07/10/2020 via video conference.      I clearly identified myself as BGillis Santa MD. I verified that I was speaking with the correct person using two identifiers (Name: Alison Cox and date of birth: [birth year].  Consent I sought verbal advanced consent from IHarlin Heysfor virtual visit interactions. I informed Ms. FCoglianeseof possible security and privacy concerns, risks, and limitations associated with providing "not-in-person" medical  evaluation and management services. I also informed Ms. FFoutzof the availability of "in-person" appointments. Finally, I informed her that there would be a charge for the virtual visit and that she could be  personally, fully or partially, financially responsible for it. Ms. FDejarnetteexpressed understanding and agreed to proceed.   Historic Elements   Ms. IJUDIA ARNOTTis a 65y.o. year old, female patient evaluated today after our last contact on 04/12/2020. Alison Cox has a past medical history of History of Bell's palsy (03/06/2015), History of Hashimoto thyroiditis (03/06/2015), History of nephrolithiasis (03/06/2015), Hypertension, Kidney stone, Macular corneal dystrophy (2005), Osteoarthritis, and Thyroid disease. She also  has a past surgical history that includes Endometrial ablation; DILATATION AND CURETTAGE; Corneal transplant; and Corneal transplant. Ms. FBoydenhas a current medication list which includes the following prescription(s): amlodipine, calcium carbonate, cholecalciferol, ciclopirox, crisaborole, difluprednate, doxycycline, doxycycline hyclate, jublia, fluocinolone acetonide, fluocinolone acetonide body, furosemide, hydrochlorothiazide, [START ON 07/22/2020] hydrocodone-acetaminophen, ivermectin, levothyroxine, magnesium oxide, meloxicam, nystatin-triamcinolone, potassium chloride cr, prednisolone acetate, spironolactone, terbinafine, urea, and benefiber. She  reports that she has never smoked. She has never used smokeless tobacco. She reports that she does not drink alcohol and does not use drugs. Alison Cox allergic to penicillins, shellfish allergy, and sulfa antibiotics.   HPI  Today, she is being contacted for medication management.   Patient has bronchitis and is unable to come in for her medication refill visit today.  Otherwise no significant change in her medical history.  Continues hydrocodone as prescribed.  No change in dose.  States that it provides analgesic benefit as well as  functional improvement to allow her to complete ADLs more comfortably.  Pharmacotherapy Assessment  Analgesic: Hydrocodone 10 mg 4 times daily as needed, quantity 120/month MME equals 40  Monitoring: [place] PMP: PDMP reviewed during this encounter.       Pharmacotherapy: No side-effects or adverse reactions reported. Compliance: No problems identified. Effectiveness: Clinically acceptable. Plan: Refer to "POC".  UDS:  Summary  Date Value Ref Range Status  01/10/2020 Note  Final    Comment:    ==================================================================== [employer] Select 13 (MW) ==================================================================== Test                             Result       Flag       Units  Drug Present and Declared for Prescription Verification   Hydrocodone                    2596         EXPECTED   ng/mg creat   Hydromorphone                  610          EXPECTED   ng/mg creat   Dihydrocodeine                 621          EXPECTED   ng/mg creat   Norhydrocodone                 1640         EXPECTED   ng/mg creat    Sources of hydrocodone include scheduled prescription medications.    Hydromorphone, dihydrocodeine and norhydrocodone are expected    metabolites of hydrocodone. Hydromorphone and dihydrocodeine are    also available as scheduled prescription medications.  ==================================================================== Test                      Result    Flag   Units      Ref Range   Creatinine              298              mg/dL      >=20 ==================================================================== Declared Medications:  The flagging and interpretation on this report are based on the  following declared medications.  Unexpected results may arise from  inaccuracies in the declared medications.   **Note: The testing scope of this panel includes these medications:   Hydrocodone (Norco)   **Note: The testing scope of this  panel does not include the  following reported medications:   Acetaminophen (Norco)  Amlodipine  Calcium  Cholecalciferol  Ciclopirox  Crisaborole  Difluprednate  Doxycycline  Efinaconazole (Jublia)  Fluocinolone  Furosemide  Hydrochlorothiazide  Ivermectin  Levothyroxine  Magnesium (Mag-Ox)  Meloxicam  Nystatin  Potassium Chloride  Prednisone  Spironolactone  Supplement  Topical  Triamcinolone ==================================================================== For clinical consultation, please call [phone]. ====================================================================     Laboratory Chemistry Profile   Renal Lab Results  Component Value Date   BUN 14 01/17/2019   CREATININE 1.00 01/17/2019   BCR 14 01/17/2019   GFRAA 70 01/17/2019   GFRNONAA 61 01/17/2019     Hepatic Lab Results  Component Value Date   AST 32 01/17/2019   ALT 47 05/01/2016   ALBUMIN 4.8 01/17/2019   ALKPHOS 112 01/17/2019     Electrolytes Lab Results  Component Value Date   NA 141 01/17/2019   K 4.2 01/17/2019   CL 100 01/17/2019   CALCIUM 10.5 (H) 01/17/2019   MG 2.1 01/17/2019  Bone Lab Results  Component Value Date   25OHVITD1 36 01/17/2019   25OHVITD2 <1.0 01/17/2019   25OHVITD3 35 01/17/2019     Inflammation (CRP: Acute Phase) (ESR: Chronic Phase) Lab Results  Component Value Date   CRP 11 (H) 01/17/2019   ESRSEDRATE 39 01/17/2019       Note: Above Lab results reviewed.  Imaging  DG Chest 2 View CLINICAL DATA:  Shortness of breath for several weeks  EXAM: CHEST - 2 VIEW  COMPARISON:  9/2/9  FINDINGS: The heart size and mediastinal contours are within normal limits. Both lungs are clear. The visualized skeletal structures are unremarkable.  IMPRESSION: No active cardiopulmonary disease.  Electronically Signed   By: Inez Catalina M.D.   On: 09/16/2019 08:40  Assessment  Diagnoses of Chronic pain syndrome, Pharmacologic therapy,  Long term current use of opiate analgesic, and Lumbar spondylosis were pertinent to this visit.  Plan of Care  Problem-specific:  No problem-specific Assessment & Plan notes found for this encounter.  Alison Cox has a current medication list which includes the following long-term medication(s): amlodipine, calcium carbonate, cholecalciferol, furosemide, hydrochlorothiazide, [START ON 07/22/2020] hydrocodone-acetaminophen, levothyroxine, magnesium oxide, meloxicam, potassium chloride cr, spironolactone, and benefiber.  Pharmacotherapy (Medications Ordered): Meds ordered this encounter  Medications  . HYDROcodone-acetaminophen (NORCO) 10-325 MG tablet    Sig: Take 1 tablet by mouth every 6 (six) hours as needed for severe pain. Must last 30 days    Dispense:  120 tablet    Refill:  0    Chronic Pain: STOP Act (Not applicable) Fill 1 day early if closed on refill date.   Orders:  No orders of the defined types were placed in this encounter.  Follow-up plan:   Return in about 6 weeks (around 08/21/2020) for Medication Management, in person.   Recent Visits Date Type Provider Dept  04/12/20 Office Visit Alison Santa, MD [facility]  Showing recent visits within past 90 days and meeting all other requirements Today's Visits Date Type Provider Dept  07/10/20 Telemedicine Alison Santa, MD [facility]  Showing today's visits and meeting all other requirements Future Appointments No visits were found meeting these conditions. Showing future appointments within next 90 days and meeting all other requirements  I discussed the assessment and treatment plan with the patient. The patient was provided an opportunity to ask questions and all were answered. The patient agreed with the plan and demonstrated an understanding of the instructions.  Patient advised to call back or seek an in-person evaluation if the symptoms or condition worsens.  Duration of encounter: 30  minutes.  Note by: Alison Santa, MD Date: 07/10/2020; Time: 10:01 AM

## 2020-08-14 ENCOUNTER — Other Ambulatory Visit: Payer: Self-pay

## 2020-08-14 ENCOUNTER — Ambulatory Visit
Payer: 59 | Attending: Student in an Organized Health Care Education/Training Program | Admitting: Student in an Organized Health Care Education/Training Program

## 2020-08-14 ENCOUNTER — Encounter: Payer: Self-pay | Admitting: Student in an Organized Health Care Education/Training Program

## 2020-08-14 VITALS — BP 147/86 | HR 101 | Temp 98.4°F | Resp 18 | Ht 64.0 in | Wt 300.0 lb

## 2020-08-14 DIAGNOSIS — Z79891 Long term (current) use of opiate analgesic: Secondary | ICD-10-CM

## 2020-08-14 DIAGNOSIS — M8949 Other hypertrophic osteoarthropathy, multiple sites: Secondary | ICD-10-CM | POA: Insufficient documentation

## 2020-08-14 DIAGNOSIS — M47816 Spondylosis without myelopathy or radiculopathy, lumbar region: Secondary | ICD-10-CM

## 2020-08-14 DIAGNOSIS — Z79899 Other long term (current) drug therapy: Secondary | ICD-10-CM

## 2020-08-14 DIAGNOSIS — M159 Polyosteoarthritis, unspecified: Secondary | ICD-10-CM

## 2020-08-14 DIAGNOSIS — G894 Chronic pain syndrome: Secondary | ICD-10-CM | POA: Diagnosis present

## 2020-08-14 DIAGNOSIS — M150 Primary generalized (osteo)arthritis: Secondary | ICD-10-CM

## 2020-08-14 MED ORDER — HYDROCODONE-ACETAMINOPHEN 10-325 MG PO TABS: 1.0000 | ORAL_TABLET | Freq: Four times a day (QID) | ORAL | 0 refills | Status: DC | PRN

## 2020-08-14 MED ORDER — MELOXICAM 15 MG PO TABS: 15.0000 mg | ORAL_TABLET | Freq: Every day | ORAL | 1 refills | Status: DC

## 2020-08-14 NOTE — Progress Notes (Signed)
Nursing Pain Medication Assessment:  Safety precautions to be maintained throughout the outpatient stay will include: orient to surroundings, keep bed in low position, maintain call bell within reach at all times, provide assistance with transfer out of bed and ambulation.  Medication Inspection Compliance: Pill count conducted under aseptic conditions, in front of the patient. Neither the pills nor the bottle was removed from the patient's sight at any time. Once count was completed pills were immediately returned to the patient in their original bottle.  Medication: Hydrocodone/APAP Pill/Patch Count: 30.5 of 120 pills remain Pill/Patch Appearance: Markings consistent with prescribed medication Bottle Appearance: Standard pharmacy container. Clearly labeled. Filled Date: 04 / 03 / 2022 Last Medication intake:  Today

## 2020-08-14 NOTE — Progress Notes (Signed)
PROVIDER NOTE: Information contained herein reflects review and annotations entered in association with encounter. Interpretation of such information and data should be left to medically-trained personnel. Information provided to patient can be located elsewhere in the medical record under "Patient Instructions". Document created using STT-dictation technology, any transcriptional errors that may result from process are unintentional.    Patient: Alison Cox  Service Category: E/M  Provider: Gillis Santa, MD  DOB: April 12, 1956  DOS: 08/14/2020  Specialty: Interventional Pain Management  MRN: [id number]  Setting: Ambulatory outpatient  PCP: Lajean Manes, MD  Type: Established Patient    Referring Provider: Lajean Manes, MD  Location: Office  Delivery: Face-to-face     HPI  [name], a 65 y.o. year old female, is here today because of her Chronic pain syndrome [G89.4]. Ms. Slight primary complain today is Knee Pain ([place], left is worse) Last encounter: My last encounter with her was on 04/12/2020. Pertinent problems: Ms. Wroblewski has Long term current use of opiate analgesic; Long term prescription opiate use; Opiate use (40 MME/Day); Opioid dependence ([place]); Lumbar spondylosis; Grade 1 Anterolisthesis of L4 over L5; Chronic knee pain (Primary Area of Pain) (Bilateral) (R>L); Osteoarthritis of knees (Location of Primary Source of Pain) (Bilateral) (R>L); History of Hashimoto thyroiditis; Osteoarthrosis; Spondylolysis of lumbosacral region (Advanced disc degeneration and spondylosis L5-S1); Osteoarthritis of ankles (Location of Secondary source of pain) (Bilateral) (L>R); Chronic low back pain (Third area of Pain) (midline); Chronic pain syndrome; Musculoskeletal pain; Chronic ankle pain (Secondary area of Pain) (Bilateral) (L>R); and Osteoarthritis involving multiple joints on their pertinent problem list. Pain Assessment: Severity of Chronic pain is reported as a 5 /10. Location: Knee Right,Left  (left is worse)/denies. Onset: More than a month ago. Quality: Aching,Burning. Timing: Intermittent. Modifying factor(s): meds. Vitals:  height is '5\' 4"'  (1.626 m) and weight is 300 lb (136.1 kg). Her temporal temperature is 98.4 F (36.9 C). Her blood pressure is 147/86 (abnormal) and her pulse is 101 (abnormal). Her respiration is 18 and oxygen saturation is 97%.   Reason for encounter: medication management.   No change in medical history since last visit.  Patient's pain is at baseline.  Patient continues multimodal pain regimen as prescribed.  States that it provides pain relief and improvement in functional status.  Pharmacotherapy Assessment   Analgesic: Hydrocodone 10 mg 4 times daily as needed, quantity 120/month MME equals 40    Monitoring: [place] PMP: PDMP reviewed during this encounter.       Pharmacotherapy: No side-effects or adverse reactions reported. Compliance: No problems identified. Effectiveness: Clinically acceptable.  [name], RN  08/14/2020  9:25 AM  Sign when Signing Visit Nursing Pain Medication Assessment:  Safety precautions to be maintained throughout the outpatient stay will include: orient to surroundings, keep bed in low position, maintain call bell within reach at all times, provide assistance with transfer out of bed and ambulation.  Medication Inspection Compliance: Pill count conducted under aseptic conditions, in front of the patient. Neither the pills nor the bottle was removed from the patient's sight at any time. Once count was completed pills were immediately returned to the patient in their original bottle.  Medication: Hydrocodone/APAP Pill/Patch Count: 30.5 of 120 pills remain Pill/Patch Appearance: Markings consistent with prescribed medication Bottle Appearance: Standard pharmacy container. Clearly labeled. Filled Date: 04 / 03 / 2022 Last Medication intake:  Today    UDS:  Summary  Date Value Ref Range Status  01/10/2020 Note  Final     Comment:    ====================================================================  [facility] Select 13 (MW) ==================================================================== Test                             Result       Flag       Units  Drug Present and Declared for Prescription Verification   Hydrocodone                    2596         EXPECTED   ng/mg creat   Hydromorphone                  610          EXPECTED   ng/mg creat   Dihydrocodeine                 621          EXPECTED   ng/mg creat   Norhydrocodone                 1640         EXPECTED   ng/mg creat    Sources of hydrocodone include scheduled prescription medications.    Hydromorphone, dihydrocodeine and norhydrocodone are expected    metabolites of hydrocodone. Hydromorphone and dihydrocodeine are    also available as scheduled prescription medications.  ==================================================================== Test                      Result    Flag   Units      Ref Range   Creatinine              298              mg/dL      >=20 ==================================================================== Declared Medications:  The flagging and interpretation on this report are based on the  following declared medications.  Unexpected results may arise from  inaccuracies in the declared medications.   **Note: The testing scope of this panel includes these medications:   Hydrocodone (Norco)   **Note: The testing scope of this panel does not include the  following reported medications:   Acetaminophen (Norco)  Amlodipine  Calcium  Cholecalciferol  Ciclopirox  Crisaborole  Difluprednate  Doxycycline  Efinaconazole ([place])  Fluocinolone  Furosemide  Hydrochlorothiazide  Ivermectin  Levothyroxine  Magnesium (Mag-Ox)  Meloxicam  Nystatin  Potassium Chloride  Prednisone  Spironolactone  Supplement  Topical  Triamcinolone ==================================================================== For  clinical consultation, please call [phone]. ====================================================================      ROS  Constitutional: Denies any fever or chills Gastrointestinal: No reported hemesis, hematochezia, vomiting, or acute GI distress Musculoskeletal: Denies any acute onset joint swelling, redness, loss of ROM, or weakness Neurological: No reported episodes of acute onset apraxia, aphasia, dysarthria, agnosia, amnesia, paralysis, loss of coordination, or loss of consciousness  Medication Review  Benefiber, Cholecalciferol, Ciclopirox, Crisaborole, Difluprednate, Doxycycline Hyclate, Efinaconazole, Fluocinolone Acetonide, Fluocinolone Acetonide Body, HYDROcodone-acetaminophen, Ivermectin, Magnesium Oxide, Potassium Chloride CR, Urea, amLODipine, calcium carbonate, doxycycline, furosemide, hydrochlorothiazide, levothyroxine, meloxicam, nystatin-triamcinolone, prednisoLONE acetate, spironolactone, and terbinafine  History Review  Allergy: Ms. Oconnor is allergic to penicillins, shellfish allergy, and sulfa antibiotics. Drug: Ms. Ghazi  reports no history of drug use. Alcohol:  reports no history of alcohol use. Tobacco:  reports that she has never smoked. She has never used smokeless tobacco. Social: Ms. Brannum  reports that she has never smoked. She has never used smokeless tobacco. She reports that she does not drink  alcohol and does not use drugs. Medical:  has a past medical history of History of Bell's palsy (03/06/2015), History of Hashimoto thyroiditis (03/06/2015), History of nephrolithiasis (03/06/2015), Hypertension, Kidney stone, Macular corneal dystrophy (2005), Osteoarthritis, and Thyroid disease. Surgical: Ms. Manolis  has a past surgical history that includes Endometrial ablation; DILATATION AND CURETTAGE; Corneal transplant; and Corneal transplant. Family: family history includes Hypertension in her father and mother.  Laboratory Chemistry Profile   Renal Lab  Results  Component Value Date   BUN 14 01/17/2019   CREATININE 1.00 01/17/2019   BCR 14 01/17/2019   GFRAA 70 01/17/2019   GFRNONAA 61 01/17/2019     Hepatic Lab Results  Component Value Date   AST 32 01/17/2019   ALT 47 05/01/2016   ALBUMIN 4.8 01/17/2019   ALKPHOS 112 01/17/2019     Electrolytes Lab Results  Component Value Date   NA 141 01/17/2019   K 4.2 01/17/2019   CL 100 01/17/2019   CALCIUM 10.5 (H) 01/17/2019   MG 2.1 01/17/2019     Bone Lab Results  Component Value Date   25OHVITD1 36 01/17/2019   25OHVITD2 <1.0 01/17/2019   25OHVITD3 35 01/17/2019     Inflammation (CRP: Acute Phase) (ESR: Chronic Phase) Lab Results  Component Value Date   CRP 11 (H) 01/17/2019   ESRSEDRATE 39 01/17/2019       Note: Above Lab results reviewed.  Recent Imaging Review  DG Chest 2 View CLINICAL DATA:  Shortness of breath for several weeks  EXAM: CHEST - 2 VIEW  COMPARISON:  9/2/9  FINDINGS: The heart size and mediastinal contours are within normal limits. Both lungs are clear. The visualized skeletal structures are unremarkable.  IMPRESSION: No active cardiopulmonary disease.  Electronically Signed   By: Inez Catalina M.D.   On: 09/16/2019 08:40 Note: Reviewed        Physical Exam  General appearance: Well nourished, well developed, and well hydrated. In no apparent acute distress Mental status: Alert, oriented x 3 (person, place, & time)       Respiratory: No evidence of acute respiratory distress Eyes: PERLA Vitals: BP (!) 147/86   Pulse (!) 101   Temp 98.4 F (36.9 C) (Temporal)   Resp 18   Ht '5\' 4"'  (1.626 m)   Wt 300 lb (136.1 kg)   SpO2 97%   BMI 51.49 kg/m  BMI: Estimated body mass index is 51.49 kg/m as calculated from the following:   Height as of this encounter: '5\' 4"'  (1.626 m).   Weight as of this encounter: 300 lb (136.1 kg). Ideal: Ideal body weight: 54.7 kg (120 lb 9.5 oz) Adjusted ideal body weight: 87.3 kg (192 lb 5.7  oz)  Assessment   Status Diagnosis  Controlled Controlled Controlled 1. Chronic pain syndrome   2. Pharmacologic therapy   3. Long term current use of opiate analgesic   4. Lumbar spondylosis   5. Osteoarthritis involving multiple joints       Plan of Care   Ms. HADASAH BRUGGER has a current medication list which includes the following long-term medication(s): amlodipine, calcium carbonate, cholecalciferol, furosemide, hydrochlorothiazide, levothyroxine, magnesium oxide, potassium chloride cr, spironolactone, benefiber, [START ON 08/20/2020] hydrocodone-acetaminophen, [START ON 09/19/2020] hydrocodone-acetaminophen, [START ON 10/19/2020] hydrocodone-acetaminophen, and meloxicam.  Pharmacotherapy (Medications Ordered): Meds ordered this encounter  Medications  . HYDROcodone-acetaminophen (NORCO) 10-325 MG tablet    Sig: Take 1 tablet by mouth every 6 (six) hours as needed for severe pain. Must last 30 days  Dispense:  120 tablet    Refill:  0    Chronic Pain: STOP Act (Not applicable) Fill 1 day early if closed on refill date.  [name] Kitchen HYDROcodone-acetaminophen (NORCO) 10-325 MG tablet    Sig: Take 1 tablet by mouth every 6 (six) hours as needed for severe pain. Must last 30 days    Dispense:  120 tablet    Refill:  0    Chronic Pain: STOP Act (Not applicable) Fill 1 day early if closed on refill date.  [name] Kitchen HYDROcodone-acetaminophen (NORCO) 10-325 MG tablet    Sig: Take 1 tablet by mouth every 6 (six) hours as needed for severe pain. Must last 30 days    Dispense:  120 tablet    Refill:  0    Chronic Pain: STOP Act (Not applicable) Fill 1 day early if closed on refill date.  . meloxicam (MOBIC) 15 MG tablet    Sig: Take 1 tablet (15 mg total) by mouth daily.    Dispense:  90 tablet    Refill:  1    Fill one day early if pharmacy is closed on scheduled refill date. May substitute for generic if available.   Follow-up plan:   Return in about 3 months (around 11/13/2020) for Medication  Management, in person.   Recent Visits Date Type Provider Dept  07/10/20 Telemedicine Gillis Santa, MD [facility]  Showing recent visits within past 90 days and meeting all other requirements Today's Visits Date Type Provider Dept  08/14/20 Office Visit Gillis Santa, MD [facility]  Showing today's visits and meeting all other requirements Future Appointments No visits were found meeting these conditions. Showing future appointments within next 90 days and meeting all other requirements  I discussed the assessment and treatment plan with the patient. The patient was provided an opportunity to ask questions and all were answered. The patient agreed with the plan and demonstrated an understanding of the instructions.  Patient advised to call back or seek an in-person evaluation if the symptoms or condition worsens.  Duration of encounter: 30 minutes.  Note by: Gillis Santa, MD Date: 08/14/2020; Time: 9:57 AM

## 2020-11-13 ENCOUNTER — Other Ambulatory Visit: Payer: Self-pay

## 2020-11-13 ENCOUNTER — Ambulatory Visit
Payer: 59 | Attending: Student in an Organized Health Care Education/Training Program | Admitting: Student in an Organized Health Care Education/Training Program

## 2020-11-13 ENCOUNTER — Encounter: Payer: Self-pay | Admitting: Student in an Organized Health Care Education/Training Program

## 2020-11-13 VITALS — BP 153/74 | HR 103 | Temp 97.1°F | Resp 16 | Ht 63.0 in | Wt 300.0 lb

## 2020-11-13 DIAGNOSIS — M47816 Spondylosis without myelopathy or radiculopathy, lumbar region: Secondary | ICD-10-CM | POA: Insufficient documentation

## 2020-11-13 DIAGNOSIS — G894 Chronic pain syndrome: Secondary | ICD-10-CM | POA: Diagnosis not present

## 2020-11-13 DIAGNOSIS — Z79899 Other long term (current) drug therapy: Secondary | ICD-10-CM | POA: Insufficient documentation

## 2020-11-13 DIAGNOSIS — Z79891 Long term (current) use of opiate analgesic: Secondary | ICD-10-CM | POA: Insufficient documentation

## 2020-11-13 MED ORDER — HYDROCODONE-ACETAMINOPHEN 10-325 MG PO TABS: 1.0000 | ORAL_TABLET | Freq: Four times a day (QID) | ORAL | 0 refills | Status: DC | PRN

## 2020-11-13 NOTE — Progress Notes (Signed)
PROVIDER NOTE: Information contained herein reflects review and annotations entered in association with encounter. Interpretation of such information and data should be left to medically-trained personnel. Information provided to patient can be located elsewhere in the medical record under "Patient Instructions". Document created using STT-dictation technology, any transcriptional errors that may result from process are unintentional.    Patient: Alison Cox  Service Category: E/M  Provider: Gillis Santa, MD  DOB: Mar 26, 1956  DOS: 11/13/2020  Specialty: Interventional Pain Management  MRN: [id number]  Setting: Ambulatory outpatient  PCP: Lajean Manes, MD  Type: Established Patient    Referring Provider: Lajean Manes, MD  Location: Office  Delivery: Face-to-face     HPI  [name], a 65 y.o. year old female, is here today because of her Chronic pain syndrome [G89.4]. [name] primary complain today is Knee Pain (Bilateral ) and Ankle Pain (Bilateral ) Last encounter: My last encounter with her was on 08/14/20 Pertinent problems: [name] has Long term current use of opiate analgesic; Long term prescription opiate use; Opiate use (40 MME/Day); Opioid dependence ([place]); Lumbar spondylosis; Grade 1 Anterolisthesis of L4 over L5; Chronic knee pain (Primary Area of Pain) (Bilateral) (R>L); Osteoarthritis of knees (Location of Primary Source of Pain) (Bilateral) (R>L); History of Hashimoto thyroiditis; Osteoarthrosis; Spondylolysis of lumbosacral region (Advanced disc degeneration and spondylosis L5-S1); Osteoarthritis of ankles (Location of Secondary source of pain) (Bilateral) (L>R); Chronic low back pain (Third area of Pain) (midline); Chronic pain syndrome; Musculoskeletal pain; Chronic ankle pain (Secondary area of Pain) (Bilateral) (L>R); and Osteoarthritis involving multiple joints on their pertinent problem list. Pain Assessment: Severity of Chronic pain is reported as a 3 /10. Location: Knee  (ankles bilateral) Right, Left/knees down. Onset: More than a month ago. Quality: Discomfort, Constant, Dull, Aching, Sharp. Timing: Constant. Modifying factor(s): rest, medications. Vitals:  height is _0  (1.6 m) and weight is 300 lb (136.1 kg). Her temporal temperature is 97.1 F (36.2 C) (abnormal). Her blood pressure is 153/74 (abnormal) and her pulse is 103 (abnormal). Her respiration is 16 and oxygen saturation is 96%.   Reason for encounter: medication management.   No change in medical history since last visit.  Patient's pain is at baseline.  Patient continues multimodal pain regimen as prescribed.  States that it provides pain relief and improvement in functional status.  Pharmacotherapy Assessment   Analgesic: Hydrocodone 10 mg 4 times daily as needed, quantity 120/month MME equals 40    Monitoring: [place] PMP: PDMP reviewed during this encounter.       Pharmacotherapy: No side-effects or adverse reactions reported. Compliance: No problems identified. Effectiveness: Clinically acceptable.  Janett Billow, RN  11/13/2020 11:30 AM  Sign when Signing Visit Nursing Pain Medication Assessment:  Safety precautions to be maintained throughout the outpatient stay will include: orient to surroundings, keep bed in low position, maintain call bell within reach at all times, provide assistance with transfer out of bed and ambulation.  Medication Inspection Compliance: Pill count conducted under aseptic conditions, in front of the patient. Neither the pills nor the bottle was removed from the patient's sight at any time. Once count was completed pills were immediately returned to the patient in their original bottle.  Medication: Hydrocodone/APAP Pill/Patch Count:  33.5 of 120 pills remain Pill/Patch Appearance: Markings consistent with prescribed medication Bottle Appearance: Standard pharmacy container. Clearly labeled. Filled Date: 07 / 03 / 2022 Last Medication intake:  Today       UDS:  Summary  Date  Value Ref Range Status  01/10/2020 Note  Final    Comment:    ==================================================================== [facility] (MW) ==================================================================== Test                             Result       Flag       Units  Drug Present and Declared for Prescription Verification   Hydrocodone                    2596         EXPECTED   ng/mg creat   Hydromorphone                  610          EXPECTED   ng/mg creat   Dihydrocodeine                 621          EXPECTED   ng/mg creat   Norhydrocodone                 1640         EXPECTED   ng/mg creat    Sources of hydrocodone include scheduled prescription medications.    Hydromorphone, dihydrocodeine and norhydrocodone are expected    metabolites of hydrocodone. Hydromorphone and dihydrocodeine are    also available as scheduled prescription medications.  ==================================================================== Test                      Result    Flag   Units      Ref Range   Creatinine              298              mg/dL      >=20 ==================================================================== Declared Medications:  The flagging and interpretation on this report are based on the  following declared medications.  Unexpected results may arise from  inaccuracies in the declared medications.   **Note: The testing scope of this panel includes these medications:   Hydrocodone ([place])   **Note: The testing scope of this panel does not include the  following reported medications:   Acetaminophen ([place])  Amlodipine  Calcium  Cholecalciferol  Ciclopirox  Crisaborole  Difluprednate  Doxycycline  Efinaconazole ([place])  Fluocinolone  Furosemide  Hydrochlorothiazide  Ivermectin  Levothyroxine  Magnesium (Mag-Ox)  Meloxicam  Nystatin  Potassium Chloride  Prednisone  Spironolactone  Supplement  Topical   Triamcinolone ==================================================================== For clinical consultation, please call [phone]. ====================================================================      ROS  Constitutional: Denies any fever or chills Gastrointestinal: No reported hemesis, hematochezia, vomiting, or acute GI distress Musculoskeletal: Denies any acute onset joint swelling, redness, loss of ROM, or weakness Neurological: No reported episodes of acute onset apraxia, aphasia, dysarthria, agnosia, amnesia, paralysis, loss of coordination, or loss of consciousness  Medication Review  Benefiber, Cholecalciferol, Ciclopirox, Crisaborole, Difluprednate, Fluocinolone Acetonide, HYDROcodone-acetaminophen, Ivermectin, Magnesium Oxide, Potassium Chloride CR, Urea, amLODipine, calcium carbonate, doxycycline, furosemide, levothyroxine, meloxicam, nystatin-triamcinolone, and spironolactone  History Review  Allergy: Ms. Paddock is allergic to penicillins, shellfish allergy, and sulfa antibiotics. Drug: Ms. Spickard  reports no history of drug use. Alcohol:  reports no history of alcohol use. Tobacco:  reports that she has never smoked. She has never used smokeless tobacco. Social: Ms. Lai  reports that she has never smoked. She has never used smokeless tobacco.  She reports that she does not drink alcohol and does not use drugs. 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 Surgical: Ms. Veiga  has a past surgical history that includes Endometrial ablation; DILATATION AND CURETTAGE; Corneal transplant; and Corneal transplant. Family: family history includes Hypertension in her father and mother.  Laboratory Chemistry  Profile   Renal Lab Results  Component Value Date   BUN 14 01/17/2019   CREATININE 1.00 01/17/2019   BCR 14 01/17/2019   GFRAA 70 01/17/2019   GFRNONAA 61 01/17/2019     Hepatic Lab Results  Component Value Date   AST 32 01/17/2019   ALT 47 05/01/2016   ALBUMIN 4.8 01/17/2019   ALKPHOS 112 01/17/2019     Electrolytes Lab Results  Component Value Date   NA 141 01/17/2019   K 4.2 01/17/2019   CL 100 01/17/2019   CALCIUM 10.5 (H) 01/17/2019   MG 2.1 01/17/2019     Bone Lab Results  Component Value Date   25OHVITD1 36 01/17/2019   25OHVITD2 <1.0 01/17/2019   25OHVITD3 35 01/17/2019     Inflammation (CRP: Acute Phase) (ESR: Chronic Phase) Lab Results  Component Value Date   CRP 11 (H) 01/17/2019   ESRSEDRATE 39 01/17/2019       Note: Above Lab results reviewed.  Recent Imaging Review  DG Chest 2 View CLINICAL DATA:  Shortness of breath for several weeks  EXAM: CHEST - 2 VIEW  COMPARISON:  9/2/9  FINDINGS: The heart size and mediastinal contours are within normal limits. Both lungs are clear. The visualized skeletal structures are unremarkable.  IMPRESSION: No active cardiopulmonary disease.  Electronically Signed   By: Inez Catalina M.D.   On: 09/16/2019 08:40 Note: Reviewed        Physical Exam  General appearance: Well nourished, well developed, and well hydrated. In no apparent acute distress Mental status: Alert, oriented x 3 (person, place, & time)       Respiratory: No evidence of acute respiratory distress Eyes: PERLA Vitals: BP (!) 153/74 (BP Location: Right Arm, Patient Position: Sitting, Cuff Size: Large)   Pulse (!) 103   Temp (!) 97.1 F (36.2 C) (Temporal)   Resp 16   Ht _0  (1.6 m)   Wt 300 lb (136.1 kg)   SpO2 96%   BMI 53.14 kg/m  BMI: Estimated body mass index is 53.14 kg/m as calculated from the following:   Height as of this encounter: _1  (1.6 m).   Weight as of this encounter: 300 lb (136.1 kg). Ideal: Ideal  body weight: 52.4 kg (115 lb 8.3 oz) Adjusted ideal body weight: 85.9 kg (189 lb 5 oz)  Bilateral knee pain, arthropathic arthralgia, bilateral ankle pain  Assessment   Status Diagnosis  Controlled Controlled Controlled 1. Chronic pain syndrome   2. Pharmacologic therapy   3. Long term current use of opiate analgesic   4. Lumbar spondylosis        Plan of Care   Ms. NEDA WILLENBRING has a current medication list which includes the following long-term medication(s): amlodipine, calcium carbonate, cholecalciferol, furosemide, levothyroxine, magnesium oxide, meloxicam, potassium chloride cr, spironolactone, benefiber, [START ON 11/20/2020] hydrocodone-acetaminophen, [START ON 12/20/2020] hydrocodone-acetaminophen, and [START ON 01/19/2021] hydrocodone-acetaminophen.  Pharmacotherapy (Medications Ordered): Meds ordered this encounter  Medications   HYDROcodone-acetaminophen ([place])  10-325 MG tablet    Sig: Take 1 tablet by mouth every 6 (six) hours as needed for severe pain. Must last 30 days    Dispense:  120 tablet    Refill:  0    Chronic Pain: STOP Act (Not applicable) Fill 1 day early if closed on refill date.   HYDROcodone-acetaminophen ([place]CO) 10-325 MG tablet    Sig: Take 1 tablet by mouth every 6 (six) hours as needed for severe pain. Must last 30 days    Dispense:  120 tablet    Refill:  0    Chronic Pain: STOP Act (Not applicable) Fill 1 day early if closed on refill date.   HYDROcodone-acetaminophen (NORCO) 10-325 MG tablet    Sig: Take 1 tablet by mouth every 6 (six) hours as needed for severe pain. Must last 30 days    Dispense:  120 tablet    Refill:  0    Chronic Pain: STOP Act (Not applicable) Fill 1 day early if closed on refill date.    Follow-up plan:   Return in about 3 months (around 02/13/2021) for Medication Management, in person.   Recent Visits No visits were found meeting these conditions. Showing recent visits within past 90 days and meeting all other  requirements Today's Visits Date Type Provider Dept  11/13/20 Office Visit Gillis Santa, MD [facility]  Showing today's visits and meeting all other requirements Future Appointments No visits were found meeting these conditions. Showing future appointments within next 90 days and meeting all other requirements I discussed the assessment and treatment plan with the patient. The patient was provided an opportunity to ask questions and all were answered. The patient agreed with the plan and demonstrated an understanding of the instructions.  Patient advised to call back or seek an in-person evaluation if the symptoms or condition worsens.  Duration of encounter: 30 minutes.  Note by: Gillis Santa, MD Date: 11/13/2020; Time: 11:40 AM

## 2020-11-13 NOTE — Progress Notes (Signed)
Nursing Pain Medication Assessment:  Safety precautions to be maintained throughout the outpatient stay will include: orient to surroundings, keep bed in low position, maintain call bell within reach at all times, provide assistance with transfer out of bed and ambulation.  Medication Inspection Compliance: Pill count conducted under aseptic conditions, in front of the patient. Neither the pills nor the bottle was removed from the patient's sight at any time. Once count was completed pills were immediately returned to the patient in their original bottle.  Medication: Hydrocodone/APAP Pill/Patch Count:  33.5 of 120 pills remain Pill/Patch Appearance: Markings consistent with prescribed medication Bottle Appearance: Standard pharmacy container. Clearly labeled. Filled Date: 07 / 03 / 2022 Last Medication intake:  Today

## 2020-12-17 ENCOUNTER — Ambulatory Visit: Payer: 59 | Admitting: Dermatology

## 2020-12-17 ENCOUNTER — Other Ambulatory Visit: Payer: Self-pay

## 2020-12-17 DIAGNOSIS — L719 Rosacea, unspecified: Secondary | ICD-10-CM

## 2020-12-17 DIAGNOSIS — L84 Corns and callosities: Secondary | ICD-10-CM

## 2020-12-17 DIAGNOSIS — L219 Seborrheic dermatitis, unspecified: Secondary | ICD-10-CM | POA: Diagnosis not present

## 2020-12-17 MED ORDER — DOXYCYCLINE HYCLATE 100 MG PO TABS: 100.0000 mg | ORAL_TABLET | Freq: Two times a day (BID) | ORAL | 6 refills | Status: AC

## 2020-12-17 MED ORDER — FLUOCINOLONE ACETONIDE BODY 0.01 % EX OIL: TOPICAL_OIL | CUTANEOUS | 5 refills | Status: DC

## 2020-12-17 MED ORDER — IVERMECTIN 1 % EX CREA: 1.0000 "application " | TOPICAL_CREAM | Freq: Every day | CUTANEOUS | 11 refills | Status: DC

## 2020-12-17 MED ORDER — KETOCONAZOLE 2 % EX SHAM: MEDICATED_SHAMPOO | CUTANEOUS | 11 refills | Status: DC

## 2020-12-17 NOTE — Patient Instructions (Signed)

## 2020-12-17 NOTE — Progress Notes (Signed)
   Follow-Up Visit   Subjective  Alison Cox is a 65 y.o. female who presents for the following: Rosacea (Yearly Rosacea check on her face, treating with Doxycycline 100 gm daily ).  The following portions of the chart were reviewed this encounter and updated as appropriate:   Tobacco  Allergies  Meds  Problems  Med Hx  Surg Hx  Fam Hx     Review of Systems:  No other skin or systemic complaints except as noted in HPI or Assessment and Plan.  Objective  Well appearing patient in no apparent distress; mood and affect are within normal limits.  A focused examination was performed including face,scalp,elbows, feet. Relevant physical exam findings are noted in the Assessment and Plan.  Head - Anterior (Face) Mid face erythema with telangiectasias +/- scattered inflammatory papules.   Scalp, ears Pink patches with greasy scale.   feet, elbows Callus skin    Assessment & Plan  Rosacea Head - Anterior (Face)  Rosacea is a chronic progressive skin condition usually affecting the face of adults, causing redness and/or acne bumps. It is treatable but not curable. It sometimes affects the eyes (ocular rosacea) as well. It may respond to topical and/or systemic medication and can flare with stress, sun exposure, alcohol, exercise and some foods.  Daily application of broad spectrum spf 30+ sunscreen to face is recommended to reduce flares.   Cont Doxycycline 100 mg take 1 tablet daily  Cont Ivermectin cream apply to face daily   Related Medications doxycycline (VIBRA-TABS) 100 MG tablet Take 1 tablet (100 mg total) by mouth 2 (two) times daily.  Ivermectin 1 % CREA Apply 1 application topically at bedtime.  Seborrheic dermatitis Scalp, ears Seborrheic Dermatitis  -  is a chronic persistent rash characterized by pinkness and scaling most commonly of the mid face but also can occur on the scalp (dandruff), ears; mid chest and mid back. It tends to be exacerbated by stress and  cooler weather.  People who have neurologic disease may experience new onset or exacerbation of existing seborrheic dermatitis.  The condition is not curable but treatable and can be controlled.   Cont Ketoconazole shampoo as directed  Cont Fluocinolone oil as directed to ears  Related Medications Fluocinolone Acetonide Body 0.01 % OIL Apply to ears once or twice a day prn flares  ketoconazole (NIZORAL) 2 % shampoo apply three times per week, massage into scalp and leave in for 10 minutes before rinsing out  Callus feet, elbows Chronic and persistent  Start otc Urea 40% cream apply to callus skin once a day  Return in about 1 year (around 12/17/2021) for Rosacea, seborrheic dermatitis .  IMarye Round, CMA, am acting as scribe for Sarina Ser, MD .  Documentation: I have reviewed the above documentation for accuracy and completeness, and I agree with the above.  Sarina Ser, MD

## 2020-12-18 ENCOUNTER — Encounter: Payer: Self-pay | Admitting: Dermatology

## 2021-02-12 ENCOUNTER — Encounter: Payer: Self-pay | Admitting: Student in an Organized Health Care Education/Training Program

## 2021-02-12 ENCOUNTER — Ambulatory Visit
Payer: 59 | Attending: Student in an Organized Health Care Education/Training Program | Admitting: Student in an Organized Health Care Education/Training Program

## 2021-02-12 ENCOUNTER — Other Ambulatory Visit: Payer: Self-pay

## 2021-02-12 VITALS — BP 123/74 | HR 111 | Temp 97.9°F | Resp 20 | Ht 63.0 in | Wt 300.0 lb

## 2021-02-12 DIAGNOSIS — M159 Polyosteoarthritis, unspecified: Secondary | ICD-10-CM | POA: Diagnosis present

## 2021-02-12 DIAGNOSIS — T402X5A Adverse effect of other opioids, initial encounter: Secondary | ICD-10-CM | POA: Insufficient documentation

## 2021-02-12 DIAGNOSIS — Z79899 Other long term (current) drug therapy: Secondary | ICD-10-CM | POA: Diagnosis present

## 2021-02-12 DIAGNOSIS — K5903 Drug induced constipation: Secondary | ICD-10-CM | POA: Diagnosis present

## 2021-02-12 DIAGNOSIS — M47816 Spondylosis without myelopathy or radiculopathy, lumbar region: Secondary | ICD-10-CM | POA: Diagnosis present

## 2021-02-12 DIAGNOSIS — G894 Chronic pain syndrome: Secondary | ICD-10-CM | POA: Diagnosis present

## 2021-02-12 DIAGNOSIS — Z79891 Long term (current) use of opiate analgesic: Secondary | ICD-10-CM | POA: Diagnosis present

## 2021-02-12 MED ORDER — HYDROCODONE-ACETAMINOPHEN 10-325 MG PO TABS: 1.0000 | ORAL_TABLET | Freq: Four times a day (QID) | ORAL | 0 refills | Status: DC | PRN

## 2021-02-12 MED ORDER — MELOXICAM 15 MG PO TABS: 15.0000 mg | ORAL_TABLET | Freq: Every day | ORAL | 1 refills | Status: AC

## 2021-02-12 NOTE — Progress Notes (Signed)
PROVIDER NOTE: Information contained herein reflects review and annotations entered in association with encounter. Interpretation of such information and data should be left to medically-trained personnel. Information provided to patient can be located elsewhere in the medical record under "Patient Instructions". Document created using STT-dictation technology, any transcriptional errors that may result from process are unintentional.    Patient: Alison Cox  Service Category: E/M  Provider: Gillis Santa, MD  DOB: 18-May-1955  DOS: 02/12/2021  Specialty: Interventional Pain Management  MRN: [id number]  Setting: Ambulatory outpatient  PCP: Lajean Manes, MD  Type: Established Patient    Referring Provider: Lajean Manes, MD  Location: Office  Delivery: Face-to-face     HPI  [name], a 65 y.o. year old female, is here today because of her Chronic pain syndrome [G89.4]. Ms. Trosper primary complain today is Knee Pain (biilateral) Last encounter: My last encounter with her was on 11/13/20 Pertinent problems: Ms. Knoebel has Long term current use of opiate analgesic; Long term prescription opiate use; Opiate use (40 MME/Day); Opioid dependence ([place]); Lumbar spondylosis; Grade 1 Anterolisthesis of L4 over L5; Chronic knee pain (Primary Area of Pain) (Bilateral) (R>L); Osteoarthritis of knees (Location of Primary Source of Pain) (Bilateral) (R>L); History of Hashimoto thyroiditis; Osteoarthrosis; Spondylolysis of lumbosacral region (Advanced disc degeneration and spondylosis L5-S1); Osteoarthritis of ankles (Location of Secondary source of pain) (Bilateral) (L>R); Chronic low back pain (Third area of Pain) (midline); Chronic pain syndrome; Musculoskeletal pain; Chronic ankle pain (Secondary area of Pain) (Bilateral) (L>R); and Osteoarthritis involving multiple joints on their pertinent problem list. Pain Assessment: Severity of   is reported as a 4 /10. Location: Knee Right, Left/ . Onset: More than a  month ago. Quality: Hervey Ard, Aching. Timing: Intermittent. Modifying factor(s): sitting, medications. Vitals:  height is '5\' 3"'  (1.6 m) and weight is 300 lb (136.1 kg). Her temporal temperature is 97.9 F (36.6 C). Her blood pressure is 123/74 and her pulse is 111 (abnormal). Her respiration is 20 and oxygen saturation is 97%.   Reason for encounter: medication management.   No change in medical history since last visit.  Patient's pain is at baseline.  Patient continues multimodal pain regimen as prescribed.  States that it provides pain relief and improvement in functional status.  Pharmacotherapy Assessment   Analgesic: Hydrocodone 10 mg 4 times daily as needed, quantity 120/month MME equals 40    Monitoring: [place] PMP: PDMP reviewed during this encounter.       Pharmacotherapy: No side-effects or adverse reactions reported. Compliance: No problems identified. Effectiveness: Clinically acceptable.  [name], RN  02/12/2021 11:10 AM  Sign when Signing Visit Nursing Pain Medication Assessment:  Safety precautions to be maintained throughout the outpatient stay will include: orient to surroundings, keep bed in low position, maintain call bell within reach at all times, provide assistance with transfer out of bed and ambulation.  Medication Inspection Compliance: Pill count conducted under aseptic conditions, in front of the patient. Neither the pills nor the bottle was removed from the patient's sight at any time. Once count was completed pills were immediately returned to the patient in their original bottle.  Medication: Hydrocodone/APAP Pill/Patch Count:  31 of 120 pills remain Pill/Patch Appearance: Markings consistent with prescribed medication Bottle Appearance: Standard pharmacy container. Clearly labeled. Filled Date: 101 / 01 / 2022 Last Medication intake:  Today      UDS:  Summary  Date Value Ref Range Status  01/10/2020 Note  Final    Comment:     ====================================================================  [employer] Select 13 (MW) ==================================================================== Test                             Result       Flag       Units  Drug Present and Declared for Prescription Verification   Hydrocodone                    2596         EXPECTED   ng/mg creat   Hydromorphone                  610          EXPECTED   ng/mg creat   Dihydrocodeine                 621          EXPECTED   ng/mg creat   Norhydrocodone                 1640         EXPECTED   ng/mg creat    Sources of hydrocodone include scheduled prescription medications.    Hydromorphone, dihydrocodeine and norhydrocodone are expected    metabolites of hydrocodone. Hydromorphone and dihydrocodeine are    also available as scheduled prescription medications.  ==================================================================== Test                      Result    Flag   Units      Ref Range   Creatinine              298              mg/dL      >=20 ==================================================================== Declared Medications:  The flagging and interpretation on this report are based on the  following declared medications.  Unexpected results may arise from  inaccuracies in the declared medications.   **Note: The testing scope of this panel includes these medications:   Hydrocodone ([place]co)   **Note: The testing scope of this panel does not include the  following reported medications:   Acetaminophen (Norco)  Amlodipine  Calcium  Cholecalciferol  Ciclopirox  Crisaborole  Difluprednate  Doxycycline  Efinaconazole ([place])  Fluocinolone  Furosemide  Hydrochlorothiazide  Ivermectin  Levothyroxine  Magnesium (Mag-Ox)  Meloxicam  Nystatin  Potassium Chloride  Prednisone  Spironolactone  Supplement  Topical  Triamcinolone ==================================================================== For clinical  consultation, please call [phone]. ====================================================================      ROS  Constitutional: Denies any fever or chills Gastrointestinal: No reported hemesis, hematochezia, vomiting, or acute GI distress Musculoskeletal: Denies any acute onset joint swelling, redness, loss of ROM, or weakness Neurological: No reported episodes of acute onset apraxia, aphasia, dysarthria, agnosia, amnesia, paralysis, loss of coordination, or loss of consciousness  Medication Review  Benefiber, Cholecalciferol, Ciclopirox, Crisaborole, Difluprednate, Fluocinolone Acetonide, Fluocinolone Acetonide Body, HYDROcodone-acetaminophen, Ivermectin, Magnesium Oxide, Potassium Chloride CR, amLODipine, calcium carbonate, doxycycline, furosemide, ketoconazole, levothyroxine, meloxicam, nystatin-triamcinolone, and spironolactone  History Review  Allergy: Ms. Gavitt is allergic to penicillins, shellfish allergy, and sulfa antibiotics. Drug: Ms. Monforte  reports no history of drug use. Alcohol:  reports no history of alcohol use. Tobacco:  reports that she has never smoked. She has never used smokeless tobacco. Social: Ms. Bicking  reports that she has never smoked. She has never used smokeless tobacco. She reports that she does not drink alcohol and does not use drugs. 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 Surgical: Ms. Kafer  has a past surgical history that includes Endometrial ablation; DILATATION AND CURETTAGE; Corneal transplant; and Corneal transplant. Family: family history includes Hypertension in her father and mother.  Laboratory Chemistry Profile   Renal Lab Results  Component Value Date   BUN 14  01/17/2019   CREATININE 1.00 01/17/2019   BCR 14 01/17/2019   GFRAA 70 01/17/2019   GFRNONAA 61 01/17/2019     Hepatic Lab Results  Component Value Date   AST 32 01/17/2019   ALT 47 05/01/2016   ALBUMIN 4.8 01/17/2019   ALKPHOS 112 01/17/2019     Electrolytes Lab Results  Component Value Date   NA 141 01/17/2019   K 4.2 01/17/2019   CL 100 01/17/2019   CALCIUM 10.5 (H) 01/17/2019   MG 2.1 01/17/2019     Bone Lab Results  Component Value Date   25OHVITD1 36 01/17/2019   25OHVITD2 <1.0 01/17/2019   25OHVITD3 35 01/17/2019     Inflammation (CRP: Acute Phase) (ESR: Chronic Phase) Lab Results  Component Value Date   CRP 11 (H) 01/17/2019   ESRSEDRATE 39 01/17/2019       Note: Above Lab results reviewed.  Recent Imaging Review  DG Chest 2 View CLINICAL DATA:  Shortness of breath for several weeks  EXAM: CHEST - 2 VIEW  COMPARISON:  9/2/9  FINDINGS: The heart size and mediastinal contours are within normal limits. Both lungs are clear. The visualized skeletal structures are unremarkable.  IMPRESSION: No active cardiopulmonary disease.  Electronically Signed   By: Inez Catalina M.D.   On: 09/16/2019 08:40 Note: Reviewed        Physical Exam  General appearance: Well nourished, well developed, and well hydrated. In no apparent acute distress Mental status: Alert, oriented x 3 (person, place, & time)       Respiratory: No evidence of acute respiratory distress Eyes: PERLA Vitals: BP 123/74   Pulse (!) 111   Temp 97.9 F (36.6 C) (Temporal)   Resp 20   Ht '5\' 3"'  (1.6 m)   Wt 300 lb (136.1 kg)   SpO2 97%   BMI 53.14 kg/m  BMI: Estimated body mass index is 53.14 kg/m as calculated from the following:   Height as of this encounter: '5\' 3"'  (1.6 m).   Weight as of this encounter: 300 lb (136.1 kg). Ideal: Ideal body weight: 52.4 kg (115 lb 8.3 oz) Adjusted ideal body weight: 85.9 kg (189 lb 5 oz)  Bilateral knee pain, arthropathic arthralgia,  bilateral ankle pain  Assessment   Status Diagnosis  Controlled Controlled Controlled 1. Chronic pain syndrome   2. Pharmacologic therapy   3. Long term current use of opiate analgesic   4. Lumbar spondylosis   5. Osteoarthritis involving multiple joints   6. Therapeutic opioid-induced constipation (OIC)         Plan of Care   Ms. AMRAN MALTER has a current medication list which includes the following long-term medication(s): amlodipine, calcium carbonate, furosemide, levothyroxine, potassium chloride cr, spironolactone, cholecalciferol, [START ON 02/18/2021] hydrocodone-acetaminophen, [START ON 03/20/2021] hydrocodone-acetaminophen, [START ON 04/19/2021] hydrocodone-acetaminophen, magnesium oxide, meloxicam, and benefiber.  Pharmacotherapy (Medications Ordered): Meds ordered this encounter  Medications   HYDROcodone-acetaminophen (NORCO) 10-325 MG tablet    Sig: Take 1 tablet by mouth  every 6 (six) hours as needed for severe pain. Must last 30 days    Dispense:  120 tablet    Refill:  0    Chronic Pain: STOP Act (Not applicable) Fill 1 day early if closed on refill date.   HYDROcodone-acetaminophen (NORCO) 10-325 MG tablet    Sig: Take 1 tablet by mouth every 6 (six) hours as needed for severe pain. Must last 30 days    Dispense:  120 tablet    Refill:  0    Chronic Pain: STOP Act (Not applicable) Fill 1 day early if closed on refill date.   HYDROcodone-acetaminophen (NORCO) 10-325 MG tablet    Sig: Take 1 tablet by mouth every 6 (six) hours as needed for severe pain. Must last 30 days    Dispense:  120 tablet    Refill:  0    Chronic Pain: STOP Act (Not applicable) Fill 1 day early if closed on refill date.   meloxicam (MOBIC) 15 MG tablet    Sig: Take 1 tablet (15 mg total) by mouth daily.    Dispense:  90 tablet    Refill:  1    Fill one day early if pharmacy is closed on scheduled refill date. May substitute for generic if available.     Follow-up plan:   Return  in about 3 months (around 05/15/2021) for Medication Management, in person.   Recent Visits No visits were found meeting these conditions. Showing recent visits within past 90 days and meeting all other requirements Today's Visits Date Type Provider Dept  02/12/21 Office Visit Gillis Santa, MD [facility]  Showing today's visits and meeting all other requirements Future Appointments No visits were found meeting these conditions. Showing future appointments within next 90 days and meeting all other requirements I discussed the assessment and treatment plan with the patient. The patient was provided an opportunity to ask questions and all were answered. The patient agreed with the plan and demonstrated an understanding of the instructions.  Patient advised to call back or seek an in-person evaluation if the symptoms or condition worsens.  Duration of encounter: 30 minutes.  Note by: Gillis Santa, MD Date: 02/12/2021; Time: 11:26 AM

## 2021-02-12 NOTE — Progress Notes (Signed)
Nursing Pain Medication Assessment:  Safety precautions to be maintained throughout the outpatient stay will include: orient to surroundings, keep bed in low position, maintain call bell within reach at all times, provide assistance with transfer out of bed and ambulation.  Medication Inspection Compliance: Pill count conducted under aseptic conditions, in front of the patient. Neither the pills nor the bottle was removed from the patient's sight at any time. Once count was completed pills were immediately returned to the patient in their original bottle.  Medication: Hydrocodone/APAP Pill/Patch Count:  31 of 120 pills remain Pill/Patch Appearance: Markings consistent with prescribed medication Bottle Appearance: Standard pharmacy container. Clearly labeled. Filled Date: 39 / 01 / 2022 Last Medication intake:  Today

## 2021-02-18 LAB — TOXASSURE SELECT 13 (MW), URINE

## 2021-04-30 ENCOUNTER — Ambulatory Visit: Payer: 59 | Attending: Geriatric Medicine

## 2021-04-30 ENCOUNTER — Other Ambulatory Visit: Payer: Self-pay

## 2021-04-30 DIAGNOSIS — R42 Dizziness and giddiness: Secondary | ICD-10-CM | POA: Insufficient documentation

## 2021-04-30 DIAGNOSIS — R2681 Unsteadiness on feet: Secondary | ICD-10-CM | POA: Diagnosis present

## 2021-04-30 DIAGNOSIS — H8111 Benign paroxysmal vertigo, right ear: Secondary | ICD-10-CM | POA: Diagnosis not present

## 2021-04-30 NOTE — Therapy (Signed)
[facility] [address] [place], [place], [address] Phone: [phone]   Fax:  [phone]  Physical Therapy Evaluation  Patient Details  Name: Alison Cox MRN: [id number] Date of Birth: 1955/05/04 Referring Provider (PT): Lajean Manes, MD   Encounter Date: 04/30/2021   PT End of Session - 04/30/21 1441     Visit Number 1    Number of Visits 5    Date for PT Re-Evaluation 06/06/21   4 weeks + Eval   Authorization Type UHC (VL: 60)    PT Start Time 6812    PT Stop Time 7517    PT Time Calculation (min) 33 min    Activity Tolerance Patient tolerated treatment well    Behavior During Therapy [facility] for tasks assessed/performed             Past Medical History:  Diagnosis Date   History of Bell's palsy 03/06/2015   History of Hashimoto thyroiditis 03/06/2015   History of nephrolithiasis 03/06/2015   Hypertension    Kidney stone    Macular corneal dystrophy 04/22/2003   Osteoarthritis    Squamous cell carcinoma of skin 10/30/2016   L near hairline sideburn - atypical squamous proliferation with cystic changes and LSC   Thyroid disease     Past Surgical History:  Procedure Laterality Date   CORNEAL TRANSPLANT     CORNEAL TRANSPLANT     DILATATION AND CURETTAGE     ENDOMETRIAL ABLATION      There were no vitals filed for this visit.    Subjective Assessment - 04/30/21 1446     Subjective Patient reports that she first experienced vertigo when she was lying down in the bed at night when she rolled onto her R side, felt as her eyes were rolling. Patient reports she tried the [employer]. Reports she is still having episodes in the bed. Reports it has gotten better, but still has them. Mild nausea. Has some hearing loss in the L side, but reports unrelated. No falls to report.    Pertinent History Bell's Palsy, Hashimoto Thyroiditis, Nephrolithiasis, HTN, Macular Corneal Dystrophy, OA. Upon evaluation,  patient presents with the following upon evaluation:    Limitations Standing;Walking    Patient Stated Goals Resolve the Vertigo    Currently in Pain? No/denies                [facility] PT Assessment - 04/30/21 0001       Assessment   Medical Diagnosis Vertigo    Referring Provider (PT) Lajean Manes, MD    Onset Date/Surgical Date 04/11/21    Prior Therapy None for Vertigo      Precautions   Precautions Other (comment)    Precaution Comments Bell's Palsy, Hashimoto Thyroiditis, Nephrolithiasis, HTN, Macular Corneal Dystrophy, OA      Restrictions   Weight Bearing Restrictions No      Balance Screen   Has the patient fallen in the past 6 months No    Has the patient had a decrease in activity level because of a fear of falling?  No    Is the patient reluctant to leave their home because of a fear of falling?  No      Home Environment   Living Environment Private residence    Living Arrangements Spouse/significant other;Children    Available Help at Discharge Family    Type of [place]    Additional Comments denies difficulty getting in/around house  Prior Function   Level of Independence Independent    Vocation Other (comment)   Not Currently Working     Cognition   Overall Cognitive Status Within Functional Limits for tasks assessed      Observation/Other Assessments   Focus on Therapeutic Outcomes (FOTO)  DPS: 39%, DFS: 44%      Sensation   Light Touch Appears Intact      ROM / Strength   AROM / PROM / Strength Strength      Strength   Overall Strength Within functional limits for tasks performed      Transfers   Transfers Sit to Stand;Stand to Sit    Sit to Stand 6: Modified independent (Device/Increase time)    Stand to Sit 6: Modified independent (Device/Increase time)      Ambulation/Gait   Ambulation/Gait Yes    Ambulation/Gait Assistance 6: Modified independent (Device/Increase time)    Ambulation Distance (Feet) 50 Feet    Assistive  device None               Vestibular Assessment - 04/30/21 0001       Symptom Behavior   Subjective history of current problem See Subjective    Type of Dizziness  Spinning;Vertigo    Frequency of Dizziness with specific movements, duration of movement    Duration of Dizziness seconds - minutes    Symptom Nature Positional;Intermittent    Aggravating Factors Rolling to right;Rolling to left;Lying supine    Relieving Factors Slow movements;Rest    Progression of Symptoms Better      Oculomotor Exam   Oculomotor Alignment Abnormal    Comment History of Macular Corneal Dystrophy      Positional Testing   Dix-Hallpike Dix-Hallpike Right;Dix-Hallpike Left    Horizontal Canal Testing Horizontal Canal Right;Horizontal Canal Left      Dix-Hallpike Right   Dix-Hallpike Right Duration 10 seconds    Dix-Hallpike Right Symptoms Upbeat, right rotatory nystagmus      Dix-Hallpike Left   Dix-Hallpike Left Duration 0    Dix-Hallpike Left Symptoms No nystagmus      Horizontal Canal Right   Horizontal Canal Right Duration 0    Horizontal Canal Right Symptoms Normal      Horizontal Canal Left   Horizontal Canal Left Duration 0    Horizontal Canal Left Symptoms Normal                Objective measurements completed on examination: See above findings.        Vestibular Treatment/Exercise - 04/30/21 0001       Vestibular Treatment/Exercise   Vestibular Treatment Provided Canalith Repositioning    Canalith Repositioning Epley Manuever Right       EPLEY MANUEVER RIGHT   Number of Reps  2    Overall Response Improved Symptoms    Response Details  very mild symptoms on reassesment.                    PT Education - 04/30/21 1512     Education Details Educated on [employer]; BPPV    Person(s) Educated Patient    Methods Explanation    Comprehension Verbalized understanding              PT Short Term Goals - 04/30/21 1517       PT  SHORT TERM GOAL #1   Title = LTGs               PT Long Term Goals -  04/30/21 1518       PT LONG TERM GOAL #1   Title Patietn will verbalize understanding of home CRM for self management of symptoms in case of reoccurence    Baseline dependent    Time 4    Period Weeks    Status New    Target Date 06/06/21      PT LONG TERM GOAL #2   Title Pt will improve DPS to >/= 50%    Baseline 39%    Time 4    Period Weeks    Status New      PT LONG TERM GOAL #3   Title Pt will demo (-) positional testing to indicate resolution of BPPV    Baseline R BPPV    Time 4    Period Weeks    Status New                    Plan - 04/30/21 1449     Clinical Impression Statement Patient is a 66 y.o. female referred to Neuro OPPT services for Vertigo. Patient's PMH significant for the following: Bell's Palsy, Hashimoto Thyroiditis, Nephrolithiasis, HTN, Macular Corneal Dystrophy, OA. Upon evaluation, patient presents with dizziness/vertigo and  short duration R Upbeating Nystagmus indicating R Posterior Canal Canalithiasis. Completed CRM x 2 reps with improvements noted, no full resolution. Pt will benefit from skilled PT services to address impairments and improve activity tolerance.    Personal Factors and Comorbidities Comorbidity 3+;Time since onset of injury/illness/exacerbation    Comorbidities Bell's Palsy, Hashimoto Thyroiditis, Nephrolithiasis, HTN, Macular Corneal Dystrophy, OA    Examination-Activity Limitations Bed Mobility;Bend    Examination-Participation Restrictions Cleaning    Stability/Clinical Decision Making Stable/Uncomplicated    Clinical Decision Making Low    Rehab Potential Good    PT Frequency 1x / week    PT Duration 4 weeks   plus eval   PT Treatment/Interventions ADLs/Self Care Home Management;Canalith Repostioning;Gait training;Stair training;Functional mobility training;Therapeutic activities;Therapeutic exercise;Balance training;Neuromuscular  re-education;Patient/family education;Dry needling;Vestibular;Manual techniques;Passive range of motion;Moist Heat;Cryotherapy    PT Next Visit Plan Reassess R BPPV and Treat as Indicated. Teach Home CRM and provide handout.    Consulted and Agree with Plan of Care Patient             Patient will benefit from skilled therapeutic intervention in order to improve the following deficits and impairments:  Dizziness, Decreased balance, Decreased activity tolerance  Visit Diagnosis: BPPV (benign paroxysmal positional vertigo), right  Dizziness and giddiness  Unsteadiness on feet     Problem List Patient Active Problem List   Diagnosis Date Noted   Osteoarthritis involving multiple joints 10/25/2018   Pharmacologic therapy 10/25/2018   Disorder of skeletal system 10/25/2018   Problems influencing health status 10/25/2018   Knee pain 10/06/2017   Chronic autoimmune thyroiditis 09/23/2016   Hypothyroidism due to Hashimoto's thyroiditis 09/23/2016   Nontoxic thyroid nodule 09/23/2016   Musculoskeletal pain 07/29/2016   Chronic ankle pain (Secondary area of Pain) (Bilateral) (L>R) 07/29/2016   Chronic pain syndrome 05/01/2016   Osteoarthritis of ankles (Location of Secondary source of pain) (Bilateral) (L>R) 06/07/2015   Chronic low back pain (Third area of Pain) (midline) 06/07/2015   Chronic hip pain (Left) 06/07/2015   Long term current use of opiate analgesic 03/06/2015   Long term prescription opiate use 03/06/2015   Opiate use (40 MME/Day) 03/06/2015   Encounter for therapeutic drug level monitoring 03/06/2015   Opioid dependence ([place]) 03/06/2015   Lumbar spondylosis 03/06/2015  Grade 1 Anterolisthesis of L4 over L5 03/06/2015   Therapeutic opioid-induced constipation (OIC) 03/06/2015   Chronic knee pain (Primary Area of Pain) (Bilateral) (R>L) 03/06/2015   Osteoarthritis of knees (Location of Primary Source of Pain) (Bilateral) (R>L) 03/06/2015   Vitamin D insufficiency  03/06/2015   History of Bell's palsy (left-sided) 03/06/2015   History of nephrolithiasis 03/06/2015   History of Hashimoto thyroiditis 03/06/2015   Osteoarthrosis 03/06/2015   Obesity, Class III, BMI 40-49.9 (morbid obesity) ([place]) (254% higher incidence of chronic low back pain) 03/06/2015   Spondylolysis of lumbosacral region (Advanced disc degeneration and spondylosis L5-S1) 03/06/2015   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 [place], [place], [address] Phone: [phone]   Fax:  [phone]  Name: LYLIANNA FRAISER MRN: [id number] Date of Birth: 1956-02-26

## 2021-05-14 ENCOUNTER — Encounter: Payer: Self-pay | Admitting: Student in an Organized Health Care Education/Training Program

## 2021-05-14 ENCOUNTER — Other Ambulatory Visit: Payer: Self-pay

## 2021-05-14 ENCOUNTER — Ambulatory Visit
Payer: 59 | Attending: Student in an Organized Health Care Education/Training Program | Admitting: Student in an Organized Health Care Education/Training Program

## 2021-05-14 VITALS — BP 158/82 | HR 107 | Temp 98.0°F | Resp 16 | Ht 63.0 in | Wt 300.0 lb

## 2021-05-14 DIAGNOSIS — M25561 Pain in right knee: Secondary | ICD-10-CM

## 2021-05-14 DIAGNOSIS — K5903 Drug induced constipation: Secondary | ICD-10-CM

## 2021-05-14 DIAGNOSIS — Z79891 Long term (current) use of opiate analgesic: Secondary | ICD-10-CM

## 2021-05-14 DIAGNOSIS — G8929 Other chronic pain: Secondary | ICD-10-CM | POA: Diagnosis present

## 2021-05-14 DIAGNOSIS — M159 Polyosteoarthritis, unspecified: Secondary | ICD-10-CM

## 2021-05-14 DIAGNOSIS — Z79899 Other long term (current) drug therapy: Secondary | ICD-10-CM

## 2021-05-14 DIAGNOSIS — M25562 Pain in left knee: Secondary | ICD-10-CM

## 2021-05-14 DIAGNOSIS — G894 Chronic pain syndrome: Secondary | ICD-10-CM

## 2021-05-14 DIAGNOSIS — M47816 Spondylosis without myelopathy or radiculopathy, lumbar region: Secondary | ICD-10-CM | POA: Diagnosis not present

## 2021-05-14 DIAGNOSIS — M150 Primary generalized (osteo)arthritis: Secondary | ICD-10-CM

## 2021-05-14 DIAGNOSIS — T402X5A Adverse effect of other opioids, initial encounter: Secondary | ICD-10-CM

## 2021-05-14 DIAGNOSIS — M7918 Myalgia, other site: Secondary | ICD-10-CM | POA: Diagnosis present

## 2021-05-14 MED ORDER — HYDROCODONE-ACETAMINOPHEN 10-325 MG PO TABS: 1.0000 | ORAL_TABLET | Freq: Four times a day (QID) | ORAL | 0 refills | Status: DC | PRN

## 2021-05-14 NOTE — Progress Notes (Signed)
Nursing Pain Medication Assessment:  Safety precautions to be maintained throughout the outpatient stay will include: orient to surroundings, keep bed in low position, maintain call bell within reach at all times, provide assistance with transfer out of bed and ambulation.  Medication Inspection Compliance: Pill count conducted under aseptic conditions, in front of the patient. Neither the pills nor the bottle was removed from the patient's sight at any time. Once count was completed pills were immediately returned to the patient in their original bottle.  Medication: Hydrocodone/APAP Pill/Patch Count:  28 of 120 pills remain Pill/Patch Appearance: Markings consistent with prescribed medication Bottle Appearance: Standard pharmacy container. Clearly labeled. Filled Date: 69 / 30 / 2022 Last Medication intake:  Today

## 2021-05-14 NOTE — Progress Notes (Signed)
PROVIDER NOTE: Information contained herein reflects review and annotations entered in association with encounter. Interpretation of such information and data should be left to medically-trained personnel. Information provided to patient can be located elsewhere in the medical record under "Patient Instructions". Document created using STT-dictation technology, any transcriptional errors that may result from process are unintentional.    Patient: Alison Cox  Service Category: E/M  Provider: Gillis Santa, MD  DOB: [birth year]  DOS: 05/14/2021  Specialty: Interventional Pain Management  MRN: [id number]  Setting: Ambulatory outpatient  PCP: Alison Manes, MD  Type: Established Patient    Referring Provider: Lajean Manes, MD  Location: Office  Delivery: Face-to-face     HPI  [name], a 66 y.o. year old female, is here today because of her Chronic pain syndrome [G89.4]. [name] primary complain today is Knee Pain (Bilateral ) and Ankle Pain (Bilateral )  Last encounter: My last encounter with her was on 02/12/21  Pertinent problems: [name] has Long term current use of opiate analgesic; Long term prescription opiate use; Opiate use (40 MME/Day); Opioid dependence ([place]); Lumbar spondylosis; Grade 1 Anterolisthesis of L4 over L5; Chronic knee pain (Primary Area of Pain) (Bilateral) (R>L); Osteoarthritis of knees (Location of Primary Source of Pain) (Bilateral) (R>L); History of Hashimoto thyroiditis; Osteoarthrosis; Spondylolysis of lumbosacral region (Advanced disc degeneration and spondylosis L5-S1); Osteoarthritis of ankles (Location of Secondary source of pain) (Bilateral) (L>R); Chronic low back pain (Third area of Pain) (midline); Chronic pain syndrome; Musculoskeletal pain; Chronic ankle pain (Secondary area of Pain) (Bilateral) (L>R); and Osteoarthritis involving multiple joints on their pertinent problem list. Pain Assessment: Severity of Chronic pain is reported as a 2 /10. Location:  Knee (ankles) Left, Right/ . Onset: More than a month ago. Quality: Discomfort, Constant, Aching, Penetrating. Timing: Constant. Modifying factor(s): sitting and medications. Vitals:  height is '5\' 3"'  (1.6 m) and weight is 300 lb (136.1 kg). Her temporal temperature is 98 F (36.7 C). Her blood pressure is 158/82 (abnormal) and her pulse is 107 (abnormal). Her respiration is 16 and oxygen saturation is 97%.   Reason for encounter: medication management.   No change in medical history since last visit.  Patient's pain is at baseline.  Patient continues multimodal pain regimen as prescribed.  States that it provides pain relief and improvement in functional status.  Pharmacotherapy Assessment   Analgesic: Hydrocodone 10 mg 4 times daily as needed, quantity 120/month MME equals 40    Monitoring: [place] PMP: PDMP reviewed during this encounter.       Pharmacotherapy: No side-effects or adverse reactions reported. Compliance: No problems identified. Effectiveness: Clinically acceptable.  Janett Billow, RN  05/14/2021 11:53 AM  Sign when Signing Visit Nursing Pain Medication Assessment:  Safety precautions to be maintained throughout the outpatient stay will include: orient to surroundings, keep bed in low position, maintain call bell within reach at all times, provide assistance with transfer out of bed and ambulation.  Medication Inspection Compliance: Pill count conducted under aseptic conditions, in front of the patient. Neither the pills nor the bottle was removed from the patient's sight at any time. Once count was completed pills were immediately returned to the patient in their original bottle.  Medication: Hydrocodone/APAP Pill/Patch Count:  28 of 120 pills remain Pill/Patch Appearance: Markings consistent with prescribed medication Bottle Appearance: Standard pharmacy container. Clearly labeled. Filled Date: 95 / 30 / 2022 Last Medication intake:  Today     UDS:  Summary  Date  Value  Ref Range Status  02/12/2021 Note  Final    Comment:    ==================================================================== [facility] (MW) ==================================================================== Test                             Result       Flag       Units  Drug Present and Declared for Prescription Verification   Hydrocodone                    3980         EXPECTED   ng/mg creat   Hydromorphone                  554          EXPECTED   ng/mg creat   Dihydrocodeine                 681          EXPECTED   ng/mg creat   Norhydrocodone                 2197         EXPECTED   ng/mg creat    Sources of hydrocodone include scheduled prescription medications.    Hydromorphone, dihydrocodeine and norhydrocodone are expected    metabolites of hydrocodone. Hydromorphone and dihydrocodeine are    also available as scheduled prescription medications.  ==================================================================== Test                      Result    Flag   Units      Ref Range   Creatinine              226              mg/dL      >=20 ==================================================================== Declared Medications:  The flagging and interpretation on this report are based on the  following declared medications.  Unexpected results may arise from  inaccuracies in the declared medications.   **Note: The testing scope of this panel includes these medications:   Hydrocodone (Norco)   **Note: The testing scope of this panel does not include the  following reported medications:   Acetaminophen (Norco)  Amlodipine (Norvasc)  Calcium  Cholecalciferol  Ciclopirox  Crisaborole  Doxycycline  Eye Drop  Fluocinolone  Furosemide (Lasix)  Ivermectin  Ketoconazole  Levothyroxine (Synthroid)  Magnesium (Mag-Ox)  Meloxicam (Mobic)  Nystatin  Spironolactone (Aldactone)  Supplement (Fiber)   Triamcinolone ==================================================================== For clinical consultation, please call [phone]. ====================================================================      ROS  Constitutional: Denies any fever or chills Gastrointestinal: No reported hemesis, hematochezia, vomiting, or acute GI distress Musculoskeletal:  +LBP Neurological: No reported episodes of acute onset apraxia, aphasia, dysarthria, agnosia, amnesia, paralysis, loss of coordination, or loss of consciousness  Medication Review  Benefiber, Cholecalciferol, Ciclopirox, Crisaborole, Difluprednate, Fluocinolone Acetonide, Fluocinolone Acetonide Body, HYDROcodone-acetaminophen, Ivermectin, Magnesium Oxide, Potassium Chloride CR, albuterol, amLODipine, calcium carbonate, doxycycline, furosemide, ketoconazole, levothyroxine, losartan, meloxicam, nystatin-triamcinolone, prednisoLONE acetate, and spironolactone  History Review  Allergy: Ms. Cherian is allergic to penicillins, shellfish allergy, and sulfa antibiotics. Drug: Ms. Purington  reports no history of drug use. Alcohol:  reports no history of alcohol use. Tobacco:  reports that she has never smoked. She has never used smokeless tobacco. Social: Ms. Borden  reports that she has never smoked. She has never used smokeless tobacco. She reports that she does not drink  alcohol and does not use drugs. 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 Surgical: Ms. Manago  has a past surgical history that includes Endometrial ablation; DILATATION AND CURETTAGE; Corneal transplant; and Corneal transplant. Family: family history includes Hypertension in her father and mother.  Laboratory  Chemistry Profile   Renal Lab Results  Component Value Date   BUN 14 01/17/2019   CREATININE 1.00 01/17/2019   BCR 14 01/17/2019   GFRAA 70 01/17/2019   GFRNONAA 61 01/17/2019     Hepatic Lab Results  Component Value Date   AST 32 01/17/2019   ALT 47 05/01/2016   ALBUMIN 4.8 01/17/2019   ALKPHOS 112 01/17/2019     Electrolytes Lab Results  Component Value Date   NA 141 01/17/2019   K 4.2 01/17/2019   CL 100 01/17/2019   CALCIUM 10.5 (H) 01/17/2019   MG 2.1 01/17/2019     Bone Lab Results  Component Value Date   25OHVITD1 36 01/17/2019   25OHVITD2 <1.0 01/17/2019   25OHVITD3 35 01/17/2019     Inflammation (CRP: Acute Phase) (ESR: Chronic Phase) Lab Results  Component Value Date   CRP 11 (H) 01/17/2019   ESRSEDRATE 39 01/17/2019       Note: Above Lab results reviewed.  Recent Imaging Review  DG Chest 2 View CLINICAL DATA:  Shortness of breath for several weeks  EXAM: CHEST - 2 VIEW  COMPARISON:  9/2/9  FINDINGS: The heart size and mediastinal contours are within normal limits. Both lungs are clear. The visualized skeletal structures are unremarkable.  IMPRESSION: No active cardiopulmonary disease.  Electronically Signed   By: Inez Catalina M.D.   On: 09/16/2019 08:40  Note: Reviewed        Physical Exam  General appearance: Well nourished, well developed, and well hydrated. In no apparent acute distress Mental status: Alert, oriented x 3 (person, place, & time)       Respiratory: No evidence of acute respiratory distress Eyes: PERLA Vitals: BP (!) 158/82 (BP Location: Right Arm, Patient Position: Sitting, Cuff Size: Large)    Pulse (!) 107    Temp 98 F (36.7 C) (Temporal)    Resp 16    Ht '5\' 3"'  (1.6 m)    Wt 300 lb (136.1 kg)    SpO2 97%    BMI 53.14 kg/m  BMI: Estimated body mass index is 53.14 kg/m as calculated from the following:   Height as of this encounter: '5\' 3"'  (1.6 m).   Weight as of this encounter: 300 lb (136.1 kg). Ideal:  Ideal body weight: 52.4 kg (115 lb 8.3 oz) Adjusted ideal body weight: 85.9 kg (189 lb 5 oz)  Bilateral knee pain, arthropathic arthralgia, bilateral ankle pain  Assessment   Status Diagnosis  Controlled Controlled Controlled 1. Chronic pain syndrome   2. Pharmacologic therapy   3. Long term current use of opiate analgesic   4. Lumbar spondylosis   5. Osteoarthritis involving multiple joints   6. Therapeutic opioid-induced constipation (OIC)   7. Chronic knee pain (Primary Area of Pain) (Bilateral) (R>L)   8. Musculoskeletal pain         Plan of Care   Ms. GHINA BITTINGER has a current medication list which includes the following long-term medication(s): albuterol, amlodipine, amlodipine, calcium carbonate, cholecalciferol, furosemide, levothyroxine, losartan,  magnesium oxide, meloxicam, spironolactone, [START ON 05/19/2021] hydrocodone-acetaminophen, [START ON 06/18/2021] hydrocodone-acetaminophen, [START ON 07/18/2021] hydrocodone-acetaminophen, potassium chloride cr, and benefiber.  Pharmacotherapy (Medications Ordered): Meds ordered this encounter  Medications   HYDROcodone-acetaminophen (NORCO) 10-325 MG tablet    Sig: Take 1 tablet by mouth every 6 (six) hours as needed for severe pain. Must last 30 days    Dispense:  120 tablet    Refill:  0    Chronic Pain: STOP Act (Not applicable) Fill 1 day early if closed on refill date.   HYDROcodone-acetaminophen (NORCO) 10-325 MG tablet    Sig: Take 1 tablet by mouth every 6 (six) hours as needed for severe pain. Must last 30 days    Dispense:  120 tablet    Refill:  0    Chronic Pain: STOP Act (Not applicable) Fill 1 day early if closed on refill date.   HYDROcodone-acetaminophen (NORCO) 10-325 MG tablet    Sig: Take 1 tablet by mouth every 6 (six) hours as needed for severe pain. Must last 30 days    Dispense:  120 tablet    Refill:  0    Chronic Pain: STOP Act (Not applicable) Fill 1 day early if closed on refill date.    Continue Magnesium as rx, can help with muscle cramps as well Continue Mobic prn  Follow-up plan:   Return in about 3 months (around 08/12/2021) for Medication Management, in person.   Recent Visits No visits were found meeting these conditions. Showing recent visits within past 90 days and meeting all other requirements Today's Visits Date Type Provider Dept  05/14/21 Office Visit Alison Santa, MD [facility]  Showing today's visits and meeting all other requirements Future Appointments No visits were found meeting these conditions. Showing future appointments within next 90 days and meeting all other requirements  I discussed the assessment and treatment plan with the patient. The patient was provided an opportunity to ask questions and all were answered. The patient agreed with the plan and demonstrated an understanding of the instructions.  Patient advised to call back or seek an in-person evaluation if the symptoms or condition worsens.  Duration of encounter: 30 minutes.  Note by: Alison Santa, MD Date: 05/14/2021; Time: 12:01 PM

## 2021-05-16 ENCOUNTER — Ambulatory Visit: Payer: 59 | Admitting: Physical Therapy

## 2021-05-17 ENCOUNTER — Encounter (INDEPENDENT_AMBULATORY_CARE_PROVIDER_SITE_OTHER): Payer: Self-pay

## 2021-06-07 NOTE — Therapy (Signed)
[facility] [address] [place], [place], [address] Phone: [phone]   Fax:  [phone]  Patient Details  Name: Alison Cox MRN: [id number] Date of Birth: [birth year] Referring Provider:  No ref. provider found  Encounter Date: 06/07/2021  PHYSICAL THERAPY DISCHARGE SUMMARY  Visits from Start of Care: 1  Current functional level related to goals / functional outcomes: Presented to initial evaluation with treatment provided. After initial evaluation, patient called [facility] and stated that vertigo has resolved and requested to cancel all remaining visits due to asymptomatic at this time.    Remaining deficits: None   Education / Equipment: None   Patient agrees to discharge. Patient goals were  not met due to inability to assess progress . Patient is being discharged due to the patient's request.   Jones Bales, PT, DPT 06/07/2021, 7:42 AM  [facility] [address] [place], [place], [address] Phone: [phone]   Fax:  [phone]

## 2021-06-24 ENCOUNTER — Ambulatory Visit: Payer: 59 | Admitting: Endocrinology

## 2021-07-03 ENCOUNTER — Ambulatory Visit: Payer: 59 | Admitting: Endocrinology

## 2021-07-03 ENCOUNTER — Encounter: Payer: Self-pay | Admitting: Endocrinology

## 2021-07-03 ENCOUNTER — Other Ambulatory Visit: Payer: Self-pay

## 2021-07-03 VITALS — BP 152/76 | HR 104 | Ht 63.0 in | Wt 317.6 lb

## 2021-07-03 DIAGNOSIS — E039 Hypothyroidism, unspecified: Secondary | ICD-10-CM | POA: Diagnosis not present

## 2021-07-03 NOTE — Progress Notes (Signed)
? ?Subjective:  ? ? Patient ID: Alison Cox, female    DOB: Nov 12, 1955, 66 y.o.   MRN: [id number] ? ?HPI ?Pt is referred by Dr Felipa Eth, for hypothyroidism and MNG.  Pt was noted to have chronic primary hypothyroidism in 2002, and MNG in 2008.  She had bx in 2013.  she has no h/o XRT or surgery to the neck.  She has been on her current 150 mcg/d, x many years.  She has cold intolerance and weight gain.  She does not notice the goiter.   ?Past Medical History:  ?Diagnosis Date  ? History of Bell's palsy 03/06/2015  ? History of Hashimoto thyroiditis 03/06/2015  ? History of nephrolithiasis 03/06/2015  ? Hypertension   ? Kidney stone   ? Macular corneal dystrophy 04/22/2003  ? Osteoarthritis   ? Squamous cell carcinoma of skin 10/30/2016  ? L near hairline sideburn - atypical squamous proliferation with cystic changes and LSC  ? Thyroid disease   ? ? ?Past Surgical History:  ?Procedure Laterality Date  ? CORNEAL TRANSPLANT    ? CORNEAL TRANSPLANT    ? DILATATION AND CURETTAGE    ? ENDOMETRIAL ABLATION    ? ? ?Social History  ? ?Socioeconomic History  ? Marital status: Married  ?  Spouse name: Not on file  ? Number of children: Not on file  ? Years of education: Not on file  ? Highest education level: Not on file  ?Occupational History  ? Not on file  ?Tobacco Use  ? Smoking status: Never  ? Smokeless tobacco: Never  ?Vaping Use  ? Vaping Use: Never used  ?Substance and Sexual Activity  ? Alcohol use: No  ?  Alcohol/week: 0.0 standard drinks  ? Drug use: No  ? Sexual activity: Not on file  ?Other Topics Concern  ? Not on file  ?Social History Narrative  ? Not on file  ? ?Social Determinants of Health  ? ?Financial Resource Strain: Not on file  ?Food Insecurity: Not on file  ?Transportation Needs: Not on file  ?Physical Activity: Not on file  ?Stress: Not on file  ?Social Connections: Not on file  ?Intimate Partner Violence: Not on file  ? ? ?Current Outpatient Medications on File Prior to Visit  ?Medication Sig  Dispense Refill  ? amLODipine (NORVASC) 5 MG tablet Take 1 tablet by mouth daily.    ? calcium carbonate (OSCAL) 1500 (600 Ca) MG TABS tablet Take by mouth 2 (two) times daily with a meal.    ? Ciclopirox 1 % shampoo APPLY TO AFFECTED AREA EVERY DAY 120 mL 11  ? Difluprednate 0.05 % EMUL Place 1 drop into the right eye 2 (two) times daily.    ? Fluocinolone Acetonide 0.01 % OIL Place 1 drop into both ears as needed.    ? Fluocinolone Acetonide Body 0.01 % OIL Apply to ears once or twice a day prn flares 120 mL 5  ? furosemide (LASIX) 20 MG tablet Take 20 mg by mouth 2 (two) times daily.    ? HYDROcodone-acetaminophen (NORCO) 10-325 MG tablet Take 1 tablet by mouth every 6 (six) hours as needed for severe pain. Must last 30 days 120 tablet 0  ? [START ON 07/18/2021] HYDROcodone-acetaminophen (NORCO) 10-325 MG tablet Take 1 tablet by mouth every 6 (six) hours as needed for severe pain. Must last 30 days 120 tablet 0  ? ketoconazole (NIZORAL) 2 % shampoo apply three times per week, massage into scalp and leave in for 10  minutes before rinsing out 120 mL 11  ? levothyroxine (SYNTHROID, LEVOTHROID) 150 MCG tablet Take 150 mcg by mouth daily before breakfast.    ? losartan (COZAAR) 25 MG tablet Take 25 mg by mouth daily.    ? meloxicam (MOBIC) 15 MG tablet Take 1 tablet (15 mg total) by mouth daily. 90 tablet 1  ? nystatin-triamcinolone (MYCOLOG II) cream APPLY SPARINGLY TO AFFECTED AREA TWICE A DAY    ? Potassium Chloride CR (MICRO-K) 8 MEQ CPCR capsule CR Take 1 capsule by mouth daily. with food    ? prednisoLONE acetate (PRED FORTE) 1 % ophthalmic suspension Place 1 drop into the right eye in the morning and at bedtime.    ? spironolactone (ALDACTONE) 25 MG tablet Take 25 mg by mouth daily.    ? albuterol (VENTOLIN HFA) 108 (90 Base) MCG/ACT inhaler Inhale 2 puffs into the lungs 3 (three) times daily as needed. (Patient not taking: Reported on 07/03/2021)    ? Cholecalciferol ([employer] VITAMIN D) 50 MCG (2000 UT) CAPS Take 1  capsule (2,000 Units total) by mouth daily. 30 capsule 11  ? Crisaborole 2 % OINT Apply 1 application topically as needed. (Patient not taking: Reported on 07/03/2021)    ? doxycycline (VIBRA-TABS) 100 MG tablet TAKE 1 TABLET BY MOUTH DAILY WITH FOOD (Patient not taking: Reported on 07/03/2021) 30 tablet 11  ? HYDROcodone-acetaminophen (NORCO) 10-325 MG tablet Take 1 tablet by mouth every 6 (six) hours as needed for severe pain. Must last 30 days 120 tablet 0  ? Ivermectin 1 % CREA Apply 1 application. topically as needed. (Patient not taking: Reported on 07/03/2021)    ? Ivermectin 1 % CREA Apply 1 application topically at bedtime. (Patient not taking: Reported on 07/03/2021) 45 g 11  ? Magnesium Oxide ([employer] MAGNESIUM OXIDE) 500 MG TABS Take 1 tablet (500 mg total) by mouth daily. 90 tablet 3  ? Wheat Dextrin (BENEFIBER) POWD Stir 2 teaspoons of Benefiber into 4-8 oz of any non-carbonated beverage or soft food (hot or cold) TID. 500 g PRN  ? ?No current facility-administered medications on file prior to visit.  ? ? ?Allergies  ?Allergen Reactions  ? Penicillins   ? Shellfish Allergy   ? Sulfa Antibiotics   ? ? ?Family History  ?Problem Relation Age of Onset  ? Hypertension Mother   ? Thyroid disease Father   ? Hypertension Father   ? ? ?BP (!) 152/76 (BP Location: Left Arm, Patient Position: Sitting, Cuff Size: Normal)   Pulse (!) 104   Ht '5\' 3"'$  (1.6 m)   Wt (!) 317 lb 9.6 oz (144.1 kg)   SpO2 96%   BMI 56.26 kg/m?  ? ? ?Review of Systems ?denies depression, memory loss, constipation, and dry skin.   ?   ?Objective:  ? Physical Exam ?VS: see vs page ?GEN: no distress ?HEAD: head: no deformity ?eyes: no periorbital swelling, no proptosis ?external nose and ears are normal ?NECK: I cannot palpate the goiter ?CHEST WALL: no deformity ?LUNGS: clear to auscultation ?CV: reg rate and rhythm, no murmur.  ?MUSCULOSKELETAL: gait is normal and steady ?EXTEMITIES: no deformity.  2= bilat leg edema ?NEURO:  readily moves all  4's.  sensation is intact to touch on all 4's ?SKIN:  Normal texture and temperature.  No rash or suspicious lesion is visible.   ?NODES:  None palpable at the neck ?PSYCH: alert, well-oriented.  Does not appear anxious nor depressed. ? ?Nuclear med scan on 03/24/02 done 4 months  postpartum showed cold nodule right lobe; 24 hour radioactive iodine uptake 25%.   ? ?[place] (2017) No new or enlarging thyroid nodules with all discretely measured ?thyroid nodules including previously biopsied dominant approximately ?2 cm nodule within the right lobe of thyroid appearing unchanged ?since most remote examination performed 02/2012. ?2. Nonspecific apparent mild interval atrophy of both the right and ?left lobes of the thyroid with the right lobe now measuring 3.2 cm ?in maximal diameter (previously, 5.1 cm) and the left lobe measuring ?3.3 cm (previously, 5.4 cm).   ? ?Right lobe bx (2013) nondiagnostic.   ? ?I have reviewed outside records, and summarized: ?Pt was noted to have MNG, and referred here.  Vertigo, HTN, and wellness were also addressed.   ? ?Lab Results  ?Component Value Date  ? TSH 0.64 07/03/2021  ? ?   ?Assessment & Plan:  ?MNG: we discussed.  She declines f/u US for now.   ?Hypothyroidism: well-controlled.  Please continue the same synthroid.  ? ?

## 2021-07-03 NOTE — Patient Instructions (Addendum)
Blood tests are requested for you today.  We'll let you know about the results.  Please come back for a follow-up appointment in 1 year.    

## 2021-07-04 LAB — TSH: TSH: 0.64 u[IU]/mL (ref 0.35–5.50)

## 2021-07-04 LAB — T4, FREE: Free T4: 1.3 ng/dL (ref 0.60–1.60)

## 2021-07-04 LAB — T3, FREE: T3, Free: 3.6 pg/mL (ref 2.3–4.2)

## 2021-08-06 ENCOUNTER — Encounter: Payer: Self-pay | Admitting: Student in an Organized Health Care Education/Training Program

## 2021-08-06 ENCOUNTER — Ambulatory Visit
Payer: 59 | Attending: Student in an Organized Health Care Education/Training Program | Admitting: Student in an Organized Health Care Education/Training Program

## 2021-08-06 DIAGNOSIS — Z79899 Other long term (current) drug therapy: Secondary | ICD-10-CM | POA: Insufficient documentation

## 2021-08-06 DIAGNOSIS — G894 Chronic pain syndrome: Secondary | ICD-10-CM | POA: Diagnosis present

## 2021-08-06 DIAGNOSIS — M47816 Spondylosis without myelopathy or radiculopathy, lumbar region: Secondary | ICD-10-CM | POA: Insufficient documentation

## 2021-08-06 DIAGNOSIS — Z79891 Long term (current) use of opiate analgesic: Secondary | ICD-10-CM | POA: Diagnosis present

## 2021-08-06 MED ORDER — HYDROCODONE-ACETAMINOPHEN 10-325 MG PO TABS: 1.0000 | ORAL_TABLET | Freq: Four times a day (QID) | ORAL | 0 refills | Status: DC | PRN

## 2021-08-06 NOTE — Progress Notes (Signed)
PROVIDER NOTE: Information contained herein reflects review and annotations entered in association with encounter. Interpretation of such information and data should be left to medically-trained personnel. Information provided to patient can be located elsewhere in the medical record under "Patient Instructions". Document created using STT-dictation technology, any transcriptional errors that may result from process are unintentional.  ?  ?Patient: Alison Cox  Service Category: E/M  Provider: Gillis Santa, MD  ?DOB: [birth year]  DOS: 08/06/2021  Specialty: Interventional Pain Management  ?MRN: [id number]  Setting: Ambulatory outpatient  PCP: Lajean Manes, MD  ?Type: Established Patient    Referring Provider: Lajean Manes, MD  ?Location: Office  Delivery: Face-to-face    ? ?HPI  ?Alison Cox, a 66 y.o. year old female, is here today because of her No primary diagnosis found.. Alison Cox primary complain today is Knee Pain (bilateral) ? ?Last encounter: My last encounter with her was on 05/14/21 ? ?Pertinent problems: Alison Cox has Long term current use of opiate analgesic; Long term prescription opiate use; Opiate use (40 MME/Day); Opioid dependence ([place]); Lumbar spondylosis; Grade 1 Anterolisthesis of L4 over L5; Chronic knee pain (Primary Area of Pain) (Bilateral) (R>L); Osteoarthritis of knees (Location of Primary Source of Pain) (Bilateral) (R>L); History of Hashimoto thyroiditis; Osteoarthrosis; Spondylolysis of lumbosacral region (Advanced disc degeneration and spondylosis L5-S1); Osteoarthritis of ankles (Location of Secondary source of pain) (Bilateral) (L>R); Chronic low back pain (Third area of Pain) (midline); Chronic pain syndrome; Musculoskeletal pain; Chronic ankle pain (Secondary area of Pain) (Bilateral) (L>R); and Osteoarthritis involving multiple joints on their pertinent problem list. ?Pain Assessment: Severity of Chronic pain is reported as a 0-No pain/10. Location: Knee Right, Left/up to  hips and mostly dong to ankles bilateral, left is worse. Onset: More than a month ago. Quality: Sharp, Aching, Discomfort, Dull, Sore. Timing: Intermittent. Modifying factor(s): rest, medicines. ?Vitals:  height is '5\' 3"'$  (1.6 m) and weight is 300 lb (136.1 kg). Her temperature is 97.5 ?F (36.4 ?C) (abnormal). Her blood pressure is 142/87 (abnormal) and her pulse is 89. Her respiration is 16 and oxygen saturation is 97%.  ? ?Reason for encounter: medication management.  ? ?No change in medical history since last visit.  Patient's pain is at baseline.  Patient continues multimodal pain regimen as prescribed.  States that it provides pain relief and improvement in functional status. ? ?Pharmacotherapy Assessment  ? ?Analgesic: Hydrocodone 10 mg 4 times daily as needed, quantity 120/month MME equals 40   ? ?Monitoring: ? PMP: PDMP reviewed during this encounter.       ?Pharmacotherapy: No side-effects or adverse reactions reported. ?Compliance: No problems identified. ?Effectiveness: Clinically acceptable. ? ?Ignatius Specking, RN  08/06/2021 10:08 AM  Sign when Signing Visit ?Nursing Pain Medication Assessment:  ?Safety precautions to be maintained throughout the outpatient stay will include: orient to surroundings, keep bed in low position, maintain call bell within reach at all times, provide assistance with transfer out of bed and ambulation.  ?Medication Inspection Compliance: Pill count conducted under aseptic conditions, in front of the patient. Neither the pills nor the bottle was removed from the patient's sight at any time. Once count was completed pills were immediately returned to the patient in their original bottle. ? ?Medication: Hydrocodone/APAP ?Pill/Patch Count:  48 of 120 pills remain ?Pill/Patch Appearance: Markings consistent with prescribed medication ?Bottle Appearance: Standard pharmacy container. Clearly labeled. ?Filled Date: 3 / 11 / 2023 ?Last Medication intake:  Today ?  ?  UDS:  ?Summary   ?  Date Value Ref Range Status  ?02/12/2021 Note  Final  ?  Comment:  ?  ==================================================================== ?[employer] Select 13 (MW) ?==================================================================== ?Test                             Result       Flag       Units ? ?Drug Present and Declared for Prescription Verification ?  Hydrocodone                    3980         EXPECTED   ng/mg creat ?  Hydromorphone                  554          EXPECTED   ng/mg creat ?  Dihydrocodeine                 681          EXPECTED   ng/mg creat ?  Norhydrocodone                 2197         EXPECTED   ng/mg creat ?   Sources of hydrocodone include scheduled prescription medications. ?   Hydromorphone, dihydrocodeine and norhydrocodone are expected ?   metabolites of hydrocodone. Hydromorphone and dihydrocodeine are ?   also available as scheduled prescription medications. ? ?==================================================================== ?Test                      Result    Flag   Units      Ref Range ?  Creatinine              226              mg/dL      >=20 ?==================================================================== ?Declared Medications: ? The flagging and interpretation on this report are based on the ? following declared medications.  Unexpected results may arise from ? inaccuracies in the declared medications. ? ? **Note: The testing scope of this panel includes these medications: ? ? Hydrocodone (Norco) ? ? **Note: The testing scope of this panel does not include the ? following reported medications: ? ? Acetaminophen (Norco) ? Amlodipine (Norvasc) ? Calcium ? Cholecalciferol ? Ciclopirox ? Crisaborole ? Doxycycline ? Eye Drop ? Fluocinolone ? Furosemide (Lasix) ? Ivermectin ? Ketoconazole ? Levothyroxine (Synthroid) ? Magnesium (Mag-Ox) ? Meloxicam (Mobic) ? Nystatin ? Spironolactone (Aldactone) ? Supplement (Fiber) ?  Triamcinolone ?==================================================================== ?For clinical consultation, please call [phone]. ?==================================================================== ?  ?  ? ?ROS  ?Constitutional: Denies any fever or chills ?Gastrointestinal: No reported hemesis, hematochezia, vomiting, or acute GI distress ?Musculoskeletal:  +LBP , b/l knee pain ?Neurological: No reported episodes of acute onset apraxia, aphasia, dysarthria, agnosia, amnesia, paralysis, loss of coordination, or loss of consciousness ? ?Medication Review  ?Benefiber, Cholecalciferol, Ciclopirox, Crisaborole, Difluprednate, Fluocinolone Acetonide, Fluocinolone Acetonide Body, HYDROcodone-acetaminophen, Ivermectin, Magnesium Oxide, Potassium Chloride CR, albuterol, amLODipine, calcium carbonate, doxycycline, furosemide, ketoconazole, levothyroxine, losartan, meloxicam, nystatin-triamcinolone, prednisoLONE acetate, and spironolactone ? ?History Review  ?Allergy: Alison Cox is allergic to penicillins, shellfish allergy, and sulfa antibiotics. ?Drug: Alison Cox  reports no history of drug use. ?Alcohol:  reports no history of alcohol use. ?Tobacco:  reports that she has never smoked. She has never used smokeless tobacco. ?Social: Alison Cox  reports that she has never smoked. She has never used smokeless tobacco. She  reports that she does not drink alcohol and does not use drugs. 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 ?Surgical: Alison Cox  has a past surgical history that includes Endometrial ablation; DILATATION AND CURETTAGE; Corneal transplant; and Corneal transplant. ?Family: family history includes Hypertension in her father and  mother; Thyroid disease in her father. ? ?Laboratory Chemistry Profile  ? ?Renal ?Lab Results  ?Component Value Date  ? BUN 14 01/17/2019  ? CREATININE 1.00 01/17/2019  ? BCR 14 01/17/2019  ? GFRAA 70 01/17/2019  ? GFRNONAA 61 01/17/2019  ? ?  Hepatic ?Lab Results

## 2021-08-06 NOTE — Progress Notes (Signed)
Nursing Pain Medication Assessment:  ?Safety precautions to be maintained throughout the outpatient stay will include: orient to surroundings, keep bed in low position, maintain call bell within reach at all times, provide assistance with transfer out of bed and ambulation.  ?Medication Inspection Compliance: Pill count conducted under aseptic conditions, in front of the patient. Neither the pills nor the bottle was removed from the patient's sight at any time. Once count was completed pills were immediately returned to the patient in their original bottle. ? ?Medication: Hydrocodone/APAP ?Pill/Patch Count:  48 of 120 pills remain ?Pill/Patch Appearance: Markings consistent with prescribed medication ?Bottle Appearance: Standard pharmacy container. Clearly labeled. ?Filled Date: 3 / 67 / 2023 ?Last Medication intake:  Today ?

## 2021-10-17 ENCOUNTER — Other Ambulatory Visit: Payer: Self-pay

## 2021-10-17 ENCOUNTER — Telehealth: Payer: Self-pay

## 2021-10-17 DIAGNOSIS — Z79891 Long term (current) use of opiate analgesic: Secondary | ICD-10-CM

## 2021-10-17 DIAGNOSIS — Z79899 Other long term (current) drug therapy: Secondary | ICD-10-CM

## 2021-10-17 DIAGNOSIS — M47816 Spondylosis without myelopathy or radiculopathy, lumbar region: Secondary | ICD-10-CM

## 2021-10-17 DIAGNOSIS — G894 Chronic pain syndrome: Secondary | ICD-10-CM

## 2021-10-17 MED ORDER — HYDROCODONE-ACETAMINOPHEN 10-325 MG PO TABS: 1.0000 | ORAL_TABLET | Freq: Four times a day (QID) | ORAL | 0 refills | Status: DC | PRN

## 2021-10-17 NOTE — Telephone Encounter (Signed)
[facility] [place] Has her medicine. Please send script to them today, she is out of meds.

## 2021-10-17 NOTE — Telephone Encounter (Signed)
Her pharmacy doesn't have her meds. I told her to call around and try to find somewhere that has it and let us know if she finds somewhere. I didn't get any response from her and her  phone disconnected for some reason. I will assume she will call back if she finds somewhere for [place] to send the script.

## 2021-10-17 NOTE — Telephone Encounter (Signed)
Refill request sent to Dr Lateef.  

## 2021-10-17 NOTE — Telephone Encounter (Signed)
Patient is calling to see if she can pick up her meds at [facility] ?

## 2021-10-31 ENCOUNTER — Encounter: Payer: Self-pay | Admitting: Student in an Organized Health Care Education/Training Program

## 2021-10-31 ENCOUNTER — Ambulatory Visit
Payer: 59 | Attending: Student in an Organized Health Care Education/Training Program | Admitting: Student in an Organized Health Care Education/Training Program

## 2021-10-31 VITALS — BP 144/72 | HR 91 | Temp 98.0°F | Resp 18 | Ht 63.0 in | Wt 300.0 lb

## 2021-10-31 DIAGNOSIS — Z79891 Long term (current) use of opiate analgesic: Secondary | ICD-10-CM

## 2021-10-31 DIAGNOSIS — M25561 Pain in right knee: Secondary | ICD-10-CM | POA: Insufficient documentation

## 2021-10-31 DIAGNOSIS — G894 Chronic pain syndrome: Secondary | ICD-10-CM

## 2021-10-31 DIAGNOSIS — K5903 Drug induced constipation: Secondary | ICD-10-CM

## 2021-10-31 DIAGNOSIS — Z79899 Other long term (current) drug therapy: Secondary | ICD-10-CM | POA: Insufficient documentation

## 2021-10-31 DIAGNOSIS — M25562 Pain in left knee: Secondary | ICD-10-CM | POA: Diagnosis present

## 2021-10-31 DIAGNOSIS — M159 Polyosteoarthritis, unspecified: Secondary | ICD-10-CM | POA: Diagnosis present

## 2021-10-31 DIAGNOSIS — M25572 Pain in left ankle and joints of left foot: Secondary | ICD-10-CM | POA: Diagnosis present

## 2021-10-31 DIAGNOSIS — G8929 Other chronic pain: Secondary | ICD-10-CM | POA: Diagnosis present

## 2021-10-31 DIAGNOSIS — T402X5A Adverse effect of other opioids, initial encounter: Secondary | ICD-10-CM | POA: Diagnosis present

## 2021-10-31 DIAGNOSIS — M7918 Myalgia, other site: Secondary | ICD-10-CM

## 2021-10-31 DIAGNOSIS — M47816 Spondylosis without myelopathy or radiculopathy, lumbar region: Secondary | ICD-10-CM

## 2021-10-31 DIAGNOSIS — M25571 Pain in right ankle and joints of right foot: Secondary | ICD-10-CM | POA: Diagnosis present

## 2021-10-31 MED ORDER — HYDROCODONE-ACETAMINOPHEN 10-325 MG PO TABS: 1.0000 | ORAL_TABLET | Freq: Four times a day (QID) | ORAL | 0 refills | Status: DC | PRN

## 2021-10-31 NOTE — Progress Notes (Signed)
Nursing Pain Medication Assessment:  Safety precautions to be maintained throughout the outpatient stay will include: orient to surroundings, keep bed in low position, maintain call bell within reach at all times, provide assistance with transfer out of bed and ambulation.  Medication Inspection Compliance: Pill count conducted under aseptic conditions, in front of the patient. Neither the pills nor the bottle was removed from the patient's sight at any time. Once count was completed pills were immediately returned to the patient in their original bottle.  Medication: Hydrocodone/APAP Pill/Patch Count:  62 of 120 pills remain Pill/Patch Appearance: Markings consistent with prescribed medication Bottle Appearance: Standard pharmacy container. Clearly labeled. Filled Date: 06 / 29 / 2023 Last Medication intake:  Today

## 2021-10-31 NOTE — Progress Notes (Signed)
PROVIDER NOTE: Information contained herein reflects review and annotations entered in association with encounter. Interpretation of such information and data should be left to medically-trained personnel. Information provided to patient can be located elsewhere in the medical record under "Patient Instructions". Document created using STT-dictation technology, any transcriptional errors that may result from process are unintentional.    Patient: Alison Cox  Service Category: E/M  Provider: Gillis Santa, MD  DOB: [birth year]  DOS: 10/31/2021  Specialty: Interventional Pain Management  MRN: [id number]  Setting: Ambulatory outpatient  PCP: Lajean Manes, MD  Type: Established Patient    Referring Provider: Lajean Manes, MD  Location: Office  Delivery: Face-to-face     HPI  [name], a 66 y.o. year old female, is here today because of her Chronic pain syndrome [G89.4]. Ms. Cisse primary complain today is Knee Pain (bilat) and Joint Pain  Last encounter: My last encounter with her was on 08/06/21  Pertinent problems: Ms. Omara has Long term current use of opiate analgesic; Long term prescription opiate use; Opiate use (40 MME/Day); Opioid dependence ([place]); Lumbar spondylosis; Grade 1 Anterolisthesis of L4 over L5; Chronic knee pain (Primary Area of Pain) (Bilateral) (R>L); Osteoarthritis of knees (Location of Primary Source of Pain) (Bilateral) (R>L); History of Hashimoto thyroiditis; Osteoarthrosis; Spondylolysis of lumbosacral region (Advanced disc degeneration and spondylosis L5-S1); Osteoarthritis of ankles (Location of Secondary source of pain) (Bilateral) (L>R); Chronic low back pain (Third area of Pain) (midline); Chronic pain syndrome; Musculoskeletal pain; Chronic ankle pain (Secondary area of Pain) (Bilateral) (L>R); and Osteoarthritis involving multiple joints on their pertinent problem list. Pain Assessment: Severity of Chronic pain is reported as a 6 /10. Location: Knee Right,  Left/sometimes up to hips; LEFT knee is worse. Onset: More than a month ago. Quality: Aching. Timing: Constant. Modifying factor(s): meds. Vitals:  height is _0  (1.6 m) and weight is 300 lb (136.1 kg). Her temporal temperature is 98 F (36.7 C). Her blood pressure is 144/72 (abnormal) and her pulse is 91. Her respiration is 18 and oxygen saturation is 97%.   Reason for encounter: medication management.   No change in medical history since last visit.  Patient's pain is at baseline.  Patient continues multimodal pain regimen as prescribed.  States that it provides pain relief and improvement in functional status.  Pharmacotherapy Assessment   Analgesic: Hydrocodone 10 mg 4 times daily as needed, quantity 120/month MME equals 40    Monitoring: [place] PMP: PDMP reviewed during this encounter.       Pharmacotherapy: No side-effects or adverse reactions reported. Compliance: No problems identified. Effectiveness: Clinically acceptable.  [name], RN  10/31/2021 11:05 AM  Sign when Signing Visit Nursing Pain Medication Assessment:  Safety precautions to be maintained throughout the outpatient stay will include: orient to surroundings, keep bed in low position, maintain call bell within reach at all times, provide assistance with transfer out of bed and ambulation.  Medication Inspection Compliance: Pill count conducted under aseptic conditions, in front of the patient. Neither the pills nor the bottle was removed from the patient's sight at any time. Once count was completed pills were immediately returned to the patient in their original bottle.  Medication: Hydrocodone/APAP Pill/Patch Count:  62 of 120 pills remain Pill/Patch Appearance: Markings consistent with prescribed medication Bottle Appearance: Standard pharmacy container. Clearly labeled. Filled Date: 06 / 29 / 2023 Last Medication intake:  Today     UDS:  Summary  Date Value Ref Range Status  02/12/2021 Note  Final     Comment:    ==================================================================== [employer] Select 13 (MW) ==================================================================== Test                             Result       Flag       Units  Drug Present and Declared for Prescription Verification   Hydrocodone                    3980         EXPECTED   ng/mg creat   Hydromorphone                  554          EXPECTED   ng/mg creat   Dihydrocodeine                 681          EXPECTED   ng/mg creat   Norhydrocodone                 2197         EXPECTED   ng/mg creat    Sources of hydrocodone include scheduled prescription medications.    Hydromorphone, dihydrocodeine and norhydrocodone are expected    metabolites of hydrocodone. Hydromorphone and dihydrocodeine are    also available as scheduled prescription medications.  ==================================================================== Test                      Result    Flag   Units      Ref Range   Creatinine              226              mg/dL      >=20 ==================================================================== Declared Medications:  The flagging and interpretation on this report are based on the  following declared medications.  Unexpected results may arise from  inaccuracies in the declared medications.   **Note: The testing scope of this panel includes these medications:   Hydrocodone (Norco)   **Note: The testing scope of this panel does not include the  following reported medications:   Acetaminophen (Norco)  Amlodipine (Norvasc)  Calcium  Cholecalciferol  Ciclopirox  Crisaborole  Doxycycline  Eye Drop  Fluocinolone  Furosemide (Lasix)  Ivermectin  Ketoconazole  Levothyroxine (Synthroid)  Magnesium (Mag-Ox)  Meloxicam (Mobic)  Nystatin  Spironolactone (Aldactone)  Supplement (Fiber)  Triamcinolone ==================================================================== For clinical consultation,  please call [phone]. ====================================================================      ROS  Constitutional: Denies any fever or chills Gastrointestinal: No reported hemesis, hematochezia, vomiting, or acute GI distress Musculoskeletal:  +LBP , hip pain, knee pain bilateral Neurological: No reported episodes of acute onset apraxia, aphasia, dysarthria, agnosia, amnesia, paralysis, loss of coordination, or loss of consciousness  Medication Review  Cholecalciferol, Ciclopirox, Crisaborole, Difluprednate, Fluocinolone Acetonide, Fluocinolone Acetonide Body, HYDROcodone-acetaminophen, Ivermectin, Magnesium Oxide -Mg Supplement, albuterol, amLODipine, calcium carbonate, doxycycline, furosemide, ketoconazole, levothyroxine, losartan, meloxicam, nystatin-triamcinolone, and spironolactone  History Review  Allergy: Ms. Pinard is allergic to penicillins, shellfish allergy, and sulfa antibiotics. Drug: Ms. Odeh  reports no history of drug use. Alcohol:  reports no history of alcohol use. Tobacco:  reports that she has never smoked. She has never used smokeless tobacco. Social: Ms. Barra  reports that she has never smoked. She has never used smokeless tobacco. She reports that she does not drink alcohol and does not  use drugs. 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 Surgical: Ms. Cobbins  has a past surgical history that includes Endometrial ablation; DILATATION AND CURETTAGE; Corneal transplant; and Corneal transplant. Family: family history includes Hypertension in her father and mother; Thyroid disease in her father.  Laboratory Chemistry Profile   Renal Lab Results  Component Value Date   BUN 14 01/17/2019    CREATININE 1.00 01/17/2019   BCR 14 01/17/2019   GFRAA 70 01/17/2019   GFRNONAA 61 01/17/2019     Hepatic Lab Results  Component Value Date   AST 32 01/17/2019   ALT 47 05/01/2016   ALBUMIN 4.8 01/17/2019   ALKPHOS 112 01/17/2019     Electrolytes Lab Results  Component Value Date   NA 141 01/17/2019   K 4.2 01/17/2019   CL 100 01/17/2019   CALCIUM 10.5 (H) 01/17/2019   MG 2.1 01/17/2019     Bone Lab Results  Component Value Date   25OHVITD1 36 01/17/2019   25OHVITD2 <1.0 01/17/2019   25OHVITD3 35 01/17/2019     Inflammation (CRP: Acute Phase) (ESR: Chronic Phase) Lab Results  Component Value Date   CRP 11 (H) 01/17/2019   ESRSEDRATE 39 01/17/2019       Note: Above Lab results reviewed.  Recent Imaging Review  DG Chest 2 View CLINICAL DATA:  Shortness of breath for several weeks  EXAM: CHEST - 2 VIEW  COMPARISON:  9/2/9  FINDINGS: The heart size and mediastinal contours are within normal limits. Both lungs are clear. The visualized skeletal structures are unremarkable.  IMPRESSION: No active cardiopulmonary disease.  Electronically Signed   By: Inez Catalina M.D.   On: 09/16/2019 08:40  Note: Reviewed        Physical Exam  General appearance: Well nourished, well developed, and well hydrated. In no apparent acute distress Mental status: Alert, oriented x 3 (person, place, & time)       Respiratory: No evidence of acute respiratory distress Eyes: PERLA Vitals: BP (!) 144/72   Pulse 91   Temp 98 F (36.7 C) (Temporal)   Resp 18   Ht _0  (1.6 m)   Wt 300 lb (136.1 kg)   SpO2 97%   BMI 53.14 kg/m  BMI: Estimated body mass index is 53.14 kg/m as calculated from the following:   Height as of this encounter: _1  (1.6 m).   Weight as of this encounter: 300 lb (136.1 kg). Ideal: Ideal body weight: 52.4 kg (115 lb 8.3 oz) Adjusted ideal body weight: 85.9 kg (189 lb 5 oz)  Bilateral knee pain, arthropathic arthralgia, bilateral ankle  pain  Assessment   Status Diagnosis  Controlled Controlled Controlled 1. Chronic pain syndrome   2. Pharmacologic therapy   3. Long term current use of opiate analgesic   4. Lumbar spondylosis   5. Osteoarthritis involving multiple joints   6. Therapeutic opioid-induced constipation (OIC)   7. Chronic knee pain (Primary Area of Pain) (Bilateral) (R>L)   8. Musculoskeletal pain   9. Chronic ankle pain (Secondary area of Pain) (Bilateral) (L>R)         Plan of Care   Ms. WRETHA LARIS has a current medication list which includes the following long-term medication(s): albuterol, amlodipine, calcium carbonate, cholecalciferol, furosemide, levothyroxine, losartan, magnesium oxide -mg supplement, meloxicam, spironolactone, [  START ON 11/16/2021] hydrocodone-acetaminophen, [START ON 12/16/2021] hydrocodone-acetaminophen, and [START ON 01/15/2022] hydrocodone-acetaminophen.  Pharmacotherapy (Medications Ordered): Meds ordered this encounter  Medications   HYDROcodone-acetaminophen (NORCO) 10-325 MG tablet    Sig: Take 1 tablet by mouth every 6 (six) hours as needed for severe pain. Must last 30 days    Dispense:  120 tablet    Refill:  0    Chronic Pain: STOP Act (Not applicable) Fill 1 day early if closed on refill date.   HYDROcodone-acetaminophen (NORCO) 10-325 MG tablet    Sig: Take 1 tablet by mouth every 6 (six) hours as needed for severe pain. Must last 30 days    Dispense:  120 tablet    Refill:  0    Chronic Pain: STOP Act (Not applicable) Fill 1 day early if closed on refill date.   HYDROcodone-acetaminophen (NORCO) 10-325 MG tablet    Sig: Take 1 tablet by mouth every 6 (six) hours as needed for severe pain. Must last 30 days    Dispense:  120 tablet    Refill:  0    Chronic Pain: STOP Act (Not applicable) Fill 1 day early if closed on refill date.   Continue Magnesium as rx, can help with muscle cramps as well Continue Mobic prn UDS up to date, appropriate  Follow-up  plan:   Return in about 3 months (around 01/31/2022) for Medication Management, in person.   Recent Visits Date Type Provider Dept  08/06/21 Office Visit Gillis Santa, MD [facility]  Showing recent visits within past 90 days and meeting all other requirements Today's Visits Date Type Provider Dept  10/31/21 Office Visit Gillis Santa, MD [facility]  Showing today's visits and meeting all other requirements Future Appointments Date Type Provider Dept  01/23/22 Appointment Gillis Santa, MD [facility]  Showing future appointments within next 90 days and meeting all other requirements  I discussed the assessment and treatment plan with the patient. The patient was provided an opportunity to ask questions and all were answered. The patient agreed with the plan and demonstrated an understanding of the instructions.  Patient advised to call back or seek an in-person evaluation if the symptoms or condition worsens.  Duration of encounter: 30 minutes.  Note by: Gillis Santa, MD Date: 10/31/2021; Time: 11:52 AM

## 2021-11-05 ENCOUNTER — Other Ambulatory Visit: Payer: Self-pay | Admitting: Student in an Organized Health Care Education/Training Program

## 2021-11-05 DIAGNOSIS — M159 Polyosteoarthritis, unspecified: Secondary | ICD-10-CM

## 2021-11-06 ENCOUNTER — Other Ambulatory Visit: Payer: Self-pay | Admitting: Dermatology

## 2021-11-27 ENCOUNTER — Encounter (INDEPENDENT_AMBULATORY_CARE_PROVIDER_SITE_OTHER): Payer: Self-pay

## 2021-12-09 ENCOUNTER — Other Ambulatory Visit: Payer: Self-pay | Admitting: Dermatology

## 2021-12-18 ENCOUNTER — Ambulatory Visit (INDEPENDENT_AMBULATORY_CARE_PROVIDER_SITE_OTHER): Payer: 59 | Admitting: Dermatology

## 2021-12-18 ENCOUNTER — Encounter: Payer: Self-pay | Admitting: Dermatology

## 2021-12-18 DIAGNOSIS — Z79899 Other long term (current) drug therapy: Secondary | ICD-10-CM | POA: Diagnosis not present

## 2021-12-18 DIAGNOSIS — L719 Rosacea, unspecified: Secondary | ICD-10-CM | POA: Diagnosis not present

## 2021-12-18 DIAGNOSIS — L219 Seborrheic dermatitis, unspecified: Secondary | ICD-10-CM

## 2021-12-18 DIAGNOSIS — L720 Epidermal cyst: Secondary | ICD-10-CM | POA: Diagnosis not present

## 2021-12-18 MED ORDER — KETOCONAZOLE 2 % EX SHAM: MEDICATED_SHAMPOO | CUTANEOUS | 11 refills | Status: DC

## 2021-12-18 MED ORDER — FLUOCINOLONE ACETONIDE BODY 0.01 % EX OIL: TOPICAL_OIL | CUTANEOUS | 5 refills | Status: DC

## 2021-12-18 MED ORDER — DOXYCYCLINE HYCLATE 100 MG PO TABS: 100.0000 mg | ORAL_TABLET | Freq: Every day | ORAL | 3 refills | Status: DC

## 2021-12-18 NOTE — Patient Instructions (Addendum)
Start over the counter Differin cream for Milia   Due to recent changes in healthcare laws, you may see results of your pathology and/or laboratory studies on MyChart before the doctors have had a chance to review them. We understand that in some cases there may be results that are confusing or concerning to you. Please understand that not all results are received at the same time and often the doctors may need to interpret multiple results in order to provide you with the best plan of care or course of treatment. Therefore, we ask that you please give [place] 2 business days to thoroughly review all your results before contacting the office for clarification. Should we see a critical lab result, you will be contacted sooner.   If You Need Anything After Your Visit  If you have any questions or concerns for your doctor, please call our main line at [phone] and press option 4 to reach your doctor's medical assistant. If no one answers, please leave a voicemail as directed and we will return your call as soon as possible. Messages left after 4 pm will be answered the following business day.   You may also send [place] a message via [place]. We typically respond to MyChart messages within 1-2 business days.  For prescription refills, please ask your pharmacy to contact our office. Our fax number is [phone].  If you have an urgent issue when the clinic is closed that cannot wait until the next business day, you can page your doctor at the number below.    Please note that while we do our best to be available for urgent issues outside of office hours, we are not available 24/7.   If you have an urgent issue and are unable to reach [place], you may choose to seek medical care at your doctor's office, retail clinic, urgent care center, or emergency room.  If you have a medical emergency, please immediately call 911 or go to the emergency department.  Pager Numbers  - Dr. Nehemiah Massed: [phone]  - Dr.  Laurence Ferrari: [phone]  - Dr. Nicole Kindred: [phone]  In the event of inclement weather, please call our main line at [phone] for an update on the status of any delays or closures.  Dermatology Medication Tips: Please keep the boxes that topical medications come in in order to help keep track of the instructions about where and how to use these. Pharmacies typically print the medication instructions only on the boxes and not directly on the medication tubes.   If your medication is too expensive, please contact our office at [phone] option 4 or send [place] a message through [place].   We are unable to tell what your co-pay for medications will be in advance as this is different depending on your insurance coverage. However, we may be able to find a substitute medication at lower cost or fill out paperwork to get insurance to cover a needed medication.   If a prior authorization is required to get your medication covered by your insurance company, please allow [place] 1-2 business days to complete this process.  Drug prices often vary depending on where the prescription is filled and some pharmacies may offer cheaper prices.  The website www.goodrx.com contains coupons for medications through different pharmacies. The prices here do not account for what the cost may be with help from insurance (it may be cheaper with your insurance), but the website can give you the price if you did not use any insurance.  -  You can print the associated coupon and take it with your prescription to the pharmacy.  - You may also stop by our office during regular business hours and pick up a GoodRx coupon card.  - If you need your prescription sent electronically to a different pharmacy, notify our office through [facility] or by phone at [phone] option 4.     Si Usted Necesita Algo Despus de Su Visita  Tambin puede enviarnos un mensaje a travs de Pharmacist, community. Por lo general respondemos a los mensajes  de MyChart en el transcurso de 1 a 2 das hbiles.  Para renovar recetas, por favor pida a su farmacia que se ponga en contacto con nuestra oficina. Harland Dingwall de fax es [place] [phone].  Si tiene un asunto urgente cuando la clnica est cerrada y que no puede esperar hasta el siguiente da hbil, puede llamar/localizar a su doctor(a) al nmero que aparece a continuacin.   Por favor, tenga en cuenta que aunque hacemos todo lo posible para estar disponibles para asuntos urgentes fuera del horario de [place], no estamos disponibles las 24 horas del da, los 7 das de la [place].   Si tiene un problema urgente y no puede comunicarse con nosotros, puede optar por buscar atencin mdica  en el consultorio de su doctor(a), en una clnica privada, en un centro de atencin urgente o en una sala de emergencias.  Si tiene Engineering geologist, por favor llame inmediatamente al 911 o vaya a la sala de emergencias.  Nmeros de bper  - Dr. Nehemiah Massed: [phone]  - Dra. Moye: [phone]  - Dra. Nicole Kindred: [phone]  En caso de inclemencias del [place], por favor llame a Johnsie Kindred principal al [phone] para una actualizacin sobre el [place] de cualquier retraso o cierre.  Consejos para la medicacin en dermatologa: Por favor, guarde las cajas en las que vienen los medicamentos de uso tpico para ayudarle a seguir las instrucciones sobre dnde y cmo usarlos. Las farmacias generalmente imprimen las instrucciones del medicamento slo en las cajas y no directamente en los tubos del [place].   Si su medicamento es muy caro, por favor, pngase en contacto con Zigmund Daniel llamando al [phone] y presione la opcin 4 o envenos un mensaje a travs de Pharmacist, community.   No podemos decirle cul ser su copago por los medicamentos por adelantado ya que esto es diferente dependiendo de la cobertura de su seguro. Sin embargo, es posible que podamos encontrar un medicamento sustituto a Actor un formulario para que el seguro cubra el medicamento que se considera necesario.   Si se requiere una autorizacin previa para que su compaa de seguros [place] su medicamento, por favor permtanos de 1 a 2 das hbiles para completar este proceso.  Los precios de los medicamentos varan con frecuencia dependiendo del Environmental consultant de dnde se surte la receta y alguna farmacias pueden ofrecer precios ms baratos.  El sitio web www.goodrx.com tiene cupones para medicamentos de Airline pilot. Los precios aqu no tienen en cuenta lo que podra costar con la ayuda del seguro (puede ser ms barato con su seguro), pero el sitio web puede darle el precio si no utiliz Research scientist (physical sciences).  - Puede imprimir el cupn correspondiente y llevarlo con su receta a la farmacia.  - Tambin puede pasar por nuestra oficina durante el horario de atencin regular y Charity fundraiser una tarjeta de cupones de GoodRx.  - Si necesita que su receta se enve electrnicamente a una farmacia diferente, informe  a nuestra oficina a travs de MyChart de [place] o por telfono llamando al [phone] y presione la opcin 4.

## 2021-12-18 NOTE — Progress Notes (Signed)
   Follow-Up Visit   Subjective  Alison Cox is a 66 y.o. female who presents for the following: Rosacea (1 month f/u on Rosacea on her face, treating with Doxycycline 100 mg once a day and Ivermectin cream with a good response.).  The following portions of the chart were reviewed this encounter and updated as appropriate:   Tobacco  Allergies  Meds  Problems  Med Hx  Surg Hx  Fam Hx     Review of Systems:  No other skin or systemic complaints except as noted in HPI or Assessment and Plan.  Objective  Well appearing patient in no apparent distress; mood and affect are within normal limits.  A focused examination was performed including face. Relevant physical exam findings are noted in the Assessment and Plan.  scalp and ears Pink patches with greasy scale.   face Smooth white papule(s).    Assessment & Plan  Rosacea face  Rosacea is a chronic progressive skin condition usually affecting the face of adults, causing redness and/or acne bumps. It is treatable but not curable. It sometimes affects the eyes (ocular rosacea) as well. It may respond to topical and/or systemic medication and can flare with stress, sun exposure, alcohol, exercise and some foods.  Daily application of broad spectrum spf 30+ sunscreen to face is recommended to reduce flares.   Discussed Isotretinoin therapy an option, pt decline Isotretinoin   Cont Doxycycline 100 mg once a day  Cont Ivermectin cream once a day   Related Medications Ivermectin 1 % CREA Apply 1 application topically at bedtime.  doxycycline (VIBRA-TABS) 100 MG tablet Take 1 tablet (100 mg total) by mouth daily. with food  Seborrheic dermatitis scalp and ears  Seborrheic Dermatitis  -  is a chronic persistent rash characterized by pinkness and scaling most commonly of the mid face but also can occur on the scalp (dandruff), ears; mid chest, mid back and groin.  It tends to be exacerbated by stress and cooler weather.  People  who have neurologic disease may experience new onset or exacerbation of existing seborrheic dermatitis.  The condition is not curable but treatable and can be controlled.   Cont Ketoconazole shampoo as directed  Cont Fluocinolone oil as directed to ears  Related Medications ketoconazole (NIZORAL) 2 % shampoo apply three times per week, massage into scalp and leave in for 10 minutes before rinsing out  Fluocinolone Acetonide Body 0.01 % OIL Apply to ears once or twice a day prn flares  Milia face  Start otc Differin cream apply to affected skin at bedtime    Return in about 1 year (around 12/19/2022) for Rosacea, Seborrheic dermatitis .  IMarye Round, CMA, am acting as scribe for Sarina Ser, MD .  Documentation: I have reviewed the above documentation for accuracy and completeness, and I agree with the above.  Sarina Ser, MD

## 2022-01-15 ENCOUNTER — Other Ambulatory Visit: Payer: Self-pay | Admitting: Dermatology

## 2022-01-15 DIAGNOSIS — L719 Rosacea, unspecified: Secondary | ICD-10-CM

## 2022-01-23 ENCOUNTER — Encounter: Payer: 59 | Admitting: Student in an Organized Health Care Education/Training Program

## 2022-01-28 ENCOUNTER — Other Ambulatory Visit: Payer: Self-pay | Admitting: Internal Medicine

## 2022-01-28 DIAGNOSIS — E041 Nontoxic single thyroid nodule: Secondary | ICD-10-CM | POA: Diagnosis not present

## 2022-01-28 DIAGNOSIS — E039 Hypothyroidism, unspecified: Secondary | ICD-10-CM | POA: Diagnosis not present

## 2022-01-28 DIAGNOSIS — E063 Autoimmune thyroiditis: Secondary | ICD-10-CM | POA: Diagnosis not present

## 2022-02-03 ENCOUNTER — Telehealth: Payer: Self-pay | Admitting: Student in an Organized Health Care Education/Training Program

## 2022-02-03 NOTE — Telephone Encounter (Signed)
Patient has called stating her new [employer] does not cover Dr. Holley Raring. She is wanting to change over to Dr. Dossie Arbour. She is out of meds on 02-14-22. Please check with Dr. Dossie Arbour and let me know

## 2022-02-04 NOTE — Telephone Encounter (Deleted)
PT left vm with fax number to att: Dr Vanessa Barbara [phone].

## 2022-02-10 ENCOUNTER — Encounter: Payer: Medicare Other | Admitting: Pain Medicine

## 2022-02-11 DIAGNOSIS — E063 Autoimmune thyroiditis: Secondary | ICD-10-CM | POA: Diagnosis not present

## 2022-02-11 DIAGNOSIS — G8929 Other chronic pain: Secondary | ICD-10-CM | POA: Diagnosis not present

## 2022-02-11 DIAGNOSIS — R300 Dysuria: Secondary | ICD-10-CM | POA: Diagnosis not present

## 2022-02-11 NOTE — Patient Instructions (Incomplete)
____________________________________________________________________________________________  Patient Information update  To: All of our patients.  Re: Name change.  It has been made official that our current name, "[facility]"   will soon be changed to "[facility]".   The purpose of this change is to eliminate any confusion created by the concept of our practice being a "Medication Management Pain Clinic". In the past this has led to the misconception that we treat pain primarily by the use of prescription medications.  Nothing can be farther from the truth.   Understanding PAIN MANAGEMENT: To further understand what our practice does, you first have to understand that "Pain Management" is a subspecialty that requires additional training once a physician has completed their specialty training, which can be in either Anesthesia, Neurology, Psychiatry, or Physical Medicine and Rehabilitation (PMR). Each one of these contributes to the final approach taken by each physician to the management of their patient's pain. To be a "Pain Management Specialist" you must have first completed one of the specialty trainings below.  Anesthesiologists - trained in clinical pharmacology and interventional techniques such as nerve blockade and regional as well as central neuroanatomy. They are trained to block pain before, during, and after surgical interventions.  Neurologists - trained in the diagnosis and pharmacological treatment of complex neurological conditions, such as Multiple Sclerosis, Parkinson's, spinal cord injuries, and other systemic conditions that may be associated with symptoms that may include but are not limited to pain. They tend to rely primarily on the treatment of chronic pain using prescription medications.  Psychiatrist - trained in conditions affecting the psychosocial wellbeing  of patients including but not limited to depression, anxiety, schizophrenia, personality disorders, addiction, and other substance use disorders that may be associated with chronic pain. They tend to rely primarily on the treatment of chronic pain using prescription medications.   Physical Medicine and Rehabilitation (PMR) physicians, also known as physiatrists - trained to treat a wide variety of medical conditions affecting the brain, spinal cord, nerves, bones, joints, ligaments, muscles, and tendons. Their training is primarily aimed at treating patients that have suffered injuries that have caused severe physical impairment. Their training is primarily aimed at the physical therapy and rehabilitation of those patients. They may also work alongside orthopedic surgeons or neurosurgeons using their expertise in assisting surgical patients to recover after their surgeries.  INTERVENTIONAL PAIN MANAGEMENT is sub-subspecialty of Pain Management.  Our physicians are Board-certified in Anesthesia, Pain Management, and Interventional Pain Management.  This meaning that not only have they been trained and Board-certified in their specialty of Anesthesia, and subspecialty of Pain Management, but they have also received further training in the sub-subspecialty of Interventional Pain Management, in order to become Board-certified as INTERVENTIONAL PAIN MANAGEMENT SPECIALIST.    Mission: Our goal is to use our skills in  [place] as alternatives to the chronic use of prescription opioid medications for the treatment of pain. To make this more clear, we have changed our name to reflect what we do and offer. We will continue to offer medication management assessment and recommendations, but we will not be taking over any patient's medication management.  ____________________________________________________________________________________________  Naloxone Nasal Spray What is this  medication? NALOXONE (nal OX one) treats opioid overdose, which causes slow or shallow breathing, severe drowsiness, or trouble staying awake. Call emergency services after using this medication. You may need additional treatment. Naloxone works by reversing the effects of opioids. It  belongs to a group of medications called opioid blockers. This medicine may be used for other purposes; ask your health care provider or pharmacist if you have questions. COMMON BRAND NAME(S): Kloxxado, Narcan What should I tell my care team before I take this medication? They need to know if you have any of these conditions: Heart disease Substance use disorder An unusual or allergic reaction to naloxone, other medications, foods, dyes, or preservatives Pregnant or trying to get pregnant Breast-feeding How should I use this medication? This medication is for use in the nose. Lay the person on their back. Support their neck with your hand and allow the head to tilt back before giving the medication. The nasal spray should be given into 1 nostril. After giving the medication, move the person onto their side. Do not remove or test the nasal spray until ready to use. Get emergency medical help right away after giving the first dose of this medication, even if the person wakes up. You should be familiar with how to recognize the signs and symptoms of a narcotic overdose. If more doses are needed, give the additional dose in the other nostril. Talk to your care team about the use of this medication in children. While this medication may be prescribed for children as young as newborns for selected conditions, precautions do apply. Overdosage: If you think you have taken too much of this medicine contact a poison control center or emergency room at once. NOTE: This medicine is only for you. Do not share this medicine with others. What if I miss a dose? This does not apply. What may interact with this medication? This is  only used during an emergency. No interactions are expected during emergency use. This list may not describe all possible interactions. Give your health care provider a list of all the medicines, herbs, non-prescription drugs, or dietary supplements you use. Also tell them if you smoke, drink alcohol, or use illegal drugs. Some items may interact with your medicine. What should I watch for while using this medication? Keep this medication ready for use in the case of an opioid overdose. Make sure that you have the phone number of your care team and local hospital ready. You may need to have additional doses of this medication. Each nasal spray contains a single dose. Some emergencies may require additional doses. After use, bring the treated person to the nearest hospital or call 911. Make sure the treating care team knows that the person has received a dose of this medication. You will receive additional instructions on what to do during and after use of this medication before an emergency occurs. What side effects may I notice from receiving this medication? Side effects that you should report to your care team as soon as possible: Allergic reactions--skin rash, itching, hives, swelling of the face, lips, tongue, or throat Side effects that usually do not require medical attention (report these to your care team if they continue or are bothersome): Constipation Dryness or irritation inside the nose Headache Increase in blood pressure Muscle spasms Stuffy nose Toothache This list may not describe all possible side effects. Call your doctor for medical advice about side effects. You may report side effects to FDA at 1-800-FDA-1088. Where should I keep my medication? Keep out of the reach of children and pets. Store between 20 and 25 degrees C (68 and 77 degrees F). Do not freeze. Throw away any unused medication after the expiration date. Keep in original box until ready  to use. NOTE: This sheet  is a summary. It may not cover all possible information. If you have questions about this medicine, talk to your doctor, pharmacist, or health care provider.  2023 Elsevier/Gold Standard (2020-12-14 00:00:00) _______________________________________________________________________  Medication Rules  Purpose: To inform patients, and their family members, of our medication rules and regulations.  Applies to: All patients receiving prescriptions from our practice (written or electronic).  Pharmacy of record: This is the pharmacy where your electronic prescriptions will be sent. Make sure we have the correct one.  Electronic prescriptions: In compliance with the [place] (STOP) Act of 2017 (Session Lanny Cramp [phone]), effective April 21, 2018, all controlled substances must be electronically prescribed. Written prescriptions, faxing, or calling prescriptions to a pharmacy will no longer be done.  Prescription refills: These will be provided only during in-person appointments. No medications will be renewed without a "face-to-face" evaluation with your provider. Applies to all prescriptions.  NOTE: The following applies primarily to controlled substances (Opioid* Pain Medications).   Type of encounter (visit): For patients receiving controlled substances, face-to-face visits are required. (Not an option and not up to the patient.)  Patient's responsibilities: Pain Pills: Bring all pain pills to every appointment (except for procedure appointments). Pill Bottles: Bring pills in original pharmacy bottle. Bring bottle, even if empty. Always bring the bottle of the most recent fill.  Medication refills: You are responsible for knowing and keeping track of what medications you are taking and when is it that you will need a refill. The day before your appointment: write a list of all prescriptions that need to be refilled. The day of the appointment: give the  list to the admitting nurse. Prescriptions will be written only during appointments. No prescriptions will be written on procedure days. If you forget a medication: it will not be "Called in", "Faxed", or "electronically sent". You will need to get another appointment to get these prescribed. No early refills. Do not call asking to have your prescription filled early. Partial  or short prescriptions: Occasionally your pharmacy may not have enough pills to fill your prescription.  NEVER ACCEPT a partial fill or a prescription that is short of the total amount of pills that you were prescribed.  With controlled substances the law allows 72 hours for the pharmacy to complete the prescription.  If the prescription is not completed within 72 hours, the pharmacist will require a new prescription to be written. This means that you will be short on your medicine and we WILL NOT send another prescription to complete your original prescription.  Instead, request the pharmacy to send a carrier to a nearby branch to get enough medication to provide you with your full prescription. Prescription Accuracy: You are responsible for carefully inspecting your prescriptions before leaving our office. Have the discharge nurse carefully go over each prescription with you, before taking them home. Make sure that your name is accurately spelled, that your address is correct. Check the name and dose of your medication to make sure it is accurate. Check the number of pills, and the written instructions to make sure they are clear and accurate. Make sure that you are given enough medication to last until your next medication refill appointment. Taking Medication: Take medication as prescribed. When it comes to controlled substances, taking less pills or less frequently than prescribed is permitted and encouraged. Never take more pills than instructed. Never take the medication more frequently than prescribed.  Inform other Doctors:  Always inform, all of your healthcare providers, of all the medications you take. Pain Medication from other Providers: You are not allowed to accept any additional pain medication from any other Doctor or Healthcare provider. There are two exceptions to this rule. (see below) In the event that you require additional pain medication, you are responsible for notifying us, as stated below. Cough Medicine: Often these contain an opioid, such as codeine or hydrocodone. Never accept or take cough medicine containing these opioids if you are already taking an opioid* medication. The combination may cause respiratory failure and death. Medication Agreement: You are responsible for carefully reading and following our Medication Agreement. This must be signed before receiving any prescriptions from our practice. Safely store a copy of your signed Agreement. Violations to the Agreement will result in no further prescriptions. (Additional copies of our Medication Agreement are available upon request.) Laws, Rules, & Regulations: All patients are expected to follow all Federal and [employer], Rules, [employer]. Ignorance of the Laws does not constitute a valid excuse.  Illegal drugs and Controlled Substances: The use of illegal substances (including, but not limited to marijuana and its derivatives) and/or the illegal use of any controlled substances is strictly prohibited. Violation of this rule may result in the immediate and permanent discontinuation of any and all prescriptions being written by our practice. The use of any illegal substances is prohibited. Adopted CDC guidelines & recommendations: Target dosing levels will be at or below 60 MME/day. Use of benzodiazepines** is not recommended.  Exceptions: There are only two exceptions to the rule of not receiving pain medications from other Healthcare Providers. Exception #1 (Emergencies): In the event of an emergency (i.e.: accident requiring  emergency care), you are allowed to receive additional pain medication. However, you are responsible for: As soon as you are able, call our office [phone], at any time of the day or night, and leave a message stating your name, the date and nature of the emergency, and the name and dose of the medication prescribed. In the event that your call is answered by a member of our staff, make sure to document and save the date, time, and the name of the person that took your information.  Exception #2 (Planned Surgery): In the event that you are scheduled by another doctor or dentist to have any type of surgery or procedure, you are allowed (for a period no longer than 30 days), to receive additional pain medication, for the acute post-op pain. However, in this case, you are responsible for picking up a copy of our "Post-op Pain Management for Surgeons" handout, and giving it to your surgeon or dentist. This document is available at our office, and does not require an appointment to obtain it. Simply go to our office during business hours (Monday-Thursday from 8:00 AM to 4:00 PM) (Friday 8:00 AM to 12:00 Noon) or if you have a scheduled appointment with [place], prior to your surgery, and ask for it by name. In addition, you are responsible for: calling our office [phone], at any time of the day or night, and leaving a message stating your name, name of your surgeon, type of surgery, and date of procedure or surgery. Failure to comply with your responsibilities may result in termination of therapy involving the controlled substances. Medication Agreement Violation. Following the above rules, including your responsibilities will help you in avoiding a Medication Agreement Violation ("Breaking your Pain Medication Contract").  Consequences:  Not following the above  rules may result in permanent discontinuation of medication prescription therapy.  *Opioid medications include: morphine, codeine, oxycodone,  oxymorphone, hydrocodone, hydromorphone, meperidine, tramadol, tapentadol, buprenorphine, fentanyl, methadone. **Benzodiazepine medications include: diazepam (Valium), alprazolam (Xanax), clonazepam (Klonopine), lorazepam (Ativan), clorazepate (Tranxene), chlordiazepoxide (Librium), estazolam (Prosom), oxazepam (Serax), temazepam (Restoril), triazolam (Halcion) (Last updated: 02/11/2022) ______________________________________________________________________   ______________________________________________________________________  Medication Recommendations and Reminders  Applies to: All patients receiving prescriptions (written and/or electronic).  Medication Rules & Regulations: You are responsible for reading, knowing, and following our "Medication Rules" document. These exist for your safety and that of others. They are not flexible and neither are we. Dismissing or ignoring them is an act of "non-compliance" that may result in complete and irreversible termination of such medication therapy. For safety reasons, "non-compliance" will not be tolerated. As with the [place] fundamental legal principle of "ignorance of the law is no defense", we will accept no excuses for not having read and knowing the content of documents provided to you by our practice.  Pharmacy of record:  Definition: This is the pharmacy where your electronic prescriptions will be sent.  We do not endorse any particular pharmacy. It is up to you and your insurance to decide what pharmacy to use.  We do not restrict you in your choice of pharmacy. However, once we write for your prescriptions, we will NOT be re-sending more prescriptions to fix restricted supply problems created by your pharmacy, or your insurance.  The pharmacy listed in the electronic medical record should be the one where you want electronic prescriptions to be sent. If you choose to change pharmacy, simply notify our nursing staff. Changes will be made only  during your regular appointments and not over the phone.  Recommendations: Keep all of your pain medications in a safe place, under lock and key, even if you live alone. We will NOT replace lost, stolen, or damaged medication. We do not accept "Police Reports" as proof of medications having been stolen. After you fill your prescription, take 1 week's worth of pills and put them away in a safe place. You should keep a separate, properly labeled bottle for this purpose. The remainder should be kept in the original bottle. Use this as your primary supply, until it runs out. Once it's gone, then you know that you have 1 week's worth of medicine, and it is time to come in for a prescription refill. If you do this correctly, it is unlikely that you will ever run out of medicine. To make sure that the above recommendation works, it is very important that you make sure your medication refill appointments are scheduled at least 1 week before you run out of medicine. To do this in an effective manner, make sure that you do not leave the office without scheduling your next medication management appointment. Always ask the nursing staff to show you in your prescription , when your medication will be running out. Then arrange for the receptionist to get you a return appointment, at least 7 days before you run out of medicine. Do not wait until you have 1 or 2 pills left, to come in. This is very poor planning and does not take into consideration that we may need to cancel appointments due to bad weather, sickness, or emergencies affecting our staff. DO NOT ACCEPT A "Partial Fill": If for any reason your pharmacy does not have enough pills/tablets to completely fill or refill your prescription, do not allow for a "partial fill". The law allows the pharmacy  to complete that prescription within 72 hours, without requiring a new prescription. If they do not fill the rest of your prescription within those 72 hours, you will  need a separate prescription to fill the remaining amount, which we will NOT provide. If the reason for the partial fill is your insurance, you will need to talk to the pharmacist about payment alternatives for the remaining tablets, but again, DO NOT ACCEPT A PARTIAL FILL, unless you can trust your pharmacist to obtain the remainder of the pills within 72 hours.  Prescription refills and/or changes in medication(s):  Prescription refills, and/or changes in dose or medication, will be conducted only during scheduled medication management appointments. (Applies to both, written and electronic prescriptions.) No refills on procedure days. No medication will be changed or started on procedure days. No changes, adjustments, and/or refills will be conducted on a procedure day. Doing so will interfere with the diagnostic portion of the procedure. No phone refills. No medications will be "called into the pharmacy". No Fax refills. No weekend refills. No Holliday refills. No after hours refills.  Remember:  Business hours are:  Monday to Thursday 8:00 AM to 4:00 PM Provider's Schedule: Milinda Pointer, MD - Appointments are:  Medication management: Monday and Wednesday 8:00 AM to 4:00 PM Procedure day: Tuesday and Thursday 7:30 AM to 4:00 PM Gillis Santa, MD - Appointments are:  Medication management: Tuesday and Thursday 8:00 AM to 4:00 PM Procedure day: Monday and Wednesday 7:30 AM to 4:00 PM (Last update: 02/11/2022) ______________________________________________________________________  ____________________________________________________________________________________________  Pharmacy Shortages of Pain Medication   Introduction Shockingly as it may seem, .  "No U.[place]. Supreme Court decision has ever interpreted the Constitution as guaranteeing a right to health care for all Americans." - https://huff.com/  "With respect  to human rights, the [place] has no formally codified right to health, nor does it participate in a human rights treaty that specifies a right to health." - Scott J. Schweikart, JD, MBE  Situation By now, most of our patients have had the experience of being told by their pharmacist that they do not have enough medication to cover their prescription. If you have not had this experience, just know that you soon will.  Problem There appears to be a shortage of these medications, either at the national level or locally. This is happening with all pharmacies. When there is not enough medication, patients are offered a partial fill and they are told that they will try to get the rest of the medicine for them at a later time. If they do not have enough for even a partial fill, the pharmacists are telling the patients to call us (the prescribing physicians) to request that we send another prescription to another pharmacy to get the medicine.   This reordering of a controlled substance creates documentation problems where additional paperwork needs to be created to explain why two prescriptions for the same period of time and the same medicine are being prescribed to the same patient. It also creates situations where the last appointment note does not accurately reflect when and what prescriptions were given to a patient. This leads to prescribing errors down the line, in subsequent follow-up visits.   [employer] of Pharmacy ([employer]) Research revealed that [employer]or, quantity .1806 (21 NCAC 46.1806) authorizes pharmacists to the transfer of prescriptions among pharmacies, and it sets forth procedural and recordkeeping requirements for doing so. However, this requires the pharmacist to complete the previously mentioned procedural paperwork  to accomplish the transfer. As it turns out, it is much easier for them to have the prescribing physicians do the work.   Possible solutions 1. You can ask  your physician to assist you in weaning yourself off these medications. 2. Ask your pharmacy if the medication is in stock, 3 days prior to your refill. 3. If you need a pharmacy change, let us know at your medication management visit. Prescriptions that have already been electronically sent to a pharmacy will not be re-sent to a different pharmacy if your pharmacy of record does not have it in stock. Proper stocking of medication is a pharmacy problem, not a prescriber problem. Work with your pharmacist to solve the problem. 4. Have the [facility] Assembly add a provision to the "STOP ACT" (the law that mandates how controlled substances are prescribed) where there is an exception to the electronic prescribing rule that states that in the event there are shortages of medications the physicians are allowed to use written prescriptions as opposed to electronic ones. This would allow patients to take their prescriptions to a different pharmacy that may have enough medication available to fill the prescription. The problem is that currently there is a law that does not allow for written prescriptions, with the exception of instances where the electronic medical record is down due to technical issues.  5. Have [place] Congress ease the pressure on pharmaceutical companies, allowing them to produce enough quantities of the medication to adequately supply the population. 6. Have pharmacies keep enough stocks of these medications to cover their client base.  7. Have the [facility] Assembly add a provision to the "STOP ACT" where they ease the regulations surrounding the transfer of controlled substances between pharmacies, so as to simplify the transfer of supplies. As an alternative, develop a system to allow patients to obtain the remainder of their prescription at another one of their pharmacies or at an associate pharmacy.   How this shortage will affect you.  Understand that this is a pharmacy  supply problem, not a prescriber problem. Work with your pharmacy to solve it. The job of the prescriber is to evaluate and monitor the patient for the appropriate indications and use of these medicines. It is not the job of the prescriber to supply the medication or to solve problems with that supply. The responsibility and the choice to obtain the medication resides on the patient. By law, supplying the medication is the job of the pharmacy. It is certainly not the job of the prescriber to solve supply problems.   Due to the above problems we are no longer taking patients to write for their pain medication. Future discussions with your physician may include potentially weaning medications or transitioning to alternatives.  We will be focusing primarily on interventional based pain management. We will continue to evaluate for appropriate indications and we may provide recommendations regarding medication, dose, and schedule, as well as monitoring recommendations, however, we will not be taking over the actual prescribing of these substances. On those patients where we are treating their chronic pain with interventional therapies, exceptions will be considered on a case by case basis. At this time, we will try to continue providing this supplemental service to those patients we have been managing in the past. However, as of August 1st, 2023, we no longer will be sending additional prescriptions to other pharmacies for the purpose of solving their supply problems. Once we send a prescription to a pharmacy, we  will not be resending it again to another pharmacy to cover for their shortages.   What to do. Write as many letters as you can. Recruit the help of family members in writing these letters. Below are some of the places where you can write to make your voice heard. Let them know what the problem is and push them to look for solutions.   Search internet for: "[employer] find your  legislators" NoseSwap.is  Search internet for: "[employer] commissioner complaints" Starlas.fi  Search internet for: "[place] complaints" https://www.hernandez-brewer.com/.htm  Search internet for: "[facility] complaints" Email [facility] Customer [facility] woondaal.com.jsp?callType=store  Search internet for: Programme researcher, broadcasting/film/video customer service complaints" https://www.[facility].com/topic/marketing/contactus/contactus_customerservice.jsp  ____________________________________________________________________________________________  ____________________________________________________________________________________________  Drug Holidays (Slow)  What is a "Drug Holiday"? Drug Holiday: is the name given to the period of time during which a patient stops taking a medication(s) for the purpose of eliminating tolerance to the drug.  Benefits Improved effectiveness of opioids. Decreased opioid dose needed to achieve benefits. Improved pain with lesser dose.  What is tolerance? Tolerance: is the progressive decreased in effectiveness of a drug due to its repetitive use. With repetitive use, the body gets use to the medication and as a consequence, it loses its effectiveness. This is a common problem seen with opioid pain medications. As a result, a larger dose of the drug is needed to achieve the same effect that used to be obtained with a smaller dose.  How long should a "Drug Holiday" last? You should stay off of the pain medicine for at least 14 consecutive days. (2 weeks)  Should I stop the medicine "cold [place]"? No. You should always coordinate with your Pain Specialist so that he/she can provide you with the correct medication dose to make the transition as smoothly as possible.  How do I stop the medicine? Slowly.  You will be instructed to decrease the daily amount of pills that you take by one (1) pill every seven (7) days. This is called a "slow downward taper" of your dose. For example: if you normally take four (4) pills per day, you will be asked to drop this dose to three (3) pills per day for seven (7) days, then to two (2) pills per day for seven (7) days, then to one (1) per day for seven (7) days, and at the end of those last seven (7) days, this is when the "Drug Holiday" would start.   Will I have withdrawals? By doing a "slow downward taper" like this one, it is unlikely that you will experience any significant withdrawal symptoms. Typically, what triggers withdrawals is the sudden stop of a high dose opioid therapy. Withdrawals can usually be avoided by slowly decreasing the dose over a prolonged period of time. If you do not follow these instructions and decide to stop your medication abruptly, withdrawals may be possible.  What are withdrawals? Withdrawals: refers to the wide range of symptoms that occur after stopping or dramatically reducing opiate drugs after heavy and prolonged use. Withdrawal symptoms do not occur to patients that use low dose opioids, or those who take the medication sporadically. Contrary to benzodiazepine (example: Valium, Xanax, etc.) or alcohol withdrawals ("Delirium Tremens"), opioid withdrawals are not lethal. Withdrawals are the physical manifestation of the body getting rid of the excess receptors.  Expected Symptoms Early symptoms of withdrawal may include: Agitation Anxiety Muscle aches Increased tearing Insomnia Runny nose Sweating Yawning  Late symptoms of withdrawal may include: Abdominal  cramping Diarrhea Dilated pupils Goose bumps Nausea Vomiting  Will I experience withdrawals? Due to the slow nature of the taper, it is very unlikely that you will experience any.  What is a slow taper? Taper: refers to the gradual decrease in dose.  (Last  update: 11/09/2019) ____________________________________________________________________________________________   ____________________________________________________________________________________________  CBD (cannabidiol) & Delta-8 (Delta-8 tetrahydrocannabinol) WARNING  Intro: Cannabidiol (CBD) and tetrahydrocannabinol (THC), are two natural compounds found in plants of the Cannabis genus. They can both be extracted from hemp or cannabis. Hemp and cannabis come from the Cannabis sativa plant. Both compounds interact with your body's endocannabinoid system, but they have very different effects. CBD does not produce the high sensation associated with cannabis. Delta-8 tetrahydrocannabinol, also known as delta-8 THC, is a psychoactive substance found in the Cannabis sativa plant, of which marijuana and hemp are two varieties. THC is responsible for the high associated with the illicit use of marijuana.  Applicable to: All individuals currently taking or considering taking CBD (cannabidiol) and, more important, all patients taking opioid analgesic controlled substances (pain medication). (Example: oxycodone; oxymorphone; hydrocodone; hydromorphone; morphine; methadone; tramadol; tapentadol; fentanyl; buprenorphine; butorphanol; dextromethorphan; meperidine; codeine; etc.)  Legal status: CBD remains a Schedule I drug prohibited for any use. CBD is illegal with one exception. In the [place], CBD has a limited Transport planner (FDA) approval for the treatment of two specific types of epilepsy disorders. Only one CBD product has been approved by the FDA for this purpose: "Epidiolex". FDA is aware that some companies are marketing products containing cannabis and cannabis-derived compounds in ways that violate the [employer], Drug and Cosmetic Act [facility] Act) and that may put the health and safety of consumers at risk. The FDA, a Federal agency, has not enforced the CBD status since 2018.  UPDATE: (06/07/2021) The Drug Enforcement Agency ([place]) issued a letter stating that "delta" cannabinoids, including Delta-8-THCO and Delta-9-THCO, synthetically derived from hemp do not qualify as hemp and will be viewed as Schedule I drugs. (Schedule I drugs, substances, or chemicals are defined as drugs with no currently accepted medical use and a high potential for abuse. Some examples of Schedule I drugs are: heroin, lysergic acid diethylamide (LSD), marijuana (cannabis), 3,4-methylenedioxymethamphetamine (ecstasy), methaqualone, and peyote.) (https://jennings.com/)  Legality: Some manufacturers ship CBD products nationally, which is illegal. Often such products are sold online and are therefore available throughout the country. CBD is openly sold in head shops and health food stores in some states where such sales have not been explicitly legalized. Selling unapproved products with unsubstantiated therapeutic claims is not only a violation of the law, but also can put patients at risk, as these products have not been proven to be safe or effective. Federal illegality makes it difficult to conduct research on CBD.  Reference: "FDA Regulation of Cannabis and Cannabis-Derived Products, Including Cannabidiol (CBD)" - SeekArtists.com.pt  Warning: CBD is not FDA approved and has not undergo the same manufacturing controls as prescription drugs.  This means that the purity and safety of available CBD may be questionable. Most of the time, despite manufacturer's claims, it is contaminated with THC (delta-9-tetrahydrocannabinol - the chemical in marijuana responsible for the "HIGH").  When this is the case, the [facility] contaminant will trigger a positive urine drug screen (UDS) test for Marijuana (carboxy-THC). Because a positive UDS for any illicit substance is a violation of our medication agreement, your  opioid analgesics (pain medicine) may be permanently discontinued. The FDA recently put out a warning about 5 things  that everyone should be aware of regarding Delta-8 THC: Delta-8 THC products have not been evaluated or approved by the FDA for safe use and may be marketed in ways that put the public health at risk. The FDA has received adverse event reports involving delta-8 THC-containing products. Delta-8 THC has psychoactive and intoxicating effects. Delta-8 THC manufacturing often involve use of potentially harmful chemicals to create the concentrations of delta-8 THC claimed in the marketplace. The final delta-8 THC product may have potentially harmful by-products (contaminants) due to the chemicals used in the process. Manufacturing of delta-8 THC products may occur in uncontrolled or unsanitary settings, which may lead to the presence of unsafe contaminants or other potentially harmful substances. Delta-8 THC products should be kept out of the reach of children and pets.  MORE ABOUT CBD  General Information: CBD was discovered in [birth year] and it is a derivative of the cannabis sativa genus plants (Marijuana and Hemp). It is one of the 113 identified substances found in Marijuana. It accounts for up to 40% of the plant's extract. As of 2018, preliminary clinical studies on CBD included research for the treatment of anxiety, movement disorders, and pain. CBD is available and consumed in multiple forms, including inhalation of smoke or vapor, as an aerosol spray, and by mouth. It may be supplied as an oil containing CBD, capsules, dried cannabis, or as a liquid solution. CBD is thought not to be as psychoactive as THC (delta-9-tetrahydrocannabinol - the chemical in marijuana responsible for the "HIGH"). Studies suggest that CBD may interact with different biological target receptors in the body, including cannabinoid and other neurotransmitter receptors. As of 2018 the mechanism of action for its  biological effects has not been determined.  Side-effects  Adverse reactions: Dry mouth, diarrhea, decreased appetite, fatigue, drowsiness, malaise, weakness, sleep disturbances, and others.  Drug interactions: CBC may interact with other medications such as blood-thinners. Because CBD causes drowsiness on its own, it also increases the drowsiness caused by other medications, including antihistamines (such as Benadryl), benzodiazepines (Xanax, Ativan, Valium), antipsychotics, antidepressants and opioids, as well as alcohol and supplements such as kava, melatonin and St. John's Wort. Be cautious with the following combinations:   Brivaracetam (Briviact) Brivaracetam is changed and broken down by the body. CBD might decrease how quickly the body breaks down brivaracetam. This might increase levels of brivaracetam in the body.  Caffeine Caffeine is changed and broken down by the body. CBD might decrease how quickly the body breaks down caffeine. This might increase levels of caffeine in the body.  Carbamazepine (Tegretol) Carbamazepine is changed and broken down by the body. CBD might decrease how quickly the body breaks down carbamazepine. This might increase levels of carbamazepine in the body and increase its side effects.  Citalopram (Celexa) Citalopram is changed and broken down by the body. CBD might decrease how quickly the body breaks down citalopram. This might increase levels of citalopram in the body and increase its side effects.  Clobazam (Onfi) Clobazam is changed and broken down by the liver. CBD might decrease how quickly the liver breaks down clobazam. This might increase the effects and side effects of clobazam.  Eslicarbazepine (Aptiom) Eslicarbazepine is changed and broken down by the body. CBD might decrease how quickly the body breaks down eslicarbazepine. This might increase levels of eslicarbazepine in the body by a small amount.  Everolimus (Zostress) Everolimus is  changed and broken down by the body. CBD might decrease how quickly the body breaks down everolimus. This might  increase levels of everolimus in the body.  Lithium Taking higher doses of CBD might increase levels of lithium. This can increase the risk of lithium toxicity.  Medications changed by the liver (Cytochrome P450 1A1 (CYP1A1) substrates) Some medications are changed and broken down by the liver. CBD might change how quickly the liver breaks down these medications. This could change the effects and side effects of these medications.  Medications changed by the liver (Cytochrome P450 1A2 (CYP1A2) substrates) Some medications are changed and broken down by the liver. CBD might change how quickly the liver breaks down these medications. This could change the effects and side effects of these medications.  Medications changed by the liver (Cytochrome P450 1B1 (CYP1B1) substrates) Some medications are changed and broken down by the liver. CBD might change how quickly the liver breaks down these medications. This could change the effects and side effects of these medications.  Medications changed by the liver (Cytochrome P450 2A6 (CYP2A6) substrates) Some medications are changed and broken down by the liver. CBD might change how quickly the liver breaks down these medications. This could change the effects and side effects of these medications.  Medications changed by the liver (Cytochrome P450 2B6 (CYP2B6) substrates) Some medications are changed and broken down by the liver. CBD might change how quickly the liver breaks down these medications. This could change the effects and side effects of these medications.  Medications changed by the liver (Cytochrome P450 2C19 (CYP2C19) substrates) Some medications are changed and broken down by the liver. CBD might change how quickly the liver breaks down these medications. This could change the effects and side effects of these  medications.  Medications changed by the liver (Cytochrome P450 2C8 (CYP2C8) substrates) Some medications are changed and broken down by the liver. CBD might change how quickly the liver breaks down these medications. This could change the effects and side effects of these medications.  Medications changed by the liver (Cytochrome P450 2C9 (CYP2C9) substrates) Some medications are changed and broken down by the liver. CBD might change how quickly the liver breaks down these medications. This could change the effects and side effects of these medications.  Medications changed by the liver (Cytochrome P450 2D6 (CYP2D6) substrates) Some medications are changed and broken down by the liver. CBD might change how quickly the liver breaks down these medications. This could change the effects and side effects of these medications.  Medications changed by the liver (Cytochrome P450 2E1 (CYP2E1) substrates) Some medications are changed and broken down by the liver. CBD might change how quickly the liver breaks down these medications. This could change the effects and side effects of these medications.  Medications changed by the liver (Cytochrome P450 3A4 (CYP3A4) substrates) Some medications are changed and broken down by the liver. CBD might change how quickly the liver breaks down these medications. This could change the effects and side effects of these medications.  Medications changed by the liver (Glucuronidated drugs) Some medications are changed and broken down by the liver. CBD might change how quickly the liver breaks down these medications. This could change the effects and side effects of these medications.  Medications that decrease the breakdown of other medications by the liver (Cytochrome P450 2C19 (CYP2C19) inhibitors) CBD is changed and broken down by the liver. Some drugs decrease how quickly the liver changes and breaks down CBD. This could change the effects and side effects of  CBD.  Medications that decrease the breakdown of other medications in  the liver (Cytochrome P450 3A4 (CYP3A4) inhibitors) CBD is changed and broken down by the liver. Some drugs decrease how quickly the liver changes and breaks down CBD. This could change the effects and side effects of CBD.  Medications that increase breakdown of other medications by the liver (Cytochrome P450 3A4 (CYP3A4) inducers) CBD is changed and broken down by the liver. Some drugs increase how quickly the liver changes and breaks down CBD. This could change the effects and side effects of CBD.  Medications that increase the breakdown of other medications by the liver (Cytochrome P450 2C19 (CYP2C19) inducers) CBD is changed and broken down by the liver. Some drugs increase how quickly the liver changes and breaks down CBD. This could change the effects and side effects of CBD.  Methadone (Dolophine) Methadone is broken down by the liver. CBD might decrease how quickly the liver breaks down methadone. Taking cannabidiol along with methadone might increase the effects and side effects of methadone.  Rufinamide (Banzel) Rufinamide is changed and broken down by the body. CBD might decrease how quickly the body breaks down rufinamide. This might increase levels of rufinamide in the body by a small amount.  Sedative medications (CNS depressants) CBD might cause sleepiness and slowed breathing. Some medications, called sedatives, can also cause sleepiness and slowed breathing. Taking CBD with sedative medications might cause breathing problems and/or too much sleepiness.  Sirolimus (Rapamune) Sirolimus is changed and broken down by the body. CBD might decrease how quickly the body breaks down sirolimus. This might increase levels of sirolimus in the body.  Stiripentol (Diacomit) Stiripentol is changed and broken down by the body. CBD might decrease how quickly the body breaks down stiripentol. This might increase levels of  stiripentol in the body and increase its side effects.  Tacrolimus (Prograf) Tacrolimus is changed and broken down by the body. CBD might decrease how quickly the body breaks down tacrolimus. This might increase levels of tacrolimus in the body.  Tamoxifen (Soltamox) Tamoxifen is changed and broken down by the body. CBD might affect how quickly the body breaks down tamoxifen. This might affect levels of tamoxifen in the body.  Topiramate (Topamax) Topiramate is changed and broken down by the body. CBD might decrease how quickly the body breaks down topiramate. This might increase levels of topiramate in the body by a small amount.  Valproate Valproic acid can cause liver injury. Taking cannabidiol with valproic acid might increase the chance of liver injury. CBD and/or valproic acid might need to be stopped, or the dose might need to be reduced.  Warfarin (Coumadin) CBD might increase levels of warfarin, which can increase the risk for bleeding. CBD and/or warfarin might need to be stopped, or the dose might need to be reduced.  Zonisamide Zonisamide is changed and broken down by the body. CBD might decrease how quickly the body breaks down zonisamide. This might increase levels of zonisamide in the body by a small amount. (Last update: 06/19/2021) ____________________________________________________________________________________________  ______________________________________________________________________________________________  Body mass index (BMI)  Body mass index (BMI) is a common tool for deciding whether a person has an appropriate body weight.  It measures a persons weight in relation to their height.   According to the [facility] of health (NIH): A BMI of less than 18.5 means that a person is underweight. A BMI of between 18.5 and 24.9 is ideal. A BMI of between 25 and 29.9 is overweight. A BMI over 30 indicates obesity.  Weight Management Required  URGENT:  Your weight has been found to be adversely affecting your health.  Dear [name]:  Your current Estimated body mass index is 52.31 kg/m as calculated from the following:   Height as of this encounter: 5' 3.5" (1.613 m).   Weight as of this encounter: 300 lb (136.1 kg).  Please use the table below to identify your weight category and associated incidence of chronic pain, secondary to your weight.  Body Mass Index (BMI) Classification BMI level (kg/m2) Category Associated incidence of chronic pain  <18  Underweight   18.5-24.9 Ideal body weight   25-29.9 Overweight  20%  30-34.9 Obese (Class I)  68%  35-39.9 Severe obesity (Class II)  136%  >40 Extreme obesity (Class III)  254%   In addition: You will be considered "Morbidly Obese", if your BMI is above 30 and you have one or more of the following conditions which are known to be caused and/or directly associated with obesity: 1.    Type 2 Diabetes (Which in turn can lead to cardiovascular diseases (CVD), stroke, peripheral vascular diseases (PVD), retinopathy, nephropathy, and neuropathy) 2.    Cardiovascular Disease (High Blood Pressure; Congestive Heart Failure; High Cholesterol; Coronary Artery Disease; Angina; or History of Heart Attacks) 3.    Breathing problems (Asthma; obesity-hypoventilation syndrome; obstructive sleep apnea; chronic inflammatory airway disease; reactive airway disease; or shortness of breath) 4.    Chronic kidney disease 5.    Liver disease (nonalcoholic fatty liver disease) 6.    High blood pressure 7.    Acid reflux (gastroesophageal reflux disease; heartburn) 8.    Osteoarthritis (OA) (with any of the following: hip pain; knee pain; and/or low back pain) 9.    Low back pain (Lumbar Facet Syndrome; and/or Degenerative Disc Disease) 10.  Hip pain (Osteoarthritis of hip) (For every 1 lbs of added body weight, there is a 2 lbs increase in pressure inside of each hip articulation. 1:2 mechanical  relationship) 11.  Knee pain (Osteoarthritis of knee) (For every 1 lbs of added body weight, there is a 4 lbs increase in pressure inside of each knee articulation. 1:4 mechanical relationship) (patients with a BMI>30 kg/m2 were 6.8 times more likely to develop knee OA than normal-weight individuals) 12.  Cancer: Epidemiological studies have shown that obesity is a risk factor for: post-menopausal breast cancer; cancers of the endometrium, colon and kidney cancer; malignant adenomas of the oesophagus. Obese subjects have an approximately 1.5-3.5-fold increased risk of developing these cancers compared with normal-weight subjects, and it has been estimated that between 15 and 45% of these cancers can be attributed to overweight. More recent studies suggest that obesity may also increase the risk of other types of cancer, including pancreatic, hepatic and gallbladder cancer. (Ref: Obesity and cancer. Pischon T, Nthlings U, [employer] H. Proc Nutr Soc. 2008 May;67(2):128-45. doi: 74.0814/G8185631497026378.) The International Agency for Research on Cancer (IARC) has identified 13 cancers associated with overweight and obesity: meningioma, multiple myeloma, adenocarcinoma of the esophagus, and cancers of the thyroid, postmenopausal breast cancer, gallbladder, stomach, liver, pancreas, kidney, ovaries, uterus, colon and rectal (colorectal) cancers. 77 percent of all cancers diagnosed in women and 24 percent of those diagnosed in men are associated with overweight and obesity.  Recommendation: At this point it is urgent that you take a step back and concentrate in loosing weight. Dedicate 100% of your efforts on this task. Nothing else will improve your health more than bringing your weight down and your BMI to less than 30. If you are  here, you probably have chronic pain. We know that most chronic pain patients have difficulty exercising secondary to their pain. For this reason, you must rely on proper nutrition and diet  in order to lose the weight. If your BMI is above 40, you should seriously consider bariatric surgery. A realistic goal is to lose 10% of your body weight over a period of 12 months.  Be honest to yourself, if over time you have unsuccessfully tried to lose weight, then it is time for you to seek professional help and to enter a medically supervised weight management program, and/or undergo bariatric surgery. Stop procrastinating.   Pain management considerations:  1.    Pharmacological Problems: Be advised that the use of opioid analgesics (oxycodone; hydrocodone; morphine; methadone; codeine; and all of their derivatives) have been associated with decreased metabolism and weight gain.  For this reason, should we see that you are unable to lose weight while taking these medications, it may become necessary for [place] to taper down and indefinitely discontinue them.  2.    Technical Problems: The incidence of successful interventional therapies decreases as the patient's BMI increases. It is much more difficult to accomplish a safe and effective interventional therapy on a patient with a BMI above 35. 3.    Radiation Exposure Problems: The x-rays machine, used to accomplish injection therapies, will automatically increase their x-ray output in order to capture an appropriate bone image. This means that radiation exposure increases exponentially with the patient's BMI. (The higher the BMI, the higher the radiation exposure.) Although the level of radiation used at a given time is still safe to the patient, it is not for the physician and/or assisting staff. Unfortunately, radiation exposure is accumulative. Because physicians and the staff have to do procedures and be exposed on a daily basis, this can result in health problems such as cancer and radiation burns. Radiation exposure to the staff is monitored by the radiation batches that they wear. The exposure levels are reported back to the staff on a quarterly  basis. Depending on levels of exposure, physicians and staff may be obligated by law to decrease this exposure. This means that they have the right and obligation to refuse providing therapies where they may be overexposed to radiation. For this reason, physicians may decline to offer therapies such as radiofrequency ablation or implants to patients with a BMI above 40. 4.    Current Trends: Be advised that the current trend is to no longer offer certain therapies to patients with a BMI equal to, or above 35, due to increase perioperative risks, increased technical procedural difficulties, and excessive radiation exposure to healthcare personnel.  ______________________________________________________________________________________________   For a more information on how diet can influence inflammation, please visit: [email or web address] NOTE: We are not associated or profit from this web site.

## 2022-02-11 NOTE — Progress Notes (Unsigned)
PROVIDER NOTE: Information contained herein reflects review and annotations entered in association with encounter. Interpretation of such information and data should be left to medically-trained personnel. Information provided to patient can be located elsewhere in the medical record under "Patient Instructions". Document created using STT-dictation technology, any transcriptional errors that may result from process are unintentional.    Patient: Alison Cox  Service Category: E/M  Provider: Gaspar Cola, MD  DOB: 11/28/1955  DOS: 02/12/2022  Referring Provider: Lajean Manes, MD  MRN: [id number]  Specialty: Interventional Pain Management  PCP: Lajean Manes, MD  Type: Established Patient  Setting: Ambulatory outpatient    Location: Office  Delivery: Face-to-face     HPI  Alison Cox, a 66 y.o. year old female, is here today because of her Chronic pain syndrome [G89.4]. Alison Cox primary complain today is No chief complaint on file. Last encounter: My last encounter with her was on Visit date not found. 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 Pain Assessment: Severity of   is reported as a  /10. Location:    / . Onset:  . Quality:  . Timing:  . Modifying factor(s):  [name] Kitchen Vitals:  vitals were not taken for this visit.   Reason for encounter: medication management. ***  Routine UDS ordered today.   RTCB: 05/12/2022  Pharmacotherapy  Assessment  Analgesic: Hydrocodone/APAP 10/325 one tablet by mouth every 6 hours (total: 40 mg/day) MME/day: 40 mg/day.   Monitoring: [place] PMP: PDMP reviewed during this encounter.       Pharmacotherapy: No side-effects or adverse reactions reported. Compliance: No problems identified. Effectiveness: Clinically acceptable.  No notes on file  No results found for: "CBDTHCR" No results found for: "D8THCCBX" No results found for: "D9THCCBX"  UDS:  Summary  Date Value Ref Range Status  02/12/2021 Note  Final    Comment:    ==================================================================== [facility] 13 (MW) ==================================================================== Test                             Result       Flag       Units  Drug Present and Declared for Prescription Verification   Hydrocodone                    3980         EXPECTED   ng/mg creat   Hydromorphone                  554          EXPECTED   ng/mg creat   Dihydrocodeine                 681          EXPECTED   ng/mg creat   Norhydrocodone                 2197         EXPECTED   ng/mg creat    Sources of hydrocodone include scheduled  prescription medications.    Hydromorphone, dihydrocodeine and norhydrocodone are expected    metabolites of hydrocodone. Hydromorphone and dihydrocodeine are    also available as scheduled prescription medications.  ==================================================================== Test                      Result    Flag   Units      Ref Range   Creatinine              226              mg/dL      >=20 ==================================================================== Declared Medications:  The flagging and interpretation on this report are based on the  following declared medications.  Unexpected results may arise from  inaccuracies in the declared medications.   **Note: The testing scope of this panel includes these medications:   Hydrocodone (Norco)    **Note: The testing scope of this panel does not include the  following reported medications:   Acetaminophen (Norco)  Amlodipine (Norvasc)  Calcium  Cholecalciferol  Ciclopirox  Crisaborole  Doxycycline  Eye Drop  Fluocinolone  Furosemide (Lasix)  Ivermectin  Ketoconazole  Levothyroxine (Synthroid)  Magnesium (Mag-Ox)  Meloxicam (Mobic)  Nystatin  Spironolactone (Aldactone)  Supplement (Fiber)  Triamcinolone ==================================================================== For clinical consultation, please call [phone]. ====================================================================       ROS  Constitutional: Denies any fever or chills Gastrointestinal: No reported hemesis, hematochezia, vomiting, or acute GI distress Musculoskeletal: Denies any acute onset joint swelling, redness, loss of ROM, or weakness Neurological: No reported episodes of acute onset apraxia, aphasia, dysarthria, agnosia, amnesia, paralysis, loss of coordination, or loss of consciousness  Medication Review  Cholecalciferol, Ciclopirox, Crisaborole, Difluprednate, Fluocinolone Acetonide, Fluocinolone Acetonide Body, HYDROcodone-acetaminophen, Ivermectin, Magnesium Oxide -Mg Supplement, albuterol, amLODipine, calcium carbonate, doxycycline, furosemide, ketoconazole, levothyroxine, losartan, meloxicam, nystatin-triamcinolone, and spironolactone  History Review  Allergy: Alison Cox is allergic to penicillins, shellfish allergy, and sulfa antibiotics. Drug: Alison Cox  reports no history of drug use. Alcohol:  reports no history of alcohol use. Tobacco:  reports that she has never smoked. She has never used smokeless tobacco. Social: Alison Cox  reports that she has never smoked. She has never used smokeless tobacco. She reports that she does not drink alcohol and does not use drugs. 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 Surgical: Alison Cox  has a past surgical history that includes Endometrial ablation; DILATATION AND CURETTAGE; Corneal transplant; and Corneal transplant. Family: family history includes Hypertension in her father and mother; Thyroid disease in her father.  Laboratory Chemistry Profile   Renal Lab Results  Component Value Date   BUN 14 01/17/2019   CREATININE 1.00 01/17/2019   BCR 14 01/17/2019   GFRAA 70 01/17/2019   GFRNONAA 61 01/17/2019    Hepatic Lab Results  Component Value Date   AST 32 01/17/2019   ALT 47 05/01/2016   ALBUMIN 4.8 01/17/2019   ALKPHOS 112 01/17/2019    Electrolytes Lab Results  Component Value Date   NA 141 01/17/2019   K 4.2 01/17/2019   CL 100 01/17/2019   CALCIUM 10.5 (H) 01/17/2019   MG 2.1 01/17/2019    Bone Lab Results  Component Value Date   25OHVITD1 36 01/17/2019   25OHVITD2 <1.0 01/17/2019   25OHVITD3 35 01/17/2019  Inflammation (CRP: Acute Phase) (ESR: Chronic Phase) Lab Results  Component Value Date   CRP 11 (H) 01/17/2019   ESRSEDRATE 39 01/17/2019         Note: Above Lab results reviewed.  Recent Imaging Review  DG Chest 2 View CLINICAL DATA:  Shortness of breath for several weeks  EXAM: CHEST - 2 VIEW  COMPARISON:  9/2/9  FINDINGS: The heart size and mediastinal contours are within normal limits. Both lungs are clear. The visualized skeletal structures are unremarkable.  IMPRESSION: No active cardiopulmonary disease.  Electronically Signed   By: Inez Catalina M.D.   On: 09/16/2019 08:40 Note: Reviewed        Physical Exam  General appearance: Well nourished, well developed, and well hydrated. In no apparent acute distress Mental status: Alert, oriented x 3 (person, place, & time)       Respiratory: No  evidence of acute respiratory distress Eyes: PERLA Vitals: There were no vitals taken for this visit. BMI: Estimated body mass index is 53.14 kg/m as calculated from the following:   Height as of 10/31/21: '5\' 3"'  (1.6 m).   Weight as of 10/31/21: 300 lb (136.1 kg). Ideal: Patient weight not recorded  Assessment   Diagnosis Status  1. Chronic pain syndrome   2. Chronic knee pain (1ry area of Pain) (Bilateral) (R>L)   3. Chronic ankle pain (2ry area of Pain) (Bilateral) (L>R)   4. Chronic low back pain (3ry area of Pain) (midline)   5. Grade 1 Anterolisthesis of L4 over L5   6. Lumbar spondylosis   7. Pharmacologic therapy   8. Chronic use of opiate for therapeutic purpose   9. Encounter for medication management   10. 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  Alison Cox has a current medication list which includes the following long-term medication(s): albuterol, amlodipine, calcium carbonate, cholecalciferol, furosemide, hydrocodone-acetaminophen, levothyroxine, losartan, magnesium oxide -mg supplement, meloxicam, and spironolactone.  Pharmacotherapy (Medications Ordered): No orders of the defined types were placed in this encounter.  Orders:  No orders of the defined types were placed in this encounter.  Follow-up plan:   No follow-ups on file.      Interventional Therapies  Risk  Complexity Considerations:   Estimated body mass index is 53.14 kg/m as calculated from the following:   Height as of 10/31/21: '5\' 3"'  (1.6 m).   Weight as of 10/31/21:  300 lb (136.1 kg). WNL   Planned  Pending:      Under consideration:   None at this time. The patient's body habitus makes any interventional therapy technically challenging and decreases the chances of success. The patient has been informed that once her BMI goes down low 35, we will revisit and offer her other alternatives.   Completed:   None since joining the practice on 07/26/2018    Completed by other providers:   None at this time   Therapeutic  Palliative (PRN) options:   None established    Recent Visits No visits were found meeting these conditions. Showing recent visits within past 90 days and  meeting all other requirements Future Appointments Date Type Provider Dept  02/12/22 Appointment Milinda Pointer, MD [facility]  Showing future appointments within next 90 days and meeting all other requirements  I discussed the assessment and treatment plan with the patient. The patient was provided an opportunity to ask questions and all were answered. The patient agreed with the plan and demonstrated an understanding of the instructions.  Patient advised to call back or seek an in-person evaluation if the symptoms or condition worsens.  Duration of encounter: *** minutes.  Total time on encounter, as per AMA guidelines included both the face-to-face and non-face-to-face time personally spent by the physician and/or other qualified health care professional(s) on the day of the encounter (includes time in activities that require the physician or other qualified health care professional and does not include time in activities normally performed by clinical staff). Physician's time may include the following activities when performed: preparing to see the patient (eg, review of tests, pre-charting review of records) obtaining and/or reviewing separately obtained history performing a medically appropriate examination and/or evaluation counseling and educating the  patient/family/caregiver ordering medications, tests, or procedures referring and communicating with other health care professionals (when not separately reported) documenting clinical information in the electronic or other health record independently interpreting results (not separately reported) and communicating results to the patient/ family/caregiver care coordination (not separately reported)  Note by: Gaspar Cola, MD Date: 02/12/2022; Time: 1:35 PM

## 2022-02-12 ENCOUNTER — Encounter: Payer: Self-pay | Admitting: Pain Medicine

## 2022-02-12 ENCOUNTER — Ambulatory Visit: Payer: Medicare HMO | Attending: Pain Medicine | Admitting: Pain Medicine

## 2022-02-12 VITALS — BP 147/75 | HR 98 | Temp 97.9°F | Resp 18 | Ht 63.5 in | Wt 300.0 lb

## 2022-02-12 DIAGNOSIS — M4310 Spondylolisthesis, site unspecified: Secondary | ICD-10-CM | POA: Diagnosis not present

## 2022-02-12 DIAGNOSIS — G894 Chronic pain syndrome: Secondary | ICD-10-CM | POA: Insufficient documentation

## 2022-02-12 DIAGNOSIS — M545 Low back pain, unspecified: Secondary | ICD-10-CM | POA: Insufficient documentation

## 2022-02-12 DIAGNOSIS — E559 Vitamin D deficiency, unspecified: Secondary | ICD-10-CM | POA: Diagnosis not present

## 2022-02-12 DIAGNOSIS — M25562 Pain in left knee: Secondary | ICD-10-CM | POA: Insufficient documentation

## 2022-02-12 DIAGNOSIS — M25552 Pain in left hip: Secondary | ICD-10-CM | POA: Diagnosis not present

## 2022-02-12 DIAGNOSIS — M25571 Pain in right ankle and joints of right foot: Secondary | ICD-10-CM | POA: Insufficient documentation

## 2022-02-12 DIAGNOSIS — M25561 Pain in right knee: Secondary | ICD-10-CM | POA: Insufficient documentation

## 2022-02-12 DIAGNOSIS — M25572 Pain in left ankle and joints of left foot: Secondary | ICD-10-CM | POA: Insufficient documentation

## 2022-02-12 DIAGNOSIS — Z79899 Other long term (current) drug therapy: Secondary | ICD-10-CM | POA: Insufficient documentation

## 2022-02-12 DIAGNOSIS — Z789 Other specified health status: Secondary | ICD-10-CM | POA: Diagnosis not present

## 2022-02-12 DIAGNOSIS — Z5189 Encounter for other specified aftercare: Secondary | ICD-10-CM | POA: Diagnosis present

## 2022-02-12 DIAGNOSIS — Z79891 Long term (current) use of opiate analgesic: Secondary | ICD-10-CM | POA: Diagnosis not present

## 2022-02-12 DIAGNOSIS — M47816 Spondylosis without myelopathy or radiculopathy, lumbar region: Secondary | ICD-10-CM | POA: Insufficient documentation

## 2022-02-12 DIAGNOSIS — G8929 Other chronic pain: Secondary | ICD-10-CM | POA: Diagnosis not present

## 2022-02-12 DIAGNOSIS — M899 Disorder of bone, unspecified: Secondary | ICD-10-CM | POA: Diagnosis not present

## 2022-02-12 MED ORDER — HYDROCODONE-ACETAMINOPHEN 10-325 MG PO TABS: 1.0000 | ORAL_TABLET | Freq: Four times a day (QID) | ORAL | 0 refills | Status: DC | PRN

## 2022-02-12 MED ORDER — NALOXONE HCL 4 MG/0.1ML NA LIQD: 1.0000 | NASAL | 0 refills | Status: AC | PRN

## 2022-02-12 NOTE — Progress Notes (Signed)
Safety precautions to be maintained throughout the outpatient stay will include: orient to surroundings, keep bed in low position, maintain call bell within reach at all times, provide assistance with transfer out of bed and ambulation.     Nursing Pain Medication Assessment:  Safety precautions to be maintained throughout the outpatient stay will include: orient to surroundings, keep bed in low position, maintain call bell within reach at all times, provide assistance with transfer out of bed and ambulation.  Medication Inspection Compliance: Pill count conducted under aseptic conditions, in front of the patient. Neither the pills nor the bottle was removed from the patient's sight at any time. Once count was completed pills were immediately returned to the patient in their original bottle.  Medication: Hydrocodone/APAP Pill/Patch Count:  27.5 of 120 pills remain Pill/Patch Appearance: Markings consistent with prescribed medication Bottle Appearance: Standard pharmacy container. Clearly labeled. Filled Date: 09 / 27 / 2023 Last Medication intake:  Today

## 2022-02-17 ENCOUNTER — Telehealth: Payer: Self-pay | Admitting: Pain Medicine

## 2022-02-17 NOTE — Telephone Encounter (Signed)
Patient states pharmacy sent some papers over to fill out before she can get her medications filled. (Prior authorization maybe) please notify patient when she can pick up.   Also she says could Dr. Dossie Arbour put in xrays for both hands, they hurt her a lot. She is coming in tomorrow to get other xrays.  [phone]

## 2022-02-17 NOTE — Telephone Encounter (Signed)
Called [facility], verified that Hydrocodone has been approved by insurance. Patient notified. Also, Dr. Lowella Dandy will not order x-rays of hands. Patient notified of this also.

## 2022-02-18 LAB — TOXASSURE SELECT 13 (MW), URINE

## 2022-02-19 ENCOUNTER — Ambulatory Visit
Admission: RE | Admit: 2022-02-19 | Discharge: 2022-02-19 | Disposition: A | Discharge status: Home / Self Care | Payer: Medicare HMO | Attending: Pain Medicine | Admitting: Pain Medicine

## 2022-02-19 ENCOUNTER — Ambulatory Visit
Admission: RE | Admit: 2022-02-19 | Discharge: 2022-02-19 | Disposition: A | Discharge status: Home / Self Care | Payer: Medicare HMO | Source: Ambulatory Visit | Attending: Pain Medicine | Admitting: Pain Medicine

## 2022-02-19 DIAGNOSIS — M1612 Unilateral primary osteoarthritis, left hip: Secondary | ICD-10-CM | POA: Diagnosis not present

## 2022-02-19 DIAGNOSIS — M25561 Pain in right knee: Secondary | ICD-10-CM | POA: Diagnosis not present

## 2022-02-19 DIAGNOSIS — M25572 Pain in left ankle and joints of left foot: Secondary | ICD-10-CM | POA: Insufficient documentation

## 2022-02-19 DIAGNOSIS — M4310 Spondylolisthesis, site unspecified: Secondary | ICD-10-CM

## 2022-02-19 DIAGNOSIS — M4316 Spondylolisthesis, lumbar region: Secondary | ICD-10-CM | POA: Diagnosis not present

## 2022-02-19 DIAGNOSIS — M25552 Pain in left hip: Secondary | ICD-10-CM | POA: Insufficient documentation

## 2022-02-19 DIAGNOSIS — G8929 Other chronic pain: Secondary | ICD-10-CM | POA: Diagnosis not present

## 2022-02-19 DIAGNOSIS — M170 Bilateral primary osteoarthritis of knee: Secondary | ICD-10-CM | POA: Diagnosis not present

## 2022-02-19 DIAGNOSIS — M25562 Pain in left knee: Secondary | ICD-10-CM | POA: Diagnosis not present

## 2022-02-19 DIAGNOSIS — M25571 Pain in right ankle and joints of right foot: Secondary | ICD-10-CM | POA: Insufficient documentation

## 2022-02-19 DIAGNOSIS — M545 Low back pain, unspecified: Secondary | ICD-10-CM | POA: Diagnosis not present

## 2022-02-19 DIAGNOSIS — M47816 Spondylosis without myelopathy or radiculopathy, lumbar region: Secondary | ICD-10-CM | POA: Insufficient documentation

## 2022-02-19 DIAGNOSIS — M7731 Calcaneal spur, right foot: Secondary | ICD-10-CM | POA: Diagnosis not present

## 2022-02-19 DIAGNOSIS — M7732 Calcaneal spur, left foot: Secondary | ICD-10-CM | POA: Diagnosis not present

## 2022-02-19 LAB — COMP. METABOLIC PANEL (12)
AST: 27 IU/L (ref 0–40)
Albumin/Globulin Ratio: 1.8 (ref 1.2–2.2)
Albumin: 4.8 g/dL (ref 3.9–4.9)
Alkaline Phosphatase: 114 IU/L (ref 44–121)
BUN/Creatinine Ratio: 15 (ref 12–28)
BUN: 15 mg/dL (ref 8–27)
Bilirubin Total: 0.9 mg/dL (ref 0.0–1.2)
Calcium: 10.2 mg/dL (ref 8.7–10.3)
Chloride: 103 mmol/L (ref 96–106)
Creatinine, Ser: 1 mg/dL (ref 0.57–1.00)
Globulin, Total: 2.7 g/dL (ref 1.5–4.5)
Glucose: 107 mg/dL — ABNORMAL HIGH (ref 70–99)
Potassium: 4 mmol/L (ref 3.5–5.2)
Sodium: 141 mmol/L (ref 134–144)
Total Protein: 7.5 g/dL (ref 6.0–8.5)
eGFR: 63 mL/min/{1.73_m2} (ref >59)

## 2022-02-19 LAB — 25-HYDROXY VITAMIN D LCMS D2+D3
25-Hydroxy, Vitamin D-2: <1 ng/mL
25-Hydroxy, Vitamin D-3: 30 ng/mL
25-Hydroxy, Vitamin D: 30 ng/mL

## 2022-02-19 LAB — MAGNESIUM: Magnesium: 1.9 mg/dL (ref 1.6–2.3)

## 2022-02-19 LAB — SEDIMENTATION RATE: Sed Rate: 22 mm/hr (ref 0–40)

## 2022-02-19 LAB — C-REACTIVE PROTEIN: CRP: 8 mg/L (ref 0–10)

## 2022-02-19 LAB — VITAMIN B12: Vitamin B-12: 471 pg/mL (ref 232–1245)

## 2022-02-20 DIAGNOSIS — R195 Other fecal abnormalities: Secondary | ICD-10-CM | POA: Diagnosis not present

## 2022-02-21 DIAGNOSIS — R195 Other fecal abnormalities: Secondary | ICD-10-CM | POA: Diagnosis not present

## 2022-03-03 ENCOUNTER — Other Ambulatory Visit: Payer: Medicare Other

## 2022-03-10 NOTE — Progress Notes (Unsigned)
PROVIDER NOTE: Information contained herein reflects review and annotations entered in association with encounter. Interpretation of such information and data should be left to medically-trained personnel. Information provided to patient can be located elsewhere in the medical record under "Patient Instructions". Document created using STT-dictation technology, any transcriptional errors that may result from process are unintentional.    Patient: Alison Cox  Service Category: E/M  Provider: Gaspar Cola, MD  DOB: 10-Aug-1955  DOS: 03/12/2022  Referring Provider: Lajean Manes, MD  MRN: [id number]  Specialty: Interventional Pain Management  PCP: Lajean Manes, MD  Type: Established Patient  Setting: Ambulatory outpatient    Location: Office  Delivery: Face-to-face     HPI  Alison Cox, a 66 y.o. year old female, is here today because of her No primary diagnosis found.. [name] primary complain today is No chief complaint on file. Last encounter: My last encounter with her was on 02/17/2022. 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 Pain Assessment: Severity of   is reported as a  /10. Location:    / . Onset:  . Quality:  . Timing:  . Modifying factor(s):  [name] Kitchen Vitals:  vitals were not taken for this visit.   Reason for encounter:  *** . ***  Pharmacotherapy Assessment  Analgesic: Hydrocodone/APAP 10/325 one tablet by mouth every 6 hours  (total: 40 mg/day) MME/day: 40 mg/day.   Monitoring: [place] PMP: PDMP reviewed during this encounter.       Pharmacotherapy: No side-effects or adverse reactions reported. Compliance: No problems identified. Effectiveness: Clinically acceptable.  No notes on file  No results found for: "CBDTHCR" No results found for: "D8THCCBX" No results found for: "D9THCCBX"  UDS:  Summary  Date Value Ref Range Status  02/12/2022 Note  Final    Comment:    ==================================================================== [facility] (MW) ==================================================================== Test                             Result       Flag       Units  Drug Present and Declared for Prescription Verification   Hydrocodone                    >2551        EXPECTED   ng/mg creat   Hydromorphone                  45           EXPECTED   ng/mg creat   Dihydrocodeine                 736          EXPECTED   ng/mg creat   Norhydrocodone                 >1276        EXPECTED   ng/mg creat    Sources of hydrocodone include scheduled prescription medications.    Hydromorphone, dihydrocodeine and norhydrocodone are expected  metabolites of hydrocodone. Hydromorphone and dihydrocodeine are    also available as scheduled prescription medications.  ==================================================================== Test                      Result    Flag   Units      Ref Range   Creatinine              392              mg/dL      >=20 ==================================================================== Declared Medications:  The flagging and interpretation on this report are based on the  following declared medications.  Unexpected results may arise from  inaccuracies in the declared medications.   **Note: The testing scope of this panel includes these medications:   Hydrocodone (Norco)   **Note: The testing scope of this panel does not include the  following reported  medications:   Acetaminophen (Norco)  Albuterol  Amlodipine  Calcium  Cholecalciferol  Ciclopirox  Doxycycline  Eye Drop  Fluocinolone  Furosemide (Lasix)  Ketoconazole (Nizoral)  Levothyroxine  Losartan  Magnesium (Mag-Ox)  Meloxicam  Naloxone (Narcan)  Nystatin  Spironolactone  Topical  Triamcinolone ==================================================================== For clinical consultation, please call [phone]. ====================================================================       ROS  Constitutional: Denies any fever or chills Gastrointestinal: No reported hemesis, hematochezia, vomiting, or acute GI distress Musculoskeletal: Denies any acute onset joint swelling, redness, loss of ROM, or weakness Neurological: No reported episodes of acute onset apraxia, aphasia, dysarthria, agnosia, amnesia, paralysis, loss of coordination, or loss of consciousness  Medication Review  Cholecalciferol, Ciclopirox, Crisaborole, Difluprednate, Fluocinolone Acetonide, Fluocinolone Acetonide Body, HYDROcodone-acetaminophen, Ivermectin, Magnesium Oxide -Mg Supplement, albuterol, amLODipine, calcium carbonate, doxycycline, furosemide, ketoconazole, levothyroxine, losartan, meloxicam, naloxone, nystatin-triamcinolone, and spironolactone  History Review  Allergy: Alison Cox is allergic to penicillins, shellfish allergy, and sulfa antibiotics. Drug: Alison Cox  reports no history of drug use. Alcohol:  reports no history of alcohol use. Tobacco:  reports that she has never smoked. She has never used smokeless tobacco. Social: Alison Cox  reports that she has never smoked. She has never used smokeless tobacco. She reports that she does not drink alcohol and does not use drugs. Medical:  has a past medical history of History of Bell's palsy (03/06/2015), History of Hashimoto thyroiditis (03/06/2015), History of nephrolithiasis (03/06/2015), Hypertension, Kidney stone, Macular corneal  dystrophy (04/22/2003), Osteoarthritis, Squamous cell carcinoma of skin (10/30/2016), and Thyroid disease. Surgical: Alison Cox  has a past surgical history that includes Endometrial ablation; DILATATION AND CURETTAGE; Corneal transplant; and Corneal transplant. Family: family history includes Hypertension in her father and mother; Thyroid disease in her father.  Laboratory Chemistry Profile   Renal Lab Results  Component Value Date   BUN 15 02/12/2022   CREATININE 1.00 02/12/2022   BCR 15 02/12/2022   GFRAA 70 01/17/2019   GFRNONAA 61 01/17/2019    Hepatic Lab Results  Component Value Date   AST 27 02/12/2022   ALT 47 05/01/2016   ALBUMIN 4.8 02/12/2022   ALKPHOS 114 02/12/2022    Electrolytes Lab Results  Component Value Date   NA 141 02/12/2022   K 4.0 02/12/2022   CL 103 02/12/2022   CALCIUM 10.2 02/12/2022   MG 1.9 02/12/2022    Bone Lab Results  Component Value Date   25OHVITD1 30 02/12/2022   25OHVITD2 <1.0 02/12/2022   25OHVITD3 30 02/12/2022    Inflammation (CRP: Acute Phase) (ESR: Chronic Phase) Lab Results  Component Value Date  CRP 8 02/12/2022   ESRSEDRATE 22 02/12/2022         Note: Above Lab results reviewed.  Recent Imaging Review  DG HIP UNILAT W OR W/O PELVIS 2-3 VIEWS LEFT CLINICAL DATA:  Left hip pain/arthralgia. Chronic left hip pain.  EXAM: DG HIP (WITH OR WITHOUT PELVIS) 2-3V LEFT  COMPARISON:  None Available.  FINDINGS: There is bilateral hip joint space narrowing, left greater than right. Mild bilateral acetabular spurring, left greater than right. The femoral head is seated in the acetabulum. No evidence of fracture. Detailed joint space assessment is limited due to soft tissue attenuation from habitus, allowing for this no evidence of erosion or avascular necrosis. No evidence of bony destructive change or focal lesion. There is degenerative change of both sacroiliac joints. No sacroiliac joint or pubic symphyseal  widening.  IMPRESSION: 1. Bilateral hip osteoarthritis, left greater than right. 2. Degenerative change of the sacroiliac joints.  Electronically Signed   By: Keith Rake M.D.   On: 02/20/2022 12:02 DG Lumbar Spine Complete W/Bend CLINICAL DATA:  Lumbar spondylosis. Chronic bilateral low back pain without sciatica. Anterolisthesis.  EXAM: LUMBAR SPINE - COMPLETE WITH BENDING VIEWS  COMPARISON:  Lumbar radiograph 10/11/2011  FINDINGS: Soft tissue attenuation from habitus limits detailed assessment. There is subjective bony under mineralization. Five non-rib-bearing lumbar vertebra. There is trace anterolisthesis of L4 on L5 and trace retrolisthesis of L2 on L3 that is unchanged on flexion and extension, however there is limited range of motion. Disc space narrowing with spurring at L2-L3, L3-L4, L4-L5 and L5-S1, progression from 2013 exam. Moderate facet hypertrophy from L2-L3 through the lumbosacral junction, also progressed. Normal vertebral body heights. No evidence of fracture compression deformity. No obvious bony destructive change or focal lesion.  IMPRESSION: 1. Multilevel degenerative disc disease and facet hypertrophy of the lumbar spine, progression from 2013 exam. 2. Trace anterolisthesis of L4 on L5 and trace retrolisthesis of L2 on L3, unchanged on flexion and extension.  Electronically Signed   By: Keith Rake M.D.   On: 02/20/2022 12:01 DG Ankle Complete Left CLINICAL DATA:  Left ankle pain. Chronic pain of both ankles.  EXAM: LEFT ANKLE COMPLETE - 3+ VIEW  COMPARISON:  None Available.  FINDINGS: Normal alignment. The ankle mortise is preserved. Cystic change in the medial talar dome. There is tibial talar spurring. Chronic corticated densities adjacent to the medial malleolus. Fragmented plantar calcaneal spur and small Achilles tendon enthesophyte. No acute fracture. No erosive change or bony destruction. No ankle joint  effusion.  IMPRESSION: 1. Mild osteoarthritis of the ankle joint. Cystic change in the medial talar dome may be degenerative or secondary to osteochondral lesion. 2. Fragmented plantar calcaneal spur and small Achilles tendon enthesophyte.  Electronically Signed   By: Keith Rake M.D.   On: 02/20/2022 11:59 DG Ankle Complete Right CLINICAL DATA:  Chronic pain of both ankles. Right ankle pain.  EXAM: RIGHT ANKLE - COMPLETE 3+ VIEW  COMPARISON:  None Available.  FINDINGS: Normal alignment. Ankle mortise is preserved. Cystic change in the medial talar dome may be related to degenerative change or osteochondral defect. There is tibial talar spurring. No acute or evidence of prior fracture. Chronic corticated densities adjacent to the medial malleolus. There is a small plantar calcaneal spur and Achilles tendon enthesophyte. No significant ankle joint effusion. No erosive change or bony destruction.  IMPRESSION: 1. Tibiotalar osteoarthritis. Cystic change in the medial talar dome may be related to degenerative change or osteochondral defect. 2. Small plantar calcaneal  spur and Achilles tendon enthesophyte.  Electronically Signed   By: Keith Rake M.D.   On: 02/20/2022 11:57 DG Knee Complete 4 Views Left CLINICAL DATA:  Left knee pain/arthralgia. Chronic bilateral knee pain.  EXAM: LEFT KNEE - COMPLETE 4+ VIEW  COMPARISON:  None Available.  FINDINGS: The bones are subjectively under mineralized. Moderate medial tibiofemoral joint space narrowing. Subchondral cystic change in the medial tibiofemoral compartment. Moderate tricompartmental peripheral spurring. No acute or evidence of prior fracture. No erosive change, bony destruction or focal bone lesions. Small knee joint effusion. Minimal soft tissue edema.  IMPRESSION: Tricompartmental osteoarthritis, moderate in the medial tibiofemoral compartment. Small knee joint effusion.  Electronically Signed    By: Keith Rake M.D.   On: 02/20/2022 11:56 DG Knee Complete 4 Views Right CLINICAL DATA:  Chronic bilateral knee pain. Right knee pain/arthralgia.  EXAM: RIGHT KNEE - COMPLETE 4+ VIEW  COMPARISON:  None Available.  FINDINGS: The bones are subjectively under mineralized. There is moderate medial tibiofemoral joint space narrowing. Subchondral cystic change in the medial tibiofemoral compartment. Moderate tricompartmental peripheral spurring. Ossified densities posteriorly may represent ossified intra-articular bodies, 1 May represent a fabella. There is an ossified intra-articular body in the suprapatellar joint space. No significant knee joint effusion. No acute or evidence of prior fracture. No erosive change or bone destruction.  IMPRESSION: 1. Tricompartmental osteoarthritis, moderate in the medial tibiofemoral compartment. 2. Ossified intra-articular body in the suprapatellar joint space, as well as suspected posterior intra-articular bodies.  Electronically Signed   By: Keith Rake M.D.   On: 02/20/2022 11:51 Note: Reviewed        Physical Exam  General appearance: Well nourished, well developed, and well hydrated. In no apparent acute distress Mental status: Alert, oriented x 3 (person, place, & time)       Respiratory: No evidence of acute respiratory distress Eyes: PERLA Vitals: There were no vitals taken for this visit. BMI: Estimated body mass index is 52.31 kg/m as calculated from the following:   Height as of 02/12/22: 5' 3.5" (1.613 m).   Weight as of 02/12/22: 300 lb (136.1 kg). Ideal: Patient weight not recorded  Assessment   Diagnosis Status  No diagnosis found. Controlled Controlled Controlled   Updated Problems: No problems updated.  Plan of Care  Problem-specific:  No problem-specific Assessment & Plan notes found for this encounter.  Alison Cox has a current medication list which includes the following long-term  medication(s): albuterol, amlodipine, calcium carbonate, cholecalciferol, furosemide, hydrocodone-acetaminophen, levothyroxine, losartan, magnesium oxide -mg supplement, meloxicam, and spironolactone.  Pharmacotherapy (Medications Ordered): No orders of the defined types were placed in this encounter.  Orders:  No orders of the defined types were placed in this encounter.  Follow-up plan:   No follow-ups on file.     Interventional Therapies  Risk  Complexity Considerations:   Estimated body mass index is 52.31 kg/m as calculated from the following:   Height as of this encounter: 5' 3.5" (1.613 m).   Weight as of this encounter: 300 lb (136.1 kg). WNL   Planned  Pending:      Under consideration:   None at this time. The patient's body habitus makes any interventional therapy technically challenging and decreases the chances of success. The patient has been informed that once her BMI goes down low 35, we will revisit and offer her other alternatives.   Completed:   None since joining the practice on 07/26/2018    Completed by other providers:  None at this time   Therapeutic  Palliative (PRN) options:   None established     Recent Visits Date Type Provider Dept  02/12/22 Office Visit Milinda Pointer, MD [facility]  Showing recent visits within past 90 days and meeting all other requirements Future Appointments Date Type Provider Dept  03/12/22 Appointment Milinda Pointer, MD [facility]  Showing future appointments within next 90 days and meeting all other requirements  I discussed the assessment and treatment plan with the patient. The patient was provided an opportunity to ask questions and all were answered. The patient agreed with the plan and demonstrated an understanding of the instructions.  Patient advised to call back or seek an in-person evaluation if the symptoms or condition worsens.  Duration of encounter: *** minutes.  Total  time on encounter, as per AMA guidelines included both the face-to-face and non-face-to-face time personally spent by the physician and/or other qualified health care professional(s) on the day of the encounter (includes time in activities that require the physician or other qualified health care professional and does not include time in activities normally performed by clinical staff). Physician's time may include the following activities when performed: preparing to see the patient (eg, review of tests, pre-charting review of records) obtaining and/or reviewing separately obtained history performing a medically appropriate examination and/or evaluation counseling and educating the patient/family/caregiver ordering medications, tests, or procedures referring and communicating with other health care professionals (when not separately reported) documenting clinical information in the electronic or other health record independently interpreting results (not separately reported) and communicating results to the patient/ family/caregiver care coordination (not separately reported)  Note by: Gaspar Cola, MD Date: 03/12/2022; Time: 12:45 PM

## 2022-03-12 ENCOUNTER — Encounter: Payer: Self-pay | Admitting: Pain Medicine

## 2022-03-12 ENCOUNTER — Ambulatory Visit: Payer: Medicare HMO | Attending: Pain Medicine | Admitting: Pain Medicine

## 2022-03-12 VITALS — BP 163/85 | HR 69 | Temp 97.9°F | Ht 63.0 in | Wt 313.5 lb

## 2022-03-12 DIAGNOSIS — G894 Chronic pain syndrome: Secondary | ICD-10-CM | POA: Diagnosis not present

## 2022-03-12 DIAGNOSIS — Z79891 Long term (current) use of opiate analgesic: Secondary | ICD-10-CM | POA: Insufficient documentation

## 2022-03-12 DIAGNOSIS — Z79899 Other long term (current) drug therapy: Secondary | ICD-10-CM | POA: Insufficient documentation

## 2022-03-12 DIAGNOSIS — M545 Low back pain, unspecified: Secondary | ICD-10-CM | POA: Diagnosis not present

## 2022-03-12 DIAGNOSIS — G8929 Other chronic pain: Secondary | ICD-10-CM | POA: Insufficient documentation

## 2022-03-12 DIAGNOSIS — M25571 Pain in right ankle and joints of right foot: Secondary | ICD-10-CM | POA: Diagnosis not present

## 2022-03-12 DIAGNOSIS — M4310 Spondylolisthesis, site unspecified: Secondary | ICD-10-CM | POA: Insufficient documentation

## 2022-03-12 DIAGNOSIS — M25562 Pain in left knee: Secondary | ICD-10-CM | POA: Insufficient documentation

## 2022-03-12 DIAGNOSIS — E662 Morbid (severe) obesity with alveolar hypoventilation: Secondary | ICD-10-CM | POA: Insufficient documentation

## 2022-03-12 DIAGNOSIS — Z5189 Encounter for other specified aftercare: Secondary | ICD-10-CM | POA: Insufficient documentation

## 2022-03-12 DIAGNOSIS — M47816 Spondylosis without myelopathy or radiculopathy, lumbar region: Secondary | ICD-10-CM | POA: Insufficient documentation

## 2022-03-12 DIAGNOSIS — M25572 Pain in left ankle and joints of left foot: Secondary | ICD-10-CM | POA: Diagnosis present

## 2022-03-12 DIAGNOSIS — E66813 Obesity, class 3: Secondary | ICD-10-CM | POA: Insufficient documentation

## 2022-03-12 DIAGNOSIS — Z6841 Body Mass Index (BMI) 40.0 and over, adult: Secondary | ICD-10-CM | POA: Insufficient documentation

## 2022-03-12 DIAGNOSIS — M25561 Pain in right knee: Secondary | ICD-10-CM | POA: Diagnosis not present

## 2022-03-12 MED ORDER — HYDROCODONE-ACETAMINOPHEN 10-325 MG PO TABS: 1.0000 | ORAL_TABLET | Freq: Four times a day (QID) | ORAL | 0 refills | Status: DC | PRN

## 2022-03-12 NOTE — Progress Notes (Signed)
Nursing Pain Medication Assessment:  Safety precautions to be maintained throughout the outpatient stay will include: orient to surroundings, keep bed in low position, maintain call bell within reach at all times, provide assistance with transfer out of bed and ambulation.  Medication Inspection Compliance: Pill count conducted under aseptic conditions, in front of the patient. Neither the pills nor the bottle was removed from the patient's sight at any time. Once count was completed pills were immediately returned to the patient in their original bottle.  Medication: Hydrocodone/APAP Pill/Patch Count:  32 of 120 pills remain Pill/Patch Appearance: Markings consistent with prescribed medication Bottle Appearance: Standard pharmacy container. Clearly labeled. Filled Date: 10 / 30 / 2023 Last Medication intake:  TodaySafety precautions to be maintained throughout the outpatient stay will include: orient to surroundings, keep bed in low position, maintain call bell within reach at all times, provide assistance with transfer out of bed and ambulation.

## 2022-03-12 NOTE — Patient Instructions (Signed)
____________________________________________________________________________________________  Patient Information update  To: All of our patients.  Re: Name change.  It has been made official that our current name, "[facility]"   will soon be changed to "[facility]".   The purpose of this change is to eliminate any confusion created by the concept of our practice being a "Medication Management Pain Clinic". In the past this has led to the misconception that we treat pain primarily by the use of prescription medications.  Nothing can be farther from the truth.   Understanding PAIN MANAGEMENT: To further understand what our practice does, you first have to understand that "Pain Management" is a subspecialty that requires additional training once a physician has completed their specialty training, which can be in either Anesthesia, Neurology, Psychiatry, or Physical Medicine and Rehabilitation (PMR). Each one of these contributes to the final approach taken by each physician to the management of their patient's pain. To be a "Pain Management Specialist" you must have first completed one of the specialty trainings below.  Anesthesiologists - trained in clinical pharmacology and interventional techniques such as nerve blockade and regional as well as central neuroanatomy. They are trained to block pain before, during, and after surgical interventions.  Neurologists - trained in the diagnosis and pharmacological treatment of complex neurological conditions, such as Multiple Sclerosis, Parkinson's, spinal cord injuries, and other systemic conditions that may be associated with symptoms that may include but are not limited to pain. They tend to rely primarily on the treatment of chronic pain using prescription medications.  Psychiatrist - trained in conditions affecting the psychosocial  wellbeing of patients including but not limited to depression, anxiety, schizophrenia, personality disorders, addiction, and other substance use disorders that may be associated with chronic pain. They tend to rely primarily on the treatment of chronic pain using prescription medications.   Physical Medicine and Rehabilitation (PMR) physicians, also known as physiatrists - trained to treat a wide variety of medical conditions affecting the brain, spinal cord, nerves, bones, joints, ligaments, muscles, and tendons. Their training is primarily aimed at treating patients that have suffered injuries that have caused severe physical impairment. Their training is primarily aimed at the physical therapy and rehabilitation of those patients. They may also work alongside orthopedic surgeons or neurosurgeons using their expertise in assisting surgical patients to recover after their surgeries.  INTERVENTIONAL PAIN MANAGEMENT is sub-subspecialty of Pain Management.  Our physicians are Board-certified in Anesthesia, Pain Management, and Interventional Pain Management.  This meaning that not only have they been trained and Board-certified in their specialty of Anesthesia, and subspecialty of Pain Management, but they have also received further training in the sub-subspecialty of Interventional Pain Management, in order to become Board-certified as INTERVENTIONAL PAIN MANAGEMENT SPECIALIST.    Mission: Our goal is to use our skills in  INTERVENTIONAL PAIN MANAGEMENT as alternatives to the chronic use of prescription opioid medications for the treatment of pain. To make this more clear, we have changed our name to reflect what we do and offer. We will continue to offer medication management assessment and recommendations, but we will not be taking over any patient's medication management.  ____________________________________________________________________________________________      _______________________________________________________________________  Medication Rules  Purpose: To inform patients, and their family members, of our medication rules and regulations.  Applies to: All patients receiving prescriptions from our practice (written or electronic).  Pharmacy of record: This is the pharmacy where your electronic prescriptions   will be sent. Make sure we have the correct one.  Electronic prescriptions: In compliance with the [place] Strengthen Opioid Misuse Prevention (STOP) Act of 2017 (Session Law 2017-74/H243), effective April 21, 2018, all controlled substances must be electronically prescribed. Written prescriptions, faxing, or calling prescriptions to a pharmacy will no longer be done.  Prescription refills: These will be provided only during in-person appointments. No medications will be renewed without a "face-to-face" evaluation with your provider. Applies to all prescriptions.  NOTE: The following applies primarily to controlled substances (Opioid* Pain Medications).   Type of encounter (visit): For patients receiving controlled substances, face-to-face visits are required. (Not an option and not up to the patient.)  Patient's responsibilities: Pain Pills: Bring all pain pills to every appointment (except for procedure appointments). Pill Bottles: Bring pills in original pharmacy bottle. Bring bottle, even if empty. Always bring the bottle of the most recent fill.  Medication refills: You are responsible for knowing and keeping track of what medications you are taking and when is it that you will need a refill. The day before your appointment: write a list of all prescriptions that need to be refilled. The day of the appointment: give the list to the admitting nurse. Prescriptions will be written only during appointments. No prescriptions will be written on procedure days. If you forget a medication: it will not be "Called in", "Faxed", or  "electronically sent". You will need to get another appointment to get these prescribed. No early refills. Do not call asking to have your prescription filled early. Partial  or short prescriptions: Occasionally your pharmacy may not have enough pills to fill your prescription.  NEVER ACCEPT a partial fill or a prescription that is short of the total amount of pills that you were prescribed.  With controlled substances the law allows 72 hours for the pharmacy to complete the prescription.  If the prescription is not completed within 72 hours, the pharmacist will require a new prescription to be written. This means that you will be short on your medicine and we WILL NOT send another prescription to complete your original prescription.  Instead, request the pharmacy to send a carrier to a nearby branch to get enough medication to provide you with your full prescription. Prescription Accuracy: You are responsible for carefully inspecting your prescriptions before leaving our office. Have the discharge nurse carefully go over each prescription with you, before taking them home. Make sure that your name is accurately spelled, that your address is correct. Check the name and dose of your medication to make sure it is accurate. Check the number of pills, and the written instructions to make sure they are clear and accurate. Make sure that you are given enough medication to last until your next medication refill appointment. Taking Medication: Take medication as prescribed. When it comes to controlled substances, taking less pills or less frequently than prescribed is permitted and encouraged. Never take more pills than instructed. Never take the medication more frequently than prescribed.  Inform other Doctors: Always inform, all of your healthcare providers, of all the medications you take. Pain Medication from other Providers: You are not allowed to accept any additional pain medication from any other Doctor or  Healthcare provider. There are two exceptions to this rule. (see below) In the event that you require additional pain medication, you are responsible for notifying us, as stated below. Cough Medicine: Often these contain an opioid, such as codeine or hydrocodone. Never accept or take cough medicine containing   these opioids if you are already taking an opioid* medication. The combination may cause respiratory failure and death. Medication Agreement: You are responsible for carefully reading and following our Medication Agreement. This must be signed before receiving any prescriptions from our practice. Safely store a copy of your signed Agreement. Violations to the Agreement will result in no further prescriptions. (Additional copies of our Medication Agreement are available upon request.) Laws, Rules, & Regulations: All patients are expected to follow all Federal and State Laws, Statutes, Rules, & Regulations. Ignorance of the Laws does not constitute a valid excuse.  Illegal drugs and Controlled Substances: The use of illegal substances (including, but not limited to marijuana and its derivatives) and/or the illegal use of any controlled substances is strictly prohibited. Violation of this rule may result in the immediate and permanent discontinuation of any and all prescriptions being written by our practice. The use of any illegal substances is prohibited. Adopted CDC guidelines & recommendations: Target dosing levels will be at or below 60 MME/day. Use of benzodiazepines** is not recommended.  Exceptions: There are only two exceptions to the rule of not receiving pain medications from other Healthcare Providers. Exception #1 (Emergencies): In the event of an emergency (i.e.: accident requiring emergency care), you are allowed to receive additional pain medication. However, you are responsible for: As soon as you are able, call our office [phone], at any time of the day or night, and leave a  message stating your name, the date and nature of the emergency, and the name and dose of the medication prescribed. In the event that your call is answered by a member of our staff, make sure to document and save the date, time, and the name of the person that took your information.  Exception #2 (Planned Surgery): In the event that you are scheduled by another doctor or dentist to have any type of surgery or procedure, you are allowed (for a period no longer than 30 days), to receive additional pain medication, for the acute post-op pain. However, in this case, you are responsible for picking up a copy of our "Post-op Pain Management for Surgeons" handout, and giving it to your surgeon or dentist. This document is available at our office, and does not require an appointment to obtain it. Simply go to our office during business hours (Monday-Thursday from 8:00 AM to 4:00 PM) (Friday 8:00 AM to 12:00 Noon) or if you have a scheduled appointment with us, prior to your surgery, and ask for it by name. In addition, you are responsible for: calling our office [phone], at any time of the day or night, and leaving a message stating your name, name of your surgeon, type of surgery, and date of procedure or surgery. Failure to comply with your responsibilities may result in termination of therapy involving the controlled substances. Medication Agreement Violation. Following the above rules, including your responsibilities will help you in avoiding a Medication Agreement Violation ("Breaking your Pain Medication Contract").  Consequences:  Not following the above rules may result in permanent discontinuation of medication prescription therapy.  *Opioid medications include: morphine, codeine, oxycodone, oxymorphone, hydrocodone, hydromorphone, meperidine, tramadol, tapentadol, buprenorphine, fentanyl, methadone. **Benzodiazepine medications include: diazepam (Valium), alprazolam (Xanax), clonazepam (Klonopine),  lorazepam (Ativan), clorazepate (Tranxene), chlordiazepoxide (Librium), estazolam (Prosom), oxazepam (Serax), temazepam (Restoril), triazolam (Halcion) (Last updated: 02/11/2022) ______________________________________________________________________    ______________________________________________________________________  Medication Recommendations and Reminders  Applies to: All patients receiving prescriptions (written and/or electronic).  Medication Rules & Regulations: You are responsible   for reading, knowing, and following our "Medication Rules" document. These exist for your safety and that of others. They are not flexible and neither are we. Dismissing or ignoring them is an act of "non-compliance" that may result in complete and irreversible termination of such medication therapy. For safety reasons, "non-compliance" will not be tolerated. As with the [place] fundamental legal principle of "ignorance of the law is no defense", we will accept no excuses for not having read and knowing the content of documents provided to you by our practice.  Pharmacy of record:  Definition: This is the pharmacy where your electronic prescriptions will be sent.  We do not endorse any particular pharmacy. It is up to you and your insurance to decide what pharmacy to use.  We do not restrict you in your choice of pharmacy. However, once we write for your prescriptions, we will NOT be re-sending more prescriptions to fix restricted supply problems created by your pharmacy, or your insurance.  The pharmacy listed in the electronic medical record should be the one where you want electronic prescriptions to be sent. If you choose to change pharmacy, simply notify our nursing staff. Changes will be made only during your regular appointments and not over the phone.  Recommendations: Keep all of your pain medications in a safe place, under lock and key, even if you live alone. We will NOT replace lost, stolen, or  damaged medication. We do not accept "Police Reports" as proof of medications having been stolen. After you fill your prescription, take 1 week's worth of pills and put them away in a safe place. You should keep a separate, properly labeled bottle for this purpose. The remainder should be kept in the original bottle. Use this as your primary supply, until it runs out. Once it's gone, then you know that you have 1 week's worth of medicine, and it is time to come in for a prescription refill. If you do this correctly, it is unlikely that you will ever run out of medicine. To make sure that the above recommendation works, it is very important that you make sure your medication refill appointments are scheduled at least 1 week before you run out of medicine. To do this in an effective manner, make sure that you do not leave the office without scheduling your next medication management appointment. Always ask the nursing staff to show you in your prescription , when your medication will be running out. Then arrange for the receptionist to get you a return appointment, at least 7 days before you run out of medicine. Do not wait until you have 1 or 2 pills left, to come in. This is very poor planning and does not take into consideration that we may need to cancel appointments due to bad weather, sickness, or emergencies affecting our staff. DO NOT ACCEPT A "Partial Fill": If for any reason your pharmacy does not have enough pills/tablets to completely fill or refill your prescription, do not allow for a "partial fill". The law allows the pharmacy to complete that prescription within 72 hours, without requiring a new prescription. If they do not fill the rest of your prescription within those 72 hours, you will need a separate prescription to fill the remaining amount, which we will NOT provide. If the reason for the partial fill is your insurance, you will need to talk to the pharmacist about payment alternatives for  the remaining tablets, but again, DO NOT ACCEPT A PARTIAL FILL, unless you can trust   your pharmacist to obtain the remainder of the pills within 72 hours.  Prescription refills and/or changes in medication(s):  Prescription refills, and/or changes in dose or medication, will be conducted only during scheduled medication management appointments. (Applies to both, written and electronic prescriptions.) No refills on procedure days. No medication will be changed or started on procedure days. No changes, adjustments, and/or refills will be conducted on a procedure day. Doing so will interfere with the diagnostic portion of the procedure. No phone refills. No medications will be "called into the pharmacy". No Fax refills. No weekend refills. No Holliday refills. No after hours refills.  Remember:  Business hours are:  Monday to Thursday 8:00 AM to 4:00 PM Provider's Schedule: Suella Cogar, MD - Appointments are:  Medication management: Monday and Wednesday 8:00 AM to 4:00 PM Procedure day: Tuesday and Thursday 7:30 AM to 4:00 PM Bilal Lateef, MD - Appointments are:  Medication management: Tuesday and Thursday 8:00 AM to 4:00 PM Procedure day: Monday and Wednesday 7:30 AM to 4:00 PM (Last update: 02/11/2022) ______________________________________________________________________    ____________________________________________________________________________________________  Pharmacy Shortages of Pain Medication   Introduction Shockingly as it may seem, .  "No U.S[place] Supreme Court decision has ever interpreted the Constitution as guaranteeing a right to health care for all Americans." - https://www.healthequityandpolicylab.com/elusive-right-to-health-care-under-us-law  "With respect to human rights, the [place] has no formally codified right to health, nor does it participate in a human rights treaty that specifies a right to health." - Scott J. Schweikart, JD,  MBE  Situation By now, most of our patients have had the experience of being told by their pharmacist that they do not have enough medication to cover their prescription. If you have not had this experience, just know that you soon will.  Problem There appears to be a shortage of these medications, either at the national level or locally. This is happening with all pharmacies. When there is not enough medication, patients are offered a partial fill and they are told that they will try to get the rest of the medicine for them at a later time. If they do not have enough for even a partial fill, the pharmacists are telling the patients to call us (the prescribing physicians) to request that we send another prescription to another pharmacy to get the medicine.   This reordering of a controlled substance creates documentation problems where additional paperwork needs to be created to explain why two prescriptions for the same period of time and the same medicine are being prescribed to the same patient. It also creates situations where the last appointment note does not accurately reflect when and what prescriptions were given to a patient. This leads to prescribing errors down the line, in subsequent follow-up visits.   [place] Board of Pharmacy ([place]BOP) Research revealed that Board of Pharmacy Rule .1806 (21 NCAC 46.1806) authorizes pharmacists to the transfer of prescriptions among pharmacies, and it sets forth procedural and recordkeeping requirements for doing so. However, this requires the pharmacist to complete the previously mentioned procedural paperwork to accomplish the transfer. As it turns out, it is much easier for them to have the prescribing physicians do the work.   Possible solutions 1. You can ask your physician to assist you in weaning yourself off these medications. 2. Ask your pharmacy if the medication is in stock, 3 days prior to your refill. 3. If you need a pharmacy change,  let us know at your medication management visit. Prescriptions that have already been   electronically sent to a pharmacy will not be re-sent to a different pharmacy if your pharmacy of record does not have it in stock. Proper stocking of medication is a pharmacy problem, not a prescriber problem. Work with your pharmacist to solve the problem. 4. Have the [place] Assembly add a provision to the "STOP ACT" (the law that mandates how controlled substances are prescribed) where there is an exception to the electronic prescribing rule that states that in the event there are shortages of medications the physicians are allowed to use written prescriptions as opposed to electronic ones. This would allow patients to take their prescriptions to a different pharmacy that may have enough medication available to fill the prescription. The problem is that currently there is a law that does not allow for written prescriptions, with the exception of instances where the electronic medical record is down due to technical issues.  5. Have [place] Congress ease the pressure on pharmaceutical companies, allowing them to produce enough quantities of the medication to adequately supply the population. 6. Have pharmacies keep enough stocks of these medications to cover their client base.  7. Have the [place] State Assembly add a provision to the "STOP ACT" where they ease the regulations surrounding the transfer of controlled substances between pharmacies, so as to simplify the transfer of supplies. As an alternative, develop a system to allow patients to obtain the remainder of their prescription at another one of their pharmacies or at an associate pharmacy.   How this shortage will affect you.  Understand that this is a pharmacy supply problem, not a prescriber problem. Work with your pharmacy to solve it. The job of the prescriber is to evaluate and monitor the patient for the appropriate indications and use of  these medicines. It is not the job of the prescriber to supply the medication or to solve problems with that supply. The responsibility and the choice to obtain the medication resides on the patient. By law, supplying the medication is the job of the pharmacy. It is certainly not the job of the prescriber to solve supply problems.   Due to the above problems we are no longer taking patients to write for their pain medication. Future discussions with your physician may include potentially weaning medications or transitioning to alternatives.  We will be focusing primarily on interventional based pain management. We will continue to evaluate for appropriate indications and we may provide recommendations regarding medication, dose, and schedule, as well as monitoring recommendations, however, we will not be taking over the actual prescribing of these substances. On those patients where we are treating their chronic pain with interventional therapies, exceptions will be considered on a case by case basis. At this time, we will try to continue providing this supplemental service to those patients we have been managing in the past. However, as of August 1st, 2023, we no longer will be sending additional prescriptions to other pharmacies for the purpose of solving their supply problems. Once we send a prescription to a pharmacy, we will not be resending it again to another pharmacy to cover for their shortages.   What to do. Write as many letters as you can. Recruit the help of family members in writing these letters. Below are some of the places where you can write to make your voice heard. Let them know what the problem is and push them to look for solutions.   Search internet for: "[place] find your legislators" https://www.ncleg.gov/findyourlegislators  Search   internet for: "[employer] commissioner  complaints" Starlas.fi  Search internet for: "[place] complaints" https://www.hernandez-brewer.com/.htm  Search internet for: "[facility] pharmacy complaints" Email [facility] Customer Relations woondaal.com.jsp?callType=store  Search internet for: Programme researcher, broadcasting/film/video customer service complaints" https://www.[facility].com/topic/marketing/contactus/contactus_customerservice.jsp  ____________________________________________________________________________________________     ____________________________________________________________________________________________  Drug Holidays (Slow)  What is a "Drug Holiday"? Drug Holiday: is the name given to the period of time during which a patient stops taking a medication(s) for the purpose of eliminating tolerance to the drug.  Benefits Improved effectiveness of opioids. Decreased opioid dose needed to achieve benefits. Improved pain with lesser dose.  What is tolerance? Tolerance: is the progressive decreased in effectiveness of a drug due to its repetitive use. With repetitive use, the body gets use to the medication and as a consequence, it loses its effectiveness. This is a common problem seen with opioid pain medications. As a result, a larger dose of the drug is needed to achieve the same effect that used to be obtained with a smaller dose.  How long should a "Drug Holiday" last? You should stay off of the pain medicine for at least 14 consecutive days. (2 weeks)  Should I stop the medicine "cold [place]"? No. You should always coordinate with your Pain Specialist so that he/she can provide you with the correct medication dose to make the transition as smoothly as possible.  How do I stop the medicine? Slowly. You will be instructed to decrease the daily amount of pills that you take by one (1) pill every seven (7) days.  This is called a "slow downward taper" of your dose. For example: if you normally take four (4) pills per day, you will be asked to drop this dose to three (3) pills per day for seven (7) days, then to two (2) pills per day for seven (7) days, then to one (1) per day for seven (7) days, and at the end of those last seven (7) days, this is when the "Drug Holiday" would start.   Will I have withdrawals? By doing a "slow downward taper" like this one, it is unlikely that you will experience any significant withdrawal symptoms. Typically, what triggers withdrawals is the sudden stop of a high dose opioid therapy. Withdrawals can usually be avoided by slowly decreasing the dose over a prolonged period of time. If you do not follow these instructions and decide to stop your medication abruptly, withdrawals may be possible.  What are withdrawals? Withdrawals: refers to the wide range of symptoms that occur after stopping or dramatically reducing opiate drugs after heavy and prolonged use. Withdrawal symptoms do not occur to patients that use low dose opioids, or those who take the medication sporadically. Contrary to benzodiazepine (example: Valium, Xanax, etc.) or alcohol withdrawals ("Delirium Tremens"), opioid withdrawals are not lethal. Withdrawals are the physical manifestation of the body getting rid of the excess receptors.  Expected Symptoms Early symptoms of withdrawal may include: Agitation Anxiety Muscle aches Increased tearing Insomnia Runny nose Sweating Yawning  Late symptoms of withdrawal may include: Abdominal cramping Diarrhea Dilated pupils Goose bumps Nausea Vomiting  Will I experience withdrawals? Due to the slow nature of the taper, it is very unlikely that you will experience any.  What is a slow taper? Taper: refers to the gradual decrease in dose.  (Last update:  11/09/2019) ____________________________________________________________________________________________    ____________________________________________________________________________________________  CBD (cannabidiol) & Delta-8 (Delta-8 tetrahydrocannabinol) WARNING  Intro: Cannabidiol (CBD) and tetrahydrocannabinol (THC), are two natural compounds found in  plants of the Cannabis genus. They can both be extracted from hemp or cannabis. Hemp and cannabis come from the Cannabis sativa plant. Both compounds interact with your body's endocannabinoid system, but they have very different effects. CBD does not produce the high sensation associated with cannabis. Delta-8 tetrahydrocannabinol, also known as delta-8 THC, is a psychoactive substance found in the Cannabis sativa plant, of which marijuana and hemp are two varieties. THC is responsible for the high associated with the illicit use of marijuana.  Applicable to: All individuals currently taking or considering taking CBD (cannabidiol) and, more important, all patients taking opioid analgesic controlled substances (pain medication). (Example: oxycodone; oxymorphone; hydrocodone; hydromorphone; morphine; methadone; tramadol; tapentadol; fentanyl; buprenorphine; butorphanol; dextromethorphan; meperidine; codeine; etc.)  Legal status: CBD remains a Schedule I drug prohibited for any use. CBD is illegal with one exception. In the [place], CBD has a limited Transport planner (FDA) approval for the treatment of two specific types of epilepsy disorders. Only one CBD product has been approved by the FDA for this purpose: "Epidiolex". FDA is aware that some companies are marketing products containing cannabis and cannabis-derived compounds in ways that violate the [employer], Drug and Cosmetic Act [facility] Act) and that may put the health and safety of consumers at risk. The FDA, a Federal agency, has not enforced the CBD status since 2018.  UPDATE: (06/07/2021) The Drug Enforcement Agency ([place]) issued a letter stating that "delta" cannabinoids, including Delta-8-THCO and Delta-9-THCO, synthetically derived from hemp do not qualify as hemp and will be viewed as Schedule I drugs. (Schedule I drugs, substances, or chemicals are defined as drugs with no currently accepted medical use and a high potential for abuse. Some examples of Schedule I drugs are: heroin, lysergic acid diethylamide (LSD), marijuana (cannabis), 3,4-methylenedioxymethamphetamine (ecstasy), methaqualone, and peyote.) (https://jennings.com/)  Legality: Some manufacturers ship CBD products nationally, which is illegal. Often such products are sold online and are therefore available throughout the country. CBD is openly sold in head shops and health food stores in some states where such sales have not been explicitly legalized. Selling unapproved products with unsubstantiated therapeutic claims is not only a violation of the law, but also can put patients at risk, as these products have not been proven to be safe or effective. Federal illegality makes it difficult to conduct research on CBD.  Reference: "FDA Regulation of Cannabis and Cannabis-Derived Products, Including Cannabidiol (CBD)" - [employer]Art[employer].pt  Warning: CBD is not FDA approved and has not undergo the same manufacturing controls as prescription drugs.  This means that the purity and safety of available CBD may be questionable. Most of the time, despite manufacturer's claims, it is contaminated with THC (delta-9-tetrahydrocannabinol - the chemical in marijuana responsible for the "HIGH").  When this is the case, the [facility] contaminant will trigger a positive urine drug screen (UDS) test for Marijuana (carboxy-THC). Because a positive UDS for any illicit substance is a violation of our medication agreement, your  opioid analgesics (pain medicine) may be permanently discontinued. The FDA recently put out a warning about 5 things that everyone should be aware of regarding Delta-8 THC: Delta-8 THC products have not been evaluated or approved by the FDA for safe use and may be marketed in ways that put the public health at risk. The FDA has received adverse event reports involving delta-8 THC-containing products. Delta-8 THC has psychoactive and intoxicating effects. Delta-8 THC manufacturing often involve use of potentially harmful chemicals to create the concentrations of delta-8 THC claimed in  the marketplace. The final delta-8 THC product may have potentially harmful by-products (contaminants) due to the chemicals used in the process. Manufacturing of delta-8 THC products may occur in uncontrolled or unsanitary settings, which may lead to the presence of unsafe contaminants or other potentially harmful substances. Delta-8 THC products should be kept out of the reach of children and pets.  MORE ABOUT CBD  General Information: CBD was discovered in [birth year] and it is a derivative of the cannabis sativa genus plants (Marijuana and Hemp). It is one of the 113 identified substances found in Marijuana. It accounts for up to 40% of the plant's extract. As of 2018, preliminary clinical studies on CBD included research for the treatment of anxiety, movement disorders, and pain. CBD is available and consumed in multiple forms, including inhalation of smoke or vapor, as an aerosol spray, and by mouth. It may be supplied as an oil containing CBD, capsules, dried cannabis, or as a liquid solution. CBD is thought not to be as psychoactive as THC (delta-9-tetrahydrocannabinol - the chemical in marijuana responsible for the "HIGH"). Studies suggest that CBD may interact with different biological target receptors in the body, including cannabinoid and other neurotransmitter receptors. As of 2018 the mechanism of action for its  biological effects has not been determined.  Side-effects  Adverse reactions: Dry mouth, diarrhea, decreased appetite, fatigue, drowsiness, malaise, weakness, sleep disturbances, and others.  Drug interactions: CBC may interact with other medications such as blood-thinners. Because CBD causes drowsiness on its own, it also increases the drowsiness caused by other medications, including antihistamines (such as Benadryl), benzodiazepines (Xanax, Ativan, Valium), antipsychotics, antidepressants and opioids, as well as alcohol and supplements such as kava, melatonin and St. John's Wort. Be cautious with the following combinations:   Brivaracetam (Briviact) Brivaracetam is changed and broken down by the body. CBD might decrease how quickly the body breaks down brivaracetam. This might increase levels of brivaracetam in the body.  Caffeine Caffeine is changed and broken down by the body. CBD might decrease how quickly the body breaks down caffeine. This might increase levels of caffeine in the body.  Carbamazepine (Tegretol) Carbamazepine is changed and broken down by the body. CBD might decrease how quickly the body breaks down carbamazepine. This might increase levels of carbamazepine in the body and increase its side effects.  Citalopram (Celexa) Citalopram is changed and broken down by the body. CBD might decrease how quickly the body breaks down citalopram. This might increase levels of citalopram in the body and increase its side effects.  Clobazam (Onfi) Clobazam is changed and broken down by the liver. CBD might decrease how quickly the liver breaks down clobazam. This might increase the effects and side effects of clobazam.  Eslicarbazepine (Aptiom) Eslicarbazepine is changed and broken down by the body. CBD might decrease how quickly the body breaks down eslicarbazepine. This might increase levels of eslicarbazepine in the body by a small amount.  Everolimus (Zostress) Everolimus is  changed and broken down by the body. CBD might decrease how quickly the body breaks down everolimus. This might increase levels of everolimus in the body.  Lithium Taking higher doses of CBD might increase levels of lithium. This can increase the risk of lithium toxicity.  Medications changed by the liver (Cytochrome P450 1A1 (CYP1A1) substrates) Some medications are changed and broken down by the liver. CBD might change how quickly the liver breaks down these medications. This could change the effects and side effects of these medications.  Medications changed by  the liver (Cytochrome P450 1A2 (CYP1A2) substrates) Some medications are changed and broken down by the liver. CBD might change how quickly the liver breaks down these medications. This could change the effects and side effects of these medications.  Medications changed by the liver (Cytochrome P450 1B1 (CYP1B1) substrates) Some medications are changed and broken down by the liver. CBD might change how quickly the liver breaks down these medications. This could change the effects and side effects of these medications.  Medications changed by the liver (Cytochrome P450 2A6 (CYP2A6) substrates) Some medications are changed and broken down by the liver. CBD might change how quickly the liver breaks down these medications. This could change the effects and side effects of these medications.  Medications changed by the liver (Cytochrome P450 2B6 (CYP2B6) substrates) Some medications are changed and broken down by the liver. CBD might change how quickly the liver breaks down these medications. This could change the effects and side effects of these medications.  Medications changed by the liver (Cytochrome P450 2C19 (CYP2C19) substrates) Some medications are changed and broken down by the liver. CBD might change how quickly the liver breaks down these medications. This could change the effects and side effects of these  medications.  Medications changed by the liver (Cytochrome P450 2C8 (CYP2C8) substrates) Some medications are changed and broken down by the liver. CBD might change how quickly the liver breaks down these medications. This could change the effects and side effects of these medications.  Medications changed by the liver (Cytochrome P450 2C9 (CYP2C9) substrates) Some medications are changed and broken down by the liver. CBD might change how quickly the liver breaks down these medications. This could change the effects and side effects of these medications.  Medications changed by the liver (Cytochrome P450 2D6 (CYP2D6) substrates) Some medications are changed and broken down by the liver. CBD might change how quickly the liver breaks down these medications. This could change the effects and side effects of these medications.  Medications changed by the liver (Cytochrome P450 2E1 (CYP2E1) substrates) Some medications are changed and broken down by the liver. CBD might change how quickly the liver breaks down these medications. This could change the effects and side effects of these medications.  Medications changed by the liver (Cytochrome P450 3A4 (CYP3A4) substrates) Some medications are changed and broken down by the liver. CBD might change how quickly the liver breaks down these medications. This could change the effects and side effects of these medications.  Medications changed by the liver (Glucuronidated drugs) Some medications are changed and broken down by the liver. CBD might change how quickly the liver breaks down these medications. This could change the effects and side effects of these medications.  Medications that decrease the breakdown of other medications by the liver (Cytochrome P450 2C19 (CYP2C19) inhibitors) CBD is changed and broken down by the liver. Some drugs decrease how quickly the liver changes and breaks down CBD. This could change the effects and side effects of  CBD.  Medications that decrease the breakdown of other medications in the liver (Cytochrome P450 3A4 (CYP3A4) inhibitors) CBD is changed and broken down by the liver. Some drugs decrease how quickly the liver changes and breaks down CBD. This could change the effects and side effects of CBD.  Medications that increase breakdown of other medications by the liver (Cytochrome P450 3A4 (CYP3A4) inducers) CBD is changed and broken down by the liver. Some drugs increase how quickly the liver changes and breaks down  CBD. This could change the effects and side effects of CBD.  Medications that increase the breakdown of other medications by the liver (Cytochrome P450 2C19 (CYP2C19) inducers) CBD is changed and broken down by the liver. Some drugs increase how quickly the liver changes and breaks down CBD. This could change the effects and side effects of CBD.  Methadone (Dolophine) Methadone is broken down by the liver. CBD might decrease how quickly the liver breaks down methadone. Taking cannabidiol along with methadone might increase the effects and side effects of methadone.  Rufinamide (Banzel) Rufinamide is changed and broken down by the body. CBD might decrease how quickly the body breaks down rufinamide. This might increase levels of rufinamide in the body by a small amount.  Sedative medications (CNS depressants) CBD might cause sleepiness and slowed breathing. Some medications, called sedatives, can also cause sleepiness and slowed breathing. Taking CBD with sedative medications might cause breathing problems and/or too much sleepiness.  Sirolimus (Rapamune) Sirolimus is changed and broken down by the body. CBD might decrease how quickly the body breaks down sirolimus. This might increase levels of sirolimus in the body.  Stiripentol (Diacomit) Stiripentol is changed and broken down by the body. CBD might decrease how quickly the body breaks down stiripentol. This might increase levels of  stiripentol in the body and increase its side effects.  Tacrolimus (Prograf) Tacrolimus is changed and broken down by the body. CBD might decrease how quickly the body breaks down tacrolimus. This might increase levels of tacrolimus in the body.  Tamoxifen (Soltamox) Tamoxifen is changed and broken down by the body. CBD might affect how quickly the body breaks down tamoxifen. This might affect levels of tamoxifen in the body.  Topiramate (Topamax) Topiramate is changed and broken down by the body. CBD might decrease how quickly the body breaks down topiramate. This might increase levels of topiramate in the body by a small amount.  Valproate Valproic acid can cause liver injury. Taking cannabidiol with valproic acid might increase the chance of liver injury. CBD and/or valproic acid might need to be stopped, or the dose might need to be reduced.  Warfarin (Coumadin) CBD might increase levels of warfarin, which can increase the risk for bleeding. CBD and/or warfarin might need to be stopped, or the dose might need to be reduced.  Zonisamide Zonisamide is changed and broken down by the body. CBD might decrease how quickly the body breaks down zonisamide. This might increase levels of zonisamide in the body by a small amount. (Last update: 06/19/2021) ____________________________________________________________________________________________

## 2022-04-08 NOTE — Telephone Encounter (Signed)
Spoke with patient, this has already been addressed.

## 2022-04-30 DIAGNOSIS — H25812 Combined forms of age-related cataract, left eye: Secondary | ICD-10-CM | POA: Diagnosis not present

## 2022-04-30 DIAGNOSIS — Z947 Corneal transplant status: Secondary | ICD-10-CM | POA: Diagnosis not present

## 2022-04-30 DIAGNOSIS — H18553 Macular corneal dystrophy, bilateral: Secondary | ICD-10-CM | POA: Diagnosis not present

## 2022-05-06 ENCOUNTER — Other Ambulatory Visit: Payer: Medicare Other

## 2022-05-16 ENCOUNTER — Other Ambulatory Visit: Payer: Self-pay | Admitting: Pain Medicine

## 2022-05-16 DIAGNOSIS — G894 Chronic pain syndrome: Secondary | ICD-10-CM

## 2022-05-16 DIAGNOSIS — M47816 Spondylosis without myelopathy or radiculopathy, lumbar region: Secondary | ICD-10-CM

## 2022-05-16 DIAGNOSIS — Z79891 Long term (current) use of opiate analgesic: Secondary | ICD-10-CM

## 2022-05-16 DIAGNOSIS — M4310 Spondylolisthesis, site unspecified: Secondary | ICD-10-CM

## 2022-05-16 DIAGNOSIS — Z79899 Other long term (current) drug therapy: Secondary | ICD-10-CM

## 2022-05-16 DIAGNOSIS — Z5189 Encounter for other specified aftercare: Secondary | ICD-10-CM

## 2022-05-16 DIAGNOSIS — G8929 Other chronic pain: Secondary | ICD-10-CM

## 2022-05-19 ENCOUNTER — Other Ambulatory Visit: Payer: Self-pay

## 2022-05-19 ENCOUNTER — Telehealth: Payer: Self-pay | Admitting: Pain Medicine

## 2022-05-19 DIAGNOSIS — M47816 Spondylosis without myelopathy or radiculopathy, lumbar region: Secondary | ICD-10-CM

## 2022-05-19 DIAGNOSIS — Z79899 Other long term (current) drug therapy: Secondary | ICD-10-CM

## 2022-05-19 DIAGNOSIS — M545 Low back pain, unspecified: Secondary | ICD-10-CM

## 2022-05-19 DIAGNOSIS — Z5189 Encounter for other specified aftercare: Secondary | ICD-10-CM

## 2022-05-19 DIAGNOSIS — G894 Chronic pain syndrome: Secondary | ICD-10-CM

## 2022-05-19 DIAGNOSIS — M4310 Spondylolisthesis, site unspecified: Secondary | ICD-10-CM

## 2022-05-19 DIAGNOSIS — Z79891 Long term (current) use of opiate analgesic: Secondary | ICD-10-CM

## 2022-05-19 DIAGNOSIS — G8929 Other chronic pain: Secondary | ICD-10-CM

## 2022-05-19 MED ORDER — HYDROCODONE-ACETAMINOPHEN 10-325 MG PO TABS: 1.0000 | ORAL_TABLET | Freq: Four times a day (QID) | ORAL | 0 refills | Status: DC | PRN

## 2022-05-19 NOTE — Telephone Encounter (Signed)
Patient's pharmacy does not have her medication. She says [facility] on [address] [place] has the meds. She wants script transferred. Please advise patient

## 2022-05-19 NOTE — Telephone Encounter (Signed)
Refill medication sent to Dr Holley Raring

## 2022-05-30 NOTE — Progress Notes (Unsigned)
PROVIDER NOTE: Information contained herein reflects review and annotations entered in association with encounter. Interpretation of such information and data should be left to medically-trained personnel. Information provided to patient can be located elsewhere in the medical record under "Patient Instructions". Document created using STT-dictation technology, any transcriptional errors that may result from process are unintentional.    Patient: Alison Cox  Service Category: E/M  Provider: Gaspar Cola, MD  DOB: 15-Feb-1956  DOS: 06/04/2022  Referring Provider: Lajean Manes, MD  MRN: [id number]  Specialty: Interventional Pain Management  PCP: Alison Manes, MD  Type: Established Patient  Setting: Ambulatory outpatient    Location: Office  Delivery: Face-to-face     HPI  [name], a 67 y.o. year old female, is here today because of her No primary diagnosis found.. [name] primary complain today is No chief complaint on file. Last encounter: My last encounter with her was on 05/19/2022. Pertinent problems: 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 Pain Assessment: Severity of   is reported as a  /10. Location:    / . Onset:  . Quality:  . Timing:  . Modifying factor(s):  [name] Kitchen Vitals:  vitals were not taken for this visit.  BMI: Estimated body mass index is 55.53 kg/m as calculated from the following:   Height as of 03/12/22: 5' 3"$  (1.6 m).   Weight as of  03/12/22: 313 lb 8 oz (142.2 kg).  Reason for encounter:  *** . ***  Pharmacotherapy Assessment  Analgesic: Hydrocodone/APAP 10/325 one tablet by mouth every 6 hours (total: 40 mg/day) MME/day: 40 mg/day.   Monitoring: [place] PMP: PDMP reviewed during this encounter.       Pharmacotherapy: No side-effects or adverse reactions reported. Compliance: No problems identified. Effectiveness: Clinically acceptable.  No notes on file  No results found for: "CBDTHCR" No results found for: "D8THCCBX" No results found for: "D9THCCBX"  UDS:  Summary  Date Value Ref Range Status  02/12/2022 Note  Final    Comment:    ==================================================================== [facility] (MW) ==================================================================== Test                             Result       Flag       Units  Drug Present and Declared for Prescription Verification   Hydrocodone                    >2551        EXPECTED   ng/mg creat   Hydromorphone                  45           EXPECTED   ng/mg creat   Dihydrocodeine                 736          EXPECTED   ng/mg creat   Norhydrocodone                 >  1276        EXPECTED   ng/mg creat    Sources of hydrocodone include scheduled prescription medications.    Hydromorphone, dihydrocodeine and norhydrocodone are expected    metabolites of hydrocodone. Hydromorphone and dihydrocodeine are    also available as scheduled prescription medications.  ==================================================================== Test                      Result    Flag   Units      Ref Range   Creatinine              392              mg/dL      >=20 ==================================================================== Declared Medications:  The flagging and interpretation on this report are based on the  following declared medications.  Unexpected results may arise from  inaccuracies in the declared medications.   **Note: The  testing scope of this panel includes these medications:   Hydrocodone (Norco)   **Note: The testing scope of this panel does not include the  following reported medications:   Acetaminophen (Norco)  Albuterol  Amlodipine  Calcium  Cholecalciferol  Ciclopirox  Doxycycline  Eye Drop  Fluocinolone  Furosemide (Lasix)  Ketoconazole (Nizoral)  Levothyroxine  Losartan  Magnesium (Mag-Ox)  Meloxicam  Naloxone (Narcan)  Nystatin  Spironolactone  Topical  Triamcinolone ==================================================================== For clinical consultation, please call [phone]. ====================================================================       ROS  Constitutional: Denies any fever or chills Gastrointestinal: No reported hemesis, hematochezia, vomiting, or acute GI distress Musculoskeletal: Denies any acute onset joint swelling, redness, loss of ROM, or weakness Neurological: No reported episodes of acute onset apraxia, aphasia, dysarthria, agnosia, amnesia, paralysis, loss of coordination, or loss of consciousness  Medication Review  Cholecalciferol, Ciclopirox, Crisaborole, Difluprednate, Fluocinolone Acetonide, Fluocinolone Acetonide Body, HYDROcodone-acetaminophen, Ivermectin, Magnesium Oxide -Mg Supplement, albuterol, amLODipine, calcium carbonate, doxycycline, furosemide, ketoconazole, levothyroxine, losartan, meloxicam, naloxone, nystatin-triamcinolone, and spironolactone  History Review  Allergy: [name] is allergic to penicillins, shellfish allergy, and sulfa antibiotics. Drug: [name]  reports no history of drug use. Alcohol:  reports no history of alcohol use. Tobacco:  reports that she has never smoked. She has never used smokeless tobacco. Social: [name]  reports that she has never smoked. She has never used smokeless tobacco. She reports that she does not drink alcohol and does not use drugs. 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 Surgical: Ms. Dikeman  has a past surgical history that includes Endometrial ablation; DILATATION AND CURETTAGE; Corneal transplant; and Corneal transplant. Family: family history includes Hypertension in her father and mother; Thyroid disease in her father.  Laboratory Chemistry Profile   Renal Lab Results  Component Value Date   BUN 15 02/12/2022   CREATININE 1.00 02/12/2022   BCR 15 02/12/2022   GFRAA 70 01/17/2019   GFRNONAA 61 01/17/2019    Hepatic Lab Results  Component Value Date   AST 27 02/12/2022   ALT 47 05/01/2016   ALBUMIN 4.8 02/12/2022   ALKPHOS 114 02/12/2022    Electrolytes Lab Results  Component Value Date   NA 141 02/12/2022   K 4.0 02/12/2022   CL 103 02/12/2022   CALCIUM 10.2 02/12/2022   MG 1.9 02/12/2022    Bone Lab Results  Component Value Date   25OHVITD1 30 02/12/2022   25OHVITD2 <1.0 02/12/2022   25OHVITD3 30 02/12/2022    Inflammation (CRP: Acute Phase) (ESR: Chronic Phase) Lab Results  Component Value Date   CRP 8 02/12/2022   ESRSEDRATE 22 02/12/2022         Note: Above Lab results reviewed.  Recent Imaging Review  DG HIP UNILAT W OR W/O PELVIS 2-3 VIEWS LEFT CLINICAL DATA:  Left hip pain/arthralgia. Chronic left hip pain.  EXAM: DG HIP (WITH OR WITHOUT PELVIS) 2-3V LEFT  COMPARISON:  None Available.  FINDINGS: There is bilateral hip joint space narrowing, left greater than right. Mild bilateral acetabular spurring, left greater than right. The femoral head is seated in the acetabulum. No evidence of fracture. Detailed joint space assessment is limited due to soft tissue attenuation from habitus, allowing for this no evidence of erosion or avascular  necrosis. No evidence of bony destructive change or focal lesion. There is degenerative change of both sacroiliac joints. No sacroiliac joint or pubic symphyseal widening.  IMPRESSION: 1. Bilateral hip osteoarthritis, left greater than right. 2. Degenerative change of the sacroiliac joints.  Electronically Signed   By: Keith Rake M.D.   On: 02/20/2022 12:02 DG Lumbar Spine Complete W/Bend CLINICAL DATA:  Lumbar spondylosis. Chronic bilateral low back pain without sciatica. Anterolisthesis.  EXAM: LUMBAR SPINE - COMPLETE WITH BENDING VIEWS  COMPARISON:  Lumbar radiograph 10/11/2011  FINDINGS: Soft tissue attenuation from habitus limits detailed assessment. There is subjective bony under mineralization. Five non-rib-bearing lumbar vertebra. There is trace anterolisthesis of L4 on L5 and trace retrolisthesis of L2 on L3 that is unchanged on flexion and extension, however there is limited range of motion. Disc space narrowing with spurring at L2-L3, L3-L4, L4-L5 and L5-S1, progression from 2013 exam. Moderate facet hypertrophy from L2-L3 through the lumbosacral junction, also progressed. Normal vertebral body heights. No evidence of fracture compression deformity. No obvious bony destructive change or focal lesion.  IMPRESSION: 1. Multilevel degenerative disc disease and facet hypertrophy of the lumbar spine, progression from 2013 exam. 2. Trace anterolisthesis of L4 on L5 and trace retrolisthesis of L2 on L3, unchanged on flexion and extension.  Electronically Signed   By: Keith Rake M.D.   On: 02/20/2022 12:01 DG Ankle Complete Left CLINICAL DATA:  Left ankle pain. Chronic pain of both ankles.  EXAM: LEFT ANKLE COMPLETE - 3+ VIEW  COMPARISON:  None Available.  FINDINGS: Normal alignment. The ankle mortise is preserved. Cystic change in the medial talar dome. There is tibial talar spurring. Chronic corticated densities adjacent to the medial malleolus.  Fragmented plantar calcaneal spur and small Achilles tendon enthesophyte. No acute fracture. No erosive change or bony destruction. No ankle joint effusion.  IMPRESSION: 1. Mild osteoarthritis of the ankle joint. Cystic change in the medial talar dome may be degenerative or secondary to osteochondral lesion. 2. Fragmented plantar calcaneal spur and small Achilles tendon enthesophyte.  Electronically Signed   By: Keith Rake M.D.   On: 02/20/2022 11:59 DG Ankle Complete Right CLINICAL DATA:  Chronic pain of both ankles. Right ankle pain.  EXAM: RIGHT ANKLE - COMPLETE 3+ VIEW  COMPARISON:  None Available.  FINDINGS: Normal alignment. Ankle mortise is preserved. Cystic change in the medial talar dome may be related to degenerative change or osteochondral defect. There is tibial talar spurring. No acute or evidence of prior fracture. Chronic corticated densities adjacent to the medial malleolus. There is a small plantar calcaneal spur and Achilles tendon enthesophyte.  No significant ankle joint effusion. No erosive change or bony destruction.  IMPRESSION: 1. Tibiotalar osteoarthritis. Cystic change in the medial talar dome may be related to degenerative change or osteochondral defect. 2. Small plantar calcaneal spur and Achilles tendon enthesophyte.  Electronically Signed   By: Keith Rake M.D.   On: 02/20/2022 11:57 DG Knee Complete 4 Views Left CLINICAL DATA:  Left knee pain/arthralgia. Chronic bilateral knee pain.  EXAM: LEFT KNEE - COMPLETE 4+ VIEW  COMPARISON:  None Available.  FINDINGS: The bones are subjectively under mineralized. Moderate medial tibiofemoral joint space narrowing. Subchondral cystic change in the medial tibiofemoral compartment. Moderate tricompartmental peripheral spurring. No acute or evidence of prior fracture. No erosive change, bony destruction or focal bone lesions. Small knee joint effusion. Minimal soft tissue  edema.  IMPRESSION: Tricompartmental osteoarthritis, moderate in the medial tibiofemoral compartment. Small knee joint effusion.  Electronically Signed   By: Keith Rake M.D.   On: 02/20/2022 11:56 DG Knee Complete 4 Views Right CLINICAL DATA:  Chronic bilateral knee pain. Right knee pain/arthralgia.  EXAM: RIGHT KNEE - COMPLETE 4+ VIEW  COMPARISON:  None Available.  FINDINGS: The bones are subjectively under mineralized. There is moderate medial tibiofemoral joint space narrowing. Subchondral cystic change in the medial tibiofemoral compartment. Moderate tricompartmental peripheral spurring. Ossified densities posteriorly may represent ossified intra-articular bodies, 1 May represent a fabella. There is an ossified intra-articular body in the suprapatellar joint space. No significant knee joint effusion. No acute or evidence of prior fracture. No erosive change or bone destruction.  IMPRESSION: 1. Tricompartmental osteoarthritis, moderate in the medial tibiofemoral compartment. 2. Ossified intra-articular body in the suprapatellar joint space, as well as suspected posterior intra-articular bodies.  Electronically Signed   By: Keith Rake M.D.   On: 02/20/2022 11:51 Note: Reviewed        Physical Exam  General appearance: Well nourished, well developed, and well hydrated. In no apparent acute distress Mental status: Alert, oriented x 3 (person, place, & time)       Respiratory: No evidence of acute respiratory distress Eyes: PERLA Vitals: There were no vitals taken for this visit. BMI: Estimated body mass index is 55.53 kg/m as calculated from the following:   Height as of 03/12/22: 5' 3"$  (1.6 m).   Weight as of 03/12/22: 313 lb 8 oz (142.2 kg). Ideal: Patient weight not recorded  Assessment   Diagnosis Status  No diagnosis found. Controlled Controlled Controlled   Updated Problems: No problems updated.  Plan of Care  Problem-specific:  No  problem-specific Assessment & Plan notes found for this encounter.  [name] has a current medication list which includes the following long-term medication(s): albuterol, amlodipine, calcium carbonate, cholecalciferol, furosemide, hydrocodone-acetaminophen, hydrocodone-acetaminophen, hydrocodone-acetaminophen, levothyroxine, losartan, magnesium oxide -mg supplement, meloxicam, and spironolactone.  Pharmacotherapy (Medications Ordered): No orders of the defined types were placed in this encounter.  Orders:  No orders of the defined types were placed in this encounter.  Follow-up plan:   No follow-ups on file.     Interventional Therapies  Risk  Complexity Considerations:   Estimated body mass index is increased to 55.53 kg/m from 52.31 kg/m as calculated from the following:   Height as of this encounter: 5' 3.5" (1.613 m).   Weight as of this encounter: Increased to 313 pounds from 300 lb (142.2 kg). WNL   Planned  Pending:      Under consideration:   None at this time. The patient's body habitus makes any interventional therapy  technically challenging and decreases the chances of success. The patient has been informed that once her BMI goes down low 35, we will revisit and offer her other alternatives.   Completed:   None since joining the practice on 07/26/2018    Completed by other providers:   None at this time   Therapeutic  Palliative (PRN) options:   None established     Recent Visits Date Type Provider Dept  03/12/22 Office Visit Milinda Pointer, MD [facility]  Showing recent visits within past 90 days and meeting all other requirements Future Appointments Date Type Provider Dept  06/04/22 Appointment Milinda Pointer, MD [facility]  Showing future appointments within next 90 days and meeting all other requirements  I discussed the assessment and treatment plan with the patient. The patient was provided an opportunity to ask  questions and all were answered. The patient agreed with the plan and demonstrated an understanding of the instructions.  Patient advised to call back or seek an in-person evaluation if the symptoms or condition worsens.  Duration of encounter: *** minutes.  Total time on encounter, as per AMA guidelines included both the face-to-face and non-face-to-face time personally spent by the physician and/or other qualified health care professional(s) on the day of the encounter (includes time in activities that require the physician or other qualified health care professional and does not include time in activities normally performed by clinical staff). Physician's time may include the following activities when performed: Preparing to see the patient (e.g., pre-charting review of records, searching for previously ordered imaging, lab work, and nerve conduction tests) Review of prior analgesic pharmacotherapies. Reviewing PMP Interpreting ordered tests (e.g., lab work, imaging, nerve conduction tests) Performing post-procedure evaluations, including interpretation of diagnostic procedures Obtaining and/or reviewing separately obtained history Performing a medically appropriate examination and/or evaluation Counseling and educating the patient/family/caregiver Ordering medications, tests, or procedures Referring and communicating with other health care professionals (when not separately reported) Documenting clinical information in the electronic or other health record Independently interpreting results (not separately reported) and communicating results to the patient/ family/caregiver Care coordination (not separately reported)  Note by: Alison Cola, MD Date: 06/04/2022; Time: 11:47 AM

## 2022-06-04 ENCOUNTER — Encounter: Payer: Self-pay | Admitting: Student in an Organized Health Care Education/Training Program

## 2022-06-04 ENCOUNTER — Ambulatory Visit
Payer: Medicare PPO | Attending: Pain Medicine | Admitting: Student in an Organized Health Care Education/Training Program

## 2022-06-04 VITALS — BP 157/88 | HR 98 | Temp 99.3°F | Resp 22 | Ht 63.0 in | Wt 311.0 lb

## 2022-06-04 DIAGNOSIS — Z79891 Long term (current) use of opiate analgesic: Secondary | ICD-10-CM

## 2022-06-04 DIAGNOSIS — M25561 Pain in right knee: Secondary | ICD-10-CM

## 2022-06-04 DIAGNOSIS — M25562 Pain in left knee: Secondary | ICD-10-CM

## 2022-06-04 DIAGNOSIS — M25572 Pain in left ankle and joints of left foot: Secondary | ICD-10-CM

## 2022-06-04 DIAGNOSIS — M47816 Spondylosis without myelopathy or radiculopathy, lumbar region: Secondary | ICD-10-CM | POA: Diagnosis not present

## 2022-06-04 DIAGNOSIS — M4316 Spondylolisthesis, lumbar region: Secondary | ICD-10-CM

## 2022-06-04 DIAGNOSIS — G8929 Other chronic pain: Secondary | ICD-10-CM | POA: Insufficient documentation

## 2022-06-04 DIAGNOSIS — M545 Low back pain, unspecified: Secondary | ICD-10-CM | POA: Insufficient documentation

## 2022-06-04 DIAGNOSIS — Z79899 Other long term (current) drug therapy: Secondary | ICD-10-CM | POA: Diagnosis not present

## 2022-06-04 DIAGNOSIS — G894 Chronic pain syndrome: Secondary | ICD-10-CM

## 2022-06-04 DIAGNOSIS — M25571 Pain in right ankle and joints of right foot: Secondary | ICD-10-CM

## 2022-06-04 DIAGNOSIS — Z5189 Encounter for other specified aftercare: Secondary | ICD-10-CM | POA: Diagnosis not present

## 2022-06-04 DIAGNOSIS — M4310 Spondylolisthesis, site unspecified: Secondary | ICD-10-CM | POA: Diagnosis present

## 2022-06-04 MED ORDER — HYDROCODONE-ACETAMINOPHEN 10-325 MG PO TABS: 1.0000 | ORAL_TABLET | Freq: Four times a day (QID) | ORAL | 0 refills | Status: DC | PRN

## 2022-06-04 NOTE — Patient Instructions (Signed)

## 2022-06-04 NOTE — Progress Notes (Signed)
Nursing Pain Medication Assessment:  Safety precautions to be maintained throughout the outpatient stay will include: orient to surroundings, keep bed in low position, maintain call bell within reach at all times, provide assistance with transfer out of bed and ambulation.  Medication Inspection Compliance: Pill count conducted under aseptic conditions, in front of the patient. Neither the pills nor the bottle was removed from the patient's sight at any time. Once count was completed pills were immediately returned to the patient in their original bottle.  Medication: Hydrocodone/APAP Pill/Patch Count:  58 of 120 pills remain Pill/Patch Appearance: Markings consistent with prescribed medication Bottle Appearance: Standard pharmacy container. Clearly labeled. Filled Date: 01 / 29 / 2024 Last Medication intake:  Today

## 2022-06-04 NOTE — Progress Notes (Signed)
PROVIDER NOTE: Information contained herein reflects review and annotations entered in association with encounter. Interpretation of such information and data should be left to medically-trained personnel. Information provided to patient can be located elsewhere in the medical record under "Patient Instructions". Document created using STT-dictation technology, any transcriptional errors that may result from process are unintentional.    Patient: Alison Cox  Service Category: E/M  Provider: Gillis Santa, MD  DOB: [birth year]  DOS: 06/04/2022  Referring Provider: Lajean Manes, MD  MRN: [id number]  Specialty: Interventional Pain Management  PCP: Lajean Manes, MD  Type: Established Patient  Setting: Ambulatory outpatient    Location: Office  Delivery: Face-to-face     HPI  [name], a 67 y.o. year old female, is here today because of her Chronic pain syndrome [G89.4]. Alison Cox primary complain today is Back Pain and Knee Pain (bilateral) Last encounter: My last encounter with her was on 05/19/2022. 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 Pain Assessment: Severity of Chronic pain is reported as a 3 /10. Location: Back Lower/unknown. Onset: More than a month ago. Quality: Aching, Stabbing, Sharp. Timing: Constant.  Modifying factor(s): meds. Vitals:  height is 5' 3"$  (1.6 m) and weight is 311 lb (141.1 kg) (abnormal). Her temperature is 99.3 F (37.4 C). Her blood pressure is 157/88 (abnormal) and her pulse is 98. Her respiration is 22 (abnormal) and oxygen saturation is 97%.  BMI: Estimated body mass index is 55.09 kg/m as calculated from the following:   Height as of this encounter: 5' 3"$  (1.6 m).   Weight as of this encounter: 311 lb (141.1 kg).  Reason for encounter: medication management.   No change in medical history since last visit.  Patient's pain is at baseline.  Patient continues multimodal pain regimen as prescribed.  States that it provides pain relief and improvement in functional status. Tentative left corneal transplant at [facility] this summer S/p right corneal transplant  Pharmacotherapy Assessment  Analgesic: Hydrocodone/APAP 10/325 one tablet by mouth every 6 hours (total: 40 mg/day) MME/day: 40 mg/day.   Monitoring: [place] PMP: PDMP reviewed during this encounter.       Pharmacotherapy: No side-effects or adverse reactions reported. Compliance: No problems identified. Effectiveness: Clinically acceptable.  Alison Shorter, RN  06/04/2022  1:45 PM  Sign when Signing Visit Nursing Pain Medication Assessment:  Safety precautions to be maintained throughout the outpatient stay will include: orient to surroundings, keep bed in low position, maintain call bell within reach at all times, provide assistance with transfer out of bed and ambulation.  Medication Inspection Compliance: Pill count conducted under aseptic conditions, in front of the patient. Neither the pills nor the bottle was removed from the patient's sight at any time. Once count was completed pills were immediately returned to the patient in their original bottle.  Medication: Hydrocodone/APAP Pill/Patch Count:  58 of 120 pills remain Pill/Patch Appearance: Markings consistent with prescribed medication Bottle Appearance:  Standard pharmacy  container. Clearly labeled. Filled Date: 01 / 29 / 2024 Last Medication intake:  Today    No results found for: "CBDTHCR" No results found for: "D8THCCBX" No results found for: "D9THCCBX"  UDS:  Summary  Date Value Ref Range Status  02/12/2022 Note  Final    Comment:    ==================================================================== [facility] 13 (MW) ==================================================================== Test                             Result       Flag       Units  Drug Present and Declared for Prescription Verification   Hydrocodone                    >2551        EXPECTED   ng/mg creat   Hydromorphone                  45           EXPECTED   ng/mg creat   Dihydrocodeine                 736          EXPECTED   ng/mg creat   Norhydrocodone                 >1276        EXPECTED   ng/mg creat    Sources of hydrocodone include scheduled prescription medications.    Hydromorphone, dihydrocodeine and norhydrocodone are expected    metabolites of hydrocodone. Hydromorphone and dihydrocodeine are    also available as scheduled prescription medications.  ==================================================================== Test                      Result    Flag   Units      Ref Range   Creatinine              392              mg/dL      >=20 ==================================================================== Declared Medications:  The flagging and interpretation on this report are based on the  following declared medications.  Unexpected results may arise from  inaccuracies in the declared medications.   **Note: The testing scope of this panel includes these medications:   Hydrocodone (Norco)   **Note: The testing scope of this panel does not include the  following reported medications:   Acetaminophen (Norco)  Albuterol  Amlodipine  Calcium  Cholecalciferol  Ciclopirox  Doxycycline  Eye Drop  Fluocinolone  Furosemide  (Lasix)  Ketoconazole (Nizoral)  Levothyroxine  Losartan  Magnesium (Mag-Ox)  Meloxicam  Naloxone (Narcan)  Nystatin  Spironolactone  Topical  Triamcinolone ==================================================================== For clinical consultation, please call [phone]. ====================================================================       ROS  Constitutional: Denies any fever or chills Gastrointestinal: No reported hemesis, hematochezia, vomiting, or acute GI distress Musculoskeletal: LBP and bilateral knee pain Neurological: No reported episodes of acute onset apraxia, aphasia, dysarthria, agnosia, amnesia, paralysis, loss of coordination, or loss of consciousness  Medication Review  Cholecalciferol, Ciclopirox, Crisaborole, Difluprednate, Fluocinolone Acetonide, Fluocinolone Acetonide Body, HYDROcodone-acetaminophen, Ivermectin, Magnesium Oxide -Mg Supplement, albuterol, amLODipine, calcium carbonate, doxycycline, furosemide, ketoconazole, levothyroxine, losartan, meloxicam, naloxone, nystatin-triamcinolone, and spironolactone  History Review  Allergy: Ms. Przywara is allergic to penicillins, shellfish allergy, and sulfa antibiotics. Drug: Ms. Alexis  reports no history of drug use. Alcohol:  reports no history of  alcohol use. Tobacco:  reports that she has never smoked. She has never used smokeless tobacco. Social: Ms. Voeks  reports that she has never smoked. She has never used smokeless tobacco. She reports that she does not drink alcohol and does not use drugs. 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 Surgical: Ms. Nighswander  has a past surgical history that includes Endometrial ablation; DILATATION  AND CURETTAGE; Corneal transplant; and Corneal transplant. Family: family history includes Hypertension in her father and mother; Thyroid disease in her father.  Laboratory Chemistry Profile   Renal Lab Results  Component Value Date   BUN 15 02/12/2022   CREATININE 1.00 02/12/2022   BCR 15 02/12/2022   GFRAA 70 01/17/2019   GFRNONAA 61 01/17/2019    Hepatic Lab Results  Component Value Date   AST 27 02/12/2022   ALT 47 05/01/2016   ALBUMIN 4.8 02/12/2022   ALKPHOS 114 02/12/2022    Electrolytes Lab Results  Component Value Date   NA 141 02/12/2022   K 4.0 02/12/2022   CL 103 02/12/2022   CALCIUM 10.2 02/12/2022   MG 1.9 02/12/2022    Bone Lab Results  Component Value Date   25OHVITD1 30 02/12/2022   25OHVITD2 <1.0 02/12/2022   25OHVITD3 30 02/12/2022    Inflammation (CRP: Acute Phase) (ESR: Chronic Phase) Lab Results  Component Value Date   CRP 8 02/12/2022   ESRSEDRATE 22 02/12/2022         Note: Above Lab results reviewed.  Recent Imaging Review  DG HIP UNILAT W OR W/O PELVIS 2-3 VIEWS LEFT CLINICAL DATA:  Left hip pain/arthralgia. Chronic left hip pain.  EXAM: DG HIP (WITH OR WITHOUT PELVIS) 2-3V LEFT  COMPARISON:  None Available.  FINDINGS: There is bilateral hip joint space narrowing, left greater than right. Mild bilateral acetabular spurring, left greater than right. The femoral head is seated in the acetabulum. No evidence of fracture. Detailed joint space assessment is limited due to soft tissue attenuation from habitus, allowing for this no evidence of erosion or avascular necrosis. No evidence of bony destructive change or focal lesion. There is degenerative change of both sacroiliac joints. No sacroiliac joint or pubic symphyseal widening.  IMPRESSION: 1. Bilateral hip osteoarthritis, left greater than right. 2. Degenerative change of the sacroiliac joints.  Electronically Signed   By: Keith Rake M.D.   On: 02/20/2022 12:02 DG  Lumbar Spine Complete W/Bend CLINICAL DATA:  Lumbar spondylosis. Chronic bilateral low back pain without sciatica. Anterolisthesis.  EXAM: LUMBAR SPINE - COMPLETE WITH BENDING VIEWS  COMPARISON:  Lumbar radiograph 10/11/2011  FINDINGS: Soft tissue attenuation from habitus limits detailed assessment. There is subjective bony under mineralization. Five non-rib-bearing lumbar vertebra. There is trace anterolisthesis of L4 on L5 and trace retrolisthesis of L2 on L3 that is unchanged on flexion and extension, however there is limited range of motion. Disc space narrowing with spurring at L2-L3, L3-L4, L4-L5 and L5-S1, progression from 2013 exam. Moderate facet hypertrophy from L2-L3 through the lumbosacral junction, also progressed. Normal vertebral body heights. No evidence of fracture compression deformity. No obvious bony destructive change or focal lesion.  IMPRESSION: 1. Multilevel degenerative disc disease and facet hypertrophy of the lumbar spine, progression from 2013 exam. 2. Trace anterolisthesis of L4 on L5 and trace retrolisthesis of L2 on L3, unchanged  on flexion and extension.  Electronically Signed   By: Keith Rake M.D.   On: 02/20/2022 12:01 DG Ankle Complete Left CLINICAL DATA:  Left ankle pain. Chronic pain of both ankles.  EXAM: LEFT ANKLE COMPLETE - 3+ VIEW  COMPARISON:  None Available.  FINDINGS: Normal alignment. The ankle mortise is preserved. Cystic change in the medial talar dome. There is tibial talar spurring. Chronic corticated densities adjacent to the medial malleolus. Fragmented plantar calcaneal spur and small Achilles tendon enthesophyte. No acute fracture. No erosive change or bony destruction. No ankle joint effusion.  IMPRESSION: 1. Mild osteoarthritis of the ankle joint. Cystic change in the medial talar dome may be degenerative or secondary to osteochondral lesion. 2. Fragmented plantar calcaneal spur and small Achilles  tendon enthesophyte.  Electronically Signed   By: Keith Rake M.D.   On: 02/20/2022 11:59 DG Ankle Complete Right CLINICAL DATA:  Chronic pain of both ankles. Right ankle pain.  EXAM: RIGHT ANKLE - COMPLETE 3+ VIEW  COMPARISON:  None Available.  FINDINGS: Normal alignment. Ankle mortise is preserved. Cystic change in the medial talar dome may be related to degenerative change or osteochondral defect. There is tibial talar spurring. No acute or evidence of prior fracture. Chronic corticated densities adjacent to the medial malleolus. There is a small plantar calcaneal spur and Achilles tendon enthesophyte. No significant ankle joint effusion. No erosive change or bony destruction.  IMPRESSION: 1. Tibiotalar osteoarthritis. Cystic change in the medial talar dome may be related to degenerative change or osteochondral defect. 2. Small plantar calcaneal spur and Achilles tendon enthesophyte.  Electronically Signed   By: Keith Rake M.D.   On: 02/20/2022 11:57 DG Knee Complete 4 Views Left CLINICAL DATA:  Left knee pain/arthralgia. Chronic bilateral knee pain.  EXAM: LEFT KNEE - COMPLETE 4+ VIEW  COMPARISON:  None Available.  FINDINGS: The bones are subjectively under mineralized. Moderate medial tibiofemoral joint space narrowing. Subchondral cystic change in the medial tibiofemoral compartment. Moderate tricompartmental peripheral spurring. No acute or evidence of prior fracture. No erosive change, bony destruction or focal bone lesions. Small knee joint effusion. Minimal soft tissue edema.  IMPRESSION: Tricompartmental osteoarthritis, moderate in the medial tibiofemoral compartment. Small knee joint effusion.  Electronically Signed   By: Keith Rake M.D.   On: 02/20/2022 11:56 DG Knee Complete 4 Views Right CLINICAL DATA:  Chronic bilateral knee pain. Right knee pain/arthralgia.  EXAM: RIGHT KNEE - COMPLETE 4+ VIEW  COMPARISON:  None  Available.  FINDINGS: The bones are subjectively under mineralized. There is moderate medial tibiofemoral joint space narrowing. Subchondral cystic change in the medial tibiofemoral compartment. Moderate tricompartmental peripheral spurring. Ossified densities posteriorly may represent ossified intra-articular bodies, 1 May represent a fabella. There is an ossified intra-articular body in the suprapatellar joint space. No significant knee joint effusion. No acute or evidence of prior fracture. No erosive change or bone destruction.  IMPRESSION: 1. Tricompartmental osteoarthritis, moderate in the medial tibiofemoral compartment. 2. Ossified intra-articular body in the suprapatellar joint space, as well as suspected posterior intra-articular bodies.  Electronically Signed   By: Keith Rake M.D.   On: 02/20/2022 11:51 Note: Reviewed        Physical Exam  General appearance: Well nourished, well developed, and well hydrated. In no apparent acute distress Mental status: Alert, oriented x 3 (person, place, & time)       Respiratory: No evidence of acute respiratory distress Eyes: PERLA Vitals: BP (!) 157/88   Pulse 98   Temp 99.3  F (37.4 C)   Resp (!) 22   Ht 5' 3"$  (1.6 m)   Wt (!) 311 lb (141.1 kg)   SpO2 97%   BMI 55.09 kg/m  BMI: Estimated body mass index is 55.09 kg/m as calculated from the following:   Height as of this encounter: 5' 3"$  (1.6 m).   Weight as of this encounter: 311 lb (141.1 kg). Ideal: Ideal body weight: 52.4 kg (115 lb 8.3 oz) Adjusted ideal body weight: 87.9 kg (193 lb 11.4 oz)  +low back pain Pain with lumbar facet loading Bilateral knee pain  Assessment   Diagnosis Status  1. Chronic pain syndrome   2. Chronic knee pain (1ry area of Pain) (Bilateral) (R>L)   3. Chronic ankle pain (2ry area of Pain) (Bilateral) (L>R)   4. Pharmacologic therapy   5. Chronic use of opiate for therapeutic purpose   6. Encounter for medication management    7. Encounter for therapeutic procedure   8. Lumbar spondylosis   9. Grade 1 Anterolisthesis of L4 over L5   10. Chronic low back pain (3ry area of Pain) (midline)    Controlled Controlled Controlled   Updated Problems: No problems updated.  Plan of Care  Problem-specific:  No problem-specific Assessment & Plan notes found for this encounter.  Ms. FAYETTE MONZINGO has a current medication list which includes the following long-term medication(s): albuterol, amlodipine, calcium carbonate, furosemide, levothyroxine, losartan, cholecalciferol, [START ON 06/18/2022] hydrocodone-acetaminophen, [START ON 07/18/2022] hydrocodone-acetaminophen, [START ON 08/17/2022] hydrocodone-acetaminophen, magnesium oxide -mg supplement, meloxicam, and spironolactone.  Pharmacotherapy (Medications Ordered): Meds ordered this encounter  Medications   HYDROcodone-acetaminophen (NORCO) 10-325 MG tablet    Sig: Take 1 tablet by mouth every 6 (six) hours as needed for severe pain. Must last 30 days    Dispense:  120 tablet    Refill:  0    Chronic Pain: STOP Act (Not applicable) Fill 1 day early if closed on refill date.   HYDROcodone-acetaminophen (NORCO) 10-325 MG tablet    Sig: Take 1 tablet by mouth every 6 (six) hours as needed for severe pain. Must last 30 days    Dispense:  120 tablet    Refill:  0    Chronic Pain: STOP Act (Not applicable) Fill 1 day early if closed on refill date.   HYDROcodone-acetaminophen (NORCO) 10-325 MG tablet    Sig: Take 1 tablet by mouth every 6 (six) hours as needed for severe pain. Must last 30 days    Dispense:  120 tablet    Refill:  0    Chronic Pain: STOP Act (Not applicable) Fill 1 day early if closed on refill date.   Orders:  No orders of the defined types were placed in this encounter.  Follow-up plan:   Return in about 3 months (around 09/16/2022) for Medication Management, in person.     Interventional Therapies  Risk  Complexity Considerations:   Estimated  body mass index is increased to 55.53 kg/m from 52.31 kg/m as calculated from the following:   Height as of this encounter: 5' 3.5" (1.613 m).   Weight as of this encounter: Increased to 313 pounds from 300 lb (142.2 kg). WNL   Planned  Pending:      Under consideration:   None at this time. The patient's body habitus makes any interventional therapy technically challenging and decreases the chances of success. The patient has been informed that once her BMI goes down low 35, we will revisit and offer her other  alternatives.   Completed:   None since joining the practice on 07/26/2018    Completed by other providers:   None at this time   Therapeutic  Palliative (PRN) options:   None established     Recent Visits Date Type Provider Dept  03/12/22 Office Visit Milinda Pointer, MD [facility]  Showing recent visits within past 90 days and meeting all other requirements Today's Visits Date Type Provider Dept  06/04/22 Office Visit Gillis Santa, MD [facility]  Showing today's visits and meeting all other requirements Future Appointments No visits were found meeting these conditions. Showing future appointments within next 90 days and meeting all other requirements  I discussed the assessment and treatment plan with the patient. The patient was provided an opportunity to ask questions and all were answered. The patient agreed with the plan and demonstrated an understanding of the instructions.  Patient advised to call back or seek an in-person evaluation if the symptoms or condition worsens.  Duration of encounter: 13mnutes.  Total time on encounter, as per AMA guidelines included both the face-to-face and non-face-to-face time personally spent by the physician and/or other qualified health care professional(s) on the day of the encounter (includes time in activities that require the physician or other qualified health care professional and does not  include time in activities normally performed by clinical staff). Physician's time may include the following activities when performed: Preparing to see the patient (e.g., pre-charting review of records, searching for previously ordered imaging, lab work, and nerve conduction tests) Review of prior analgesic pharmacotherapies. Reviewing PMP Interpreting ordered tests (e.g., lab work, imaging, nerve conduction tests) Performing post-procedure evaluations, including interpretation of diagnostic procedures Obtaining and/or reviewing separately obtained history Performing a medically appropriate examination and/or evaluation Counseling and educating the patient/family/caregiver Ordering medications, tests, or procedures Referring and communicating with other health care professionals (when not separately reported) Documenting clinical information in the electronic or other health record Independently interpreting results (not separately reported) and communicating results to the patient/ family/caregiver Care coordination (not separately reported)  Note by: BGillis Santa MD Date: 06/04/2022; Time: 2:22 PM

## 2022-06-06 DIAGNOSIS — J069 Acute upper respiratory infection, unspecified: Secondary | ICD-10-CM | POA: Diagnosis not present

## 2022-06-06 DIAGNOSIS — J209 Acute bronchitis, unspecified: Secondary | ICD-10-CM | POA: Diagnosis not present

## 2022-06-06 DIAGNOSIS — Z6841 Body Mass Index (BMI) 40.0 and over, adult: Secondary | ICD-10-CM | POA: Diagnosis not present

## 2022-06-06 DIAGNOSIS — Z87891 Personal history of nicotine dependence: Secondary | ICD-10-CM | POA: Diagnosis not present

## 2022-06-06 DIAGNOSIS — Z03818 Encounter for observation for suspected exposure to other biological agents ruled out: Secondary | ICD-10-CM | POA: Diagnosis not present

## 2022-06-06 DIAGNOSIS — B3731 Acute candidiasis of vulva and vagina: Secondary | ICD-10-CM | POA: Diagnosis not present

## 2022-06-06 DIAGNOSIS — R300 Dysuria: Secondary | ICD-10-CM | POA: Diagnosis not present

## 2022-07-09 ENCOUNTER — Ambulatory Visit
Admission: RE | Admit: 2022-07-09 | Discharge: 2022-07-09 | Disposition: A | Discharge status: Home / Self Care | Payer: Medicare PPO | Source: Ambulatory Visit | Attending: Internal Medicine | Admitting: Internal Medicine

## 2022-07-09 DIAGNOSIS — E041 Nontoxic single thyroid nodule: Secondary | ICD-10-CM | POA: Diagnosis not present

## 2022-09-11 ENCOUNTER — Ambulatory Visit
Payer: Medicare PPO | Attending: Student in an Organized Health Care Education/Training Program | Admitting: Student in an Organized Health Care Education/Training Program

## 2022-09-11 ENCOUNTER — Encounter: Payer: Self-pay | Admitting: Student in an Organized Health Care Education/Training Program

## 2022-09-11 DIAGNOSIS — M25562 Pain in left knee: Secondary | ICD-10-CM | POA: Insufficient documentation

## 2022-09-11 DIAGNOSIS — Z79891 Long term (current) use of opiate analgesic: Secondary | ICD-10-CM | POA: Insufficient documentation

## 2022-09-11 DIAGNOSIS — M47816 Spondylosis without myelopathy or radiculopathy, lumbar region: Secondary | ICD-10-CM

## 2022-09-11 DIAGNOSIS — M25571 Pain in right ankle and joints of right foot: Secondary | ICD-10-CM | POA: Diagnosis not present

## 2022-09-11 DIAGNOSIS — M25561 Pain in right knee: Secondary | ICD-10-CM | POA: Insufficient documentation

## 2022-09-11 DIAGNOSIS — G894 Chronic pain syndrome: Secondary | ICD-10-CM | POA: Diagnosis not present

## 2022-09-11 DIAGNOSIS — M4310 Spondylolisthesis, site unspecified: Secondary | ICD-10-CM

## 2022-09-11 DIAGNOSIS — M4316 Spondylolisthesis, lumbar region: Secondary | ICD-10-CM

## 2022-09-11 DIAGNOSIS — Z79899 Other long term (current) drug therapy: Secondary | ICD-10-CM | POA: Diagnosis not present

## 2022-09-11 DIAGNOSIS — G8929 Other chronic pain: Secondary | ICD-10-CM | POA: Diagnosis not present

## 2022-09-11 DIAGNOSIS — M25572 Pain in left ankle and joints of left foot: Secondary | ICD-10-CM | POA: Diagnosis not present

## 2022-09-11 DIAGNOSIS — Z5189 Encounter for other specified aftercare: Secondary | ICD-10-CM

## 2022-09-11 DIAGNOSIS — M545 Low back pain, unspecified: Secondary | ICD-10-CM | POA: Insufficient documentation

## 2022-09-11 MED ORDER — HYDROCODONE-ACETAMINOPHEN 10-325 MG PO TABS: 1.0000 | ORAL_TABLET | Freq: Four times a day (QID) | ORAL | 0 refills | Status: DC | PRN

## 2022-09-11 NOTE — Progress Notes (Signed)
Safety precautions to be maintained throughout the outpatient stay will include: orient to surroundings, keep bed in low position, maintain call bell within reach at all times, provide assistance with transfer out of bed and ambulation.   Nursing Pain Medication Assessment:  Safety precautions to be maintained throughout the outpatient stay will include: orient to surroundings, keep bed in low position, maintain call bell within reach at all times, provide assistance with transfer out of bed and ambulation.  Medication Inspection Compliance: Pill count conducted under aseptic conditions, in front of the patient. Neither the pills nor the bottle was removed from the patient's sight at any time. Once count was completed pills were immediately returned to the patient in their original bottle.  Medication: Hydrocodone/APAP Pill/Patch Count:  26 of 120 pills remain Pill/Patch Appearance: Markings consistent with prescribed medication Bottle Appearance: Standard pharmacy container. Clearly labeled. Filled Date: 04 / 26 / 2024 Last Medication intake:  Today

## 2022-09-11 NOTE — Progress Notes (Signed)
PROVIDER NOTE: Information contained herein reflects review and annotations entered in association with encounter. Interpretation of such information and data should be left to medically-trained personnel. Information provided to patient can be located elsewhere in the medical record under "Patient Instructions". Document created using STT-dictation technology, any transcriptional errors that may result from process are unintentional.    Patient: Alison Cox  Service Category: E/M  Provider: Edward Jolly, MD  DOB: [birth year]  DOS: 06/04/2022  Referring Provider: Merlene Laughter, MD  MRN: [id number]  Specialty: Interventional Pain Management  PCP: Merlene Laughter, MD  Type: Established Patient  Setting: Ambulatory outpatient    Location: Office  Delivery: Face-to-face     HPI  [name], a 67 y.o. year old female, is here today because of her Chronic pain syndrome [G89.4]. Ms. Wildt primary complain today is Back Pain and Knee Pain (bilateral) Last encounter: My last encounter with her was on 06/04/22 Pertinent problems: Ms. 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 Pain Assessment: Severity of Chronic pain is reported as a 3 /10. Location: Back Lower/unknown. Onset: More than a month ago. Quality: Aching, Stabbing, Sharp. Timing: Constant.  Modifying factor(s): meds. Vitals:  height is 5\' 3"  (1.6 m) and weight is 311 lb (141.1 kg) (abnormal). Her temperature is 99.3 F (37.4 C). Her blood pressure is 157/88 (abnormal) and her pulse is 98. Her respiration is 22 (abnormal) and oxygen saturation is 97%.  BMI: Estimated body mass index is 55.09 kg/m as calculated from the following:   Height as of this encounter: 5\' 3"  (1.6 m).   Weight as of this encounter: 311 lb (141.1 kg).  Reason for encounter: medication management.   No change in medical history since last visit.  Patient's pain is at baseline.  Patient continues multimodal pain regimen as prescribed.  States that it provides pain relief and improvement in functional status. 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   Monitoring: [place] PMP: PDMP reviewed during this encounter.       Pharmacotherapy: No side-effects or adverse reactions reported. Compliance: No problems identified. Effectiveness: Clinically acceptable.  Valerie Salts, RN  06/04/2022  1:45 PM  Sign when Signing Visit Nursing Pain Medication Assessment:  Safety precautions to be maintained throughout the outpatient stay will include: orient to surroundings, keep bed in low position, maintain call bell within reach at all times, provide assistance with transfer out of bed and ambulation.  Medication Inspection Compliance: Pill count conducted under aseptic conditions, in front of the patient. Neither the pills nor the bottle was removed from the patient's sight at any time. Once count was completed pills were immediately returned to the patient in their original bottle.  Medication: Hydrocodone/APAP Pill/Patch Count:  58 of 120 pills remain Pill/Patch Appearance: Markings consistent with prescribed medication Bottle Appearance:  Standard pharmacy container. Clearly labeled. Filled Date: 01 / 29 / 2024 Last Medication intake:  Today    No results found for: "CBDTHCR" No results found for: "D8THCCBX" No results found for: "D9THCCBX"  UDS:  Summary  Date Value Ref Range Status  02/12/2022 Note  Final    Comment:    ==================================================================== [facility] Select 13 (MW) ==================================================================== Test                             Result       Flag       Units  Drug Present and Declared for Prescription Verification   Hydrocodone                    >2551        EXPECTED   ng/mg creat   Hydromorphone                  45           EXPECTED   ng/mg creat   Dihydrocodeine                 736          EXPECTED   ng/mg creat   Norhydrocodone                 >1276        EXPECTED   ng/mg creat    Sources of hydrocodone include scheduled prescription medications.    Hydromorphone, dihydrocodeine and norhydrocodone are expected    metabolites of hydrocodone. Hydromorphone and dihydrocodeine are    also available as scheduled prescription medications.  ==================================================================== Test                      Result    Flag   Units      Ref Range   Creatinine              392              mg/dL      >=16 ==================================================================== Declared Medications:  The flagging and interpretation on this report are based on the  following declared medications.  Unexpected results may arise from  inaccuracies in the declared medications.   **Note: The testing scope of this panel includes these medications:   Hydrocodone (Norco)   **Note: The testing scope of this panel does not include the  following reported medications:   Acetaminophen (Norco)  Albuterol  Amlodipine  Calcium  Cholecalciferol  Ciclopirox  Doxycycline  Eye Drop  Fluocinolone  Furosemide  (Lasix)  Ketoconazole (Nizoral)  Levothyroxine  Losartan  Magnesium (Mag-Ox)  Meloxicam  Naloxone (Narcan)  Nystatin  Spironolactone  Topical  Triamcinolone ==================================================================== For clinical consultation, please call [phone]. ====================================================================       ROS  Constitutional: Denies any fever or chills Gastrointestinal: No reported hemesis, hematochezia, vomiting, or acute GI distress Musculoskeletal: LBP and bilateral knee pain Neurological: No reported episodes of acute onset apraxia, aphasia, dysarthria, agnosia, amnesia, paralysis, loss of coordination, or loss of consciousness  Medication Review  Cholecalciferol, Ciclopirox, Crisaborole, Difluprednate, Fluocinolone Acetonide, Fluocinolone Acetonide Body, HYDROcodone-acetaminophen, Ivermectin, Magnesium Oxide -Mg Supplement, albuterol, amLODipine, calcium carbonate, doxycycline, furosemide, ketoconazole, levothyroxine, losartan, meloxicam, naloxone, nystatin-triamcinolone, and spironolactone  History Review  Allergy: Ms. Arrasmith is allergic to penicillins, shellfish allergy, and sulfa antibiotics. Drug: Ms. Miskiewicz  reports no history of drug use. Alcohol:  reports no  history of alcohol use. Tobacco:  reports that she has never smoked. She has never used smokeless tobacco. Social: Ms. Reiner  reports that she has never smoked. She has never used smokeless tobacco. She reports that she does not drink alcohol and does not use drugs. 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 Surgical: Ms. Cokley  has a past surgical history that includes Endometrial ablation; DILATATION  AND CURETTAGE; Corneal transplant; and Corneal transplant. Family: family history includes Hypertension in her father and mother; Thyroid disease in her father.  Laboratory Chemistry Profile   Renal Lab Results  Component Value Date   BUN 15 02/12/2022   CREATININE 1.00 02/12/2022   BCR 15 02/12/2022   GFRAA 70 01/17/2019   GFRNONAA 61 01/17/2019    Hepatic Lab Results  Component Value Date   AST 27 02/12/2022   ALT 47 05/01/2016   ALBUMIN 4.8 02/12/2022   ALKPHOS 114 02/12/2022    Electrolytes Lab Results  Component Value Date   NA 141 02/12/2022   K 4.0 02/12/2022   CL 103 02/12/2022   CALCIUM 10.2 02/12/2022   MG 1.9 02/12/2022    Bone Lab Results  Component Value Date   25OHVITD1 30 02/12/2022   25OHVITD2 <1.0 02/12/2022   25OHVITD3 30 02/12/2022    Inflammation (CRP: Acute Phase) (ESR: Chronic Phase) Lab Results  Component Value Date   CRP 8 02/12/2022   ESRSEDRATE 22 02/12/2022         Note: Above Lab results reviewed.  Recent Imaging Review  DG HIP UNILAT W OR W/O PELVIS 2-3 VIEWS LEFT CLINICAL DATA:  Left hip pain/arthralgia. Chronic left hip pain.  EXAM: DG HIP (WITH OR WITHOUT PELVIS) 2-3V LEFT  COMPARISON:  None Available.  FINDINGS: There is bilateral hip joint space narrowing, left greater than right. Mild bilateral acetabular spurring, left greater than right. The femoral head is seated in the acetabulum. No evidence of fracture. Detailed joint space assessment is limited due to soft tissue attenuation from habitus, allowing for this no evidence of erosion or avascular necrosis. No evidence of bony destructive change or focal lesion. There is degenerative change of both sacroiliac joints. No sacroiliac joint or pubic symphyseal widening.  IMPRESSION: 1. Bilateral hip osteoarthritis, left greater than right. 2. Degenerative change of the sacroiliac joints.  Electronically Signed   By: Narda Rutherford M.D.   On: 02/20/2022 12:02 DG  Lumbar Spine Complete W/Bend CLINICAL DATA:  Lumbar spondylosis. Chronic bilateral low back pain without sciatica. Anterolisthesis.  EXAM: LUMBAR SPINE - COMPLETE WITH BENDING VIEWS  COMPARISON:  Lumbar radiograph 10/11/2011  FINDINGS: Soft tissue attenuation from habitus limits detailed assessment. There is subjective bony under mineralization. Five non-rib-bearing lumbar vertebra. There is trace anterolisthesis of L4 on L5 and trace retrolisthesis of L2 on L3 that is unchanged on flexion and extension, however there is limited range of motion. Disc space narrowing with spurring at L2-L3, L3-L4, L4-L5 and L5-S1, progression from 2013 exam. Moderate facet hypertrophy from L2-L3 through the lumbosacral junction, also progressed. Normal vertebral body heights. No evidence of fracture compression deformity. No obvious bony destructive change or focal lesion.  IMPRESSION: 1. Multilevel degenerative disc disease and facet hypertrophy of the lumbar spine, progression from 2013 exam. 2. Trace anterolisthesis of L4 on L5 and trace retrolisthesis of L2 on  L3, unchanged on flexion and extension.  Electronically Signed   By: Narda Rutherford M.D.   On: 02/20/2022 12:01 DG Ankle Complete Left CLINICAL DATA:  Left ankle pain. Chronic pain of both ankles.  EXAM: LEFT ANKLE COMPLETE - 3+ VIEW  COMPARISON:  None Available.  FINDINGS: Normal alignment. The ankle mortise is preserved. Cystic change in the medial talar dome. There is tibial talar spurring. Chronic corticated densities adjacent to the medial malleolus. Fragmented plantar calcaneal spur and small Achilles tendon enthesophyte. No acute fracture. No erosive change or bony destruction. No ankle joint effusion.  IMPRESSION: 1. Mild osteoarthritis of the ankle joint. Cystic change in the medial talar dome may be degenerative or secondary to osteochondral lesion. 2. Fragmented plantar calcaneal spur and small Achilles  tendon enthesophyte.  Electronically Signed   By: Narda Rutherford M.D.   On: 02/20/2022 11:59 DG Ankle Complete Right CLINICAL DATA:  Chronic pain of both ankles. Right ankle pain.  EXAM: RIGHT ANKLE - COMPLETE 3+ VIEW  COMPARISON:  None Available.  FINDINGS: Normal alignment. Ankle mortise is preserved. Cystic change in the medial talar dome may be related to degenerative change or osteochondral defect. There is tibial talar spurring. No acute or evidence of prior fracture. Chronic corticated densities adjacent to the medial malleolus. There is a small plantar calcaneal spur and Achilles tendon enthesophyte. No significant ankle joint effusion. No erosive change or bony destruction.  IMPRESSION: 1. Tibiotalar osteoarthritis. Cystic change in the medial talar dome may be related to degenerative change or osteochondral defect. 2. Small plantar calcaneal spur and Achilles tendon enthesophyte.  Electronically Signed   By: Narda Rutherford M.D.   On: 02/20/2022 11:57 DG Knee Complete 4 Views Left CLINICAL DATA:  Left knee pain/arthralgia. Chronic bilateral knee pain.  EXAM: LEFT KNEE - COMPLETE 4+ VIEW  COMPARISON:  None Available.  FINDINGS: The bones are subjectively under mineralized. Moderate medial tibiofemoral joint space narrowing. Subchondral cystic change in the medial tibiofemoral compartment. Moderate tricompartmental peripheral spurring. No acute or evidence of prior fracture. No erosive change, bony destruction or focal bone lesions. Small knee joint effusion. Minimal soft tissue edema.  IMPRESSION: Tricompartmental osteoarthritis, moderate in the medial tibiofemoral compartment. Small knee joint effusion.  Electronically Signed   By: Narda Rutherford M.D.   On: 02/20/2022 11:56 DG Knee Complete 4 Views Right CLINICAL DATA:  Chronic bilateral knee pain. Right knee pain/arthralgia.  EXAM: RIGHT KNEE - COMPLETE 4+ VIEW  COMPARISON:  None  Available.  FINDINGS: The bones are subjectively under mineralized. There is moderate medial tibiofemoral joint space narrowing. Subchondral cystic change in the medial tibiofemoral compartment. Moderate tricompartmental peripheral spurring. Ossified densities posteriorly may represent ossified intra-articular bodies, 1 May represent a fabella. There is an ossified intra-articular body in the suprapatellar joint space. No significant knee joint effusion. No acute or evidence of prior fracture. No erosive change or bone destruction.  IMPRESSION: 1. Tricompartmental osteoarthritis, moderate in the medial tibiofemoral compartment. 2. Ossified intra-articular body in the suprapatellar joint space, as well as suspected posterior intra-articular bodies.  Electronically Signed   By: Narda Rutherford M.D.   On: 02/20/2022 11:51 Note: Reviewed        Physical Exam  General appearance: Well nourished, well developed, and well hydrated. In no apparent acute distress Mental status: Alert, oriented x 3 (person, place, & time)       Respiratory: No evidence of acute respiratory distress Eyes: PERLA Vitals: BP (!) 157/88   Pulse 98  Temp 99.3 F (37.4 C)   Resp (!) 22   Ht 5\' 3"  (1.6 m)   Wt (!) 311 lb (141.1 kg)   SpO2 97%   BMI 55.09 kg/m  BMI: Estimated body mass index is 55.09 kg/m as calculated from the following:   Height as of this encounter: 5\' 3"  (1.6 m).   Weight as of this encounter: 311 lb (141.1 kg). Ideal: Ideal body weight: 52.4 kg (115 lb 8.3 oz) Adjusted ideal body weight: 87.9 kg (193 lb 11.4 oz)  +low back pain Bilateral knee pain  Assessment   Diagnosis Status  1. Chronic pain syndrome   2. Chronic knee pain (1ry area of Pain) (Bilateral) (R>L)   3. Chronic ankle pain (2ry area of Pain) (Bilateral) (L>R)   4. Pharmacologic therapy   5. Chronic use of opiate for therapeutic purpose   6. Encounter for medication management   7. Encounter for therapeutic  procedure   8. Lumbar spondylosis   9. Grade 1 Anterolisthesis of L4 over L5   10. Chronic low back pain (3ry area of Pain) (midline)    Controlled Controlled Controlled    Plan of Care   Requested Prescriptions   Signed Prescriptions Disp Refills   HYDROcodone-acetaminophen (NORCO) 10-325 MG tablet 120 tablet 0    Sig: Take 1 tablet by mouth every 6 (six) hours as needed for severe pain. Must last 30 days   HYDROcodone-acetaminophen (NORCO) 10-325 MG tablet 120 tablet 0    Sig: Take 1 tablet by mouth every 6 (six) hours as needed for severe pain. Must last 30 days   HYDROcodone-acetaminophen (NORCO) 10-325 MG tablet 120 tablet 0    Sig: Take 1 tablet by mouth every 6 (six) hours as needed for severe pain. Must last 30 days   Continue Mobic PRN   Follow-up plan:   Return in about 3 months (around 09/16/2022) for Medication Management, in person.     Interventional Therapies  Risk  Complexity Considerations:   Estimated body mass index is increased to 55.53 kg/m from 52.31 kg/m as calculated from the following:   Height as of this encounter: 5' 3.5" (1.613 m).   Weight as of this encounter: Increased to 313 pounds from 300 lb (142.2 kg). WNL   Planned  Pending:      Under consideration:   None at this time. The patient's body habitus makes any interventional therapy technically challenging and decreases the chances of success. The patient has been informed that once her BMI goes down low 35, we will revisit and offer her other alternatives.   Completed:   None since joining the practice on 07/26/2018    Completed by other providers:   None at this time   Therapeutic  Palliative (PRN) options:   None established     Recent Visits Date Type Provider Dept  03/12/22 Office Visit Delano Metz, MD [facility]  Showing recent visits within past 90 days and meeting all other requirements Today's Visits Date Type Provider Dept  06/04/22 Office Visit  Edward Jolly, MD [facility]  Showing today's visits and meeting all other requirements Future Appointments No visits were found meeting these conditions. Showing future appointments within next 90 days and meeting all other requirements  I discussed the assessment and treatment plan with the patient. The patient was provided an opportunity to ask questions and all were answered. The patient agreed with the plan and demonstrated an understanding of the instructions.  Patient advised to  call back or seek an in-person evaluation if the symptoms or condition worsens.  Duration of encounter: .  Total time on encounter, as per AMA guidelines included both the face-to-face and non-face-to-face time personally spent by the physician and/or other qualified health care professional(s) on the day of the encounter (includes time in activities that require the physician or other qualified health care professional and does not include time in activities normally performed by clinical staff). Physician's time may include the following activities when performed: Preparing to see the patient (e.g., pre-charting review of records, searching for previously ordered imaging, lab work, and nerve conduction tests) Review of prior analgesic pharmacotherapies. Reviewing PMP Interpreting ordered tests (e.g., lab work, imaging, nerve conduction tests) Performing post-procedure evaluations, including interpretation of diagnostic procedures Obtaining and/or reviewing separately obtained history Performing a medically appropriate examination and/or evaluation Counseling and educating the patient/family/caregiver Ordering medications, tests, or procedures Referring and communicating with other health care professionals (when not separately reported) Documenting clinical information in the electronic or other health record Independently interpreting results (not separately reported) and communicating  results to the patient/ family/caregiver Care coordination (not separately reported)  Note by: Edward Jolly, MD Date: 06/04/2022; Time: 2:22 PM

## 2022-12-10 DIAGNOSIS — R600 Localized edema: Secondary | ICD-10-CM | POA: Diagnosis not present

## 2022-12-10 DIAGNOSIS — E038 Other specified hypothyroidism: Secondary | ICD-10-CM | POA: Diagnosis not present

## 2022-12-10 DIAGNOSIS — Z79899 Other long term (current) drug therapy: Secondary | ICD-10-CM | POA: Diagnosis not present

## 2022-12-10 DIAGNOSIS — Z6841 Body Mass Index (BMI) 40.0 and over, adult: Secondary | ICD-10-CM | POA: Diagnosis not present

## 2022-12-10 DIAGNOSIS — E782 Mixed hyperlipidemia: Secondary | ICD-10-CM | POA: Diagnosis not present

## 2022-12-10 DIAGNOSIS — I10 Essential (primary) hypertension: Secondary | ICD-10-CM | POA: Diagnosis not present

## 2022-12-10 DIAGNOSIS — Z0000 Encounter for general adult medical examination without abnormal findings: Secondary | ICD-10-CM | POA: Diagnosis not present

## 2022-12-10 DIAGNOSIS — R32 Unspecified urinary incontinence: Secondary | ICD-10-CM | POA: Diagnosis not present

## 2022-12-11 ENCOUNTER — Encounter: Payer: Self-pay | Admitting: Student in an Organized Health Care Education/Training Program

## 2022-12-11 ENCOUNTER — Ambulatory Visit
Payer: Medicare PPO | Attending: Student in an Organized Health Care Education/Training Program | Admitting: Student in an Organized Health Care Education/Training Program

## 2022-12-11 DIAGNOSIS — M25561 Pain in right knee: Secondary | ICD-10-CM | POA: Diagnosis not present

## 2022-12-11 DIAGNOSIS — M25572 Pain in left ankle and joints of left foot: Secondary | ICD-10-CM

## 2022-12-11 DIAGNOSIS — M545 Low back pain, unspecified: Secondary | ICD-10-CM | POA: Diagnosis present

## 2022-12-11 DIAGNOSIS — G894 Chronic pain syndrome: Secondary | ICD-10-CM | POA: Diagnosis not present

## 2022-12-11 DIAGNOSIS — Z5189 Encounter for other specified aftercare: Secondary | ICD-10-CM | POA: Diagnosis not present

## 2022-12-11 DIAGNOSIS — M25571 Pain in right ankle and joints of right foot: Secondary | ICD-10-CM | POA: Diagnosis not present

## 2022-12-11 DIAGNOSIS — Z79891 Long term (current) use of opiate analgesic: Secondary | ICD-10-CM

## 2022-12-11 DIAGNOSIS — M47816 Spondylosis without myelopathy or radiculopathy, lumbar region: Secondary | ICD-10-CM

## 2022-12-11 DIAGNOSIS — Z79899 Other long term (current) drug therapy: Secondary | ICD-10-CM | POA: Diagnosis not present

## 2022-12-11 DIAGNOSIS — M4310 Spondylolisthesis, site unspecified: Secondary | ICD-10-CM | POA: Diagnosis not present

## 2022-12-11 DIAGNOSIS — M4316 Spondylolisthesis, lumbar region: Secondary | ICD-10-CM

## 2022-12-11 DIAGNOSIS — G8929 Other chronic pain: Secondary | ICD-10-CM | POA: Insufficient documentation

## 2022-12-11 DIAGNOSIS — M25562 Pain in left knee: Secondary | ICD-10-CM | POA: Diagnosis not present

## 2022-12-11 MED ORDER — HYDROCODONE-ACETAMINOPHEN 10-325 MG PO TABS: 1.0000 | ORAL_TABLET | Freq: Four times a day (QID) | ORAL | 0 refills | Status: DC | PRN

## 2022-12-11 NOTE — Progress Notes (Signed)
PROVIDER NOTE: Information contained herein reflects review and annotations entered in association with encounter. Interpretation of such information and data should be left to medically-trained personnel. Information provided to patient can be located elsewhere in the medical record under "Patient Instructions". Document created using STT-dictation technology, any transcriptional errors that may result from process are unintentional.    Patient: Alison Cox  Service Category: E/M  Provider: Edward Jolly, MD  DOB: 1955/07/31  DOS: 12/11/2022  Referring Provider: Merlene Laughter, MD  MRN: [id number]  Specialty: Interventional Pain Management  PCP: Merlene Laughter, MD (Inactive)  Type: Established Patient  Setting: Ambulatory outpatient    Location: Office  Delivery: Face-to-face     HPI  [name], a 67 y.o. year old female, is here today because of her No primary diagnosis found.. [name] primary complain today is Knee Pain (Bilateral ), Ankle Pain (Bilateral ), and Back Pain (Lumbar bilateral )  Pertinent problems: 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 Pain Assessment: Severity of Chronic pain is reported as a 0-No pain (3 when she gets up to move)/10. Location: Knee (see visit info for additional pain sites) Left, Right/ . Onset:  More than a month ago. Quality: Sharp, Aching, Burning, Shooting, Discomfort, Constant. Timing: Constant. Modifying factor(s): sitting, medications. Vitals:  height is 5\' 3"  (1.6 m) and weight is 311 lb (141.1 kg) (abnormal). Her temporal temperature is 97.9 F (36.6 C). Her blood pressure is 173/64 (abnormal) and her pulse is 90. Her respiration is 16 and oxygen saturation is 96%.  BMI: Estimated body mass index is 55.09 kg/m as calculated from the following:   Height as of this encounter: 5\' 3"  (1.6 m).   Weight as of this encounter: 311 lb (141.1 kg). Last encounter: 09/11/2022. Last procedure: Visit date not found.  Reason for encounter: post-procedure evaluation and assessment.   No change in medical history since last visit.  Patient's pain is at baseline.  Patient continues multimodal pain regimen as prescribed.  States that it provides pain relief and improvement in functional status.   Pharmacotherapy Assessment  Analgesic: Hydrocodone 10 mg every 6 hours PRN pain  Monitoring: Alison Cox PMP: PDMP reviewed during this encounter.       Pharmacotherapy: No side-effects or adverse reactions reported. Compliance: No problems identified. Effectiveness: Clinically acceptable.  Vernie Ammons, RN  12/11/2022  2:14 PM  Sign when Signing Visit Nursing Pain Medication Assessment:  Safety precautions to be maintained throughout the outpatient stay will include: orient to surroundings, keep bed in low position, maintain call bell within reach at all times, provide assistance with transfer out of bed and ambulation.  Medication Inspection Compliance: Pill count conducted under aseptic conditions, in front of the patient. Neither the pills nor the bottle was removed from the patient's sight at any time. Once count was completed pills were immediately returned to the patient in their original bottle.  Medication: Hydrocodone/APAP Pill/Patch Count:  33 of 120 pills remain  Pill/Patch Appearance:  Markings consistent with prescribed medication Bottle Appearance: Standard pharmacy container. Clearly labeled. Filled Date: 07 / 29 / 2024 Last Medication intake:  Today  Patient reports she has 2 tablets in her car.    No results found for: "CBDTHCR" No results found for: "D8THCCBX" No results found for: "D9THCCBX"  UDS:  Summary  Date Value Ref Range Status  02/12/2022 Note  Final    Comment:    ==================================================================== [facility] 13 (MW) ==================================================================== Test                             Result       Flag       Units  Drug Present and Declared for Prescription Verification   Hydrocodone                    >2551        EXPECTED   ng/mg creat   Hydromorphone                  45           EXPECTED   ng/mg creat   Dihydrocodeine                 736          EXPECTED   ng/mg creat   Norhydrocodone                 >1276        EXPECTED   ng/mg creat    Sources of hydrocodone include scheduled prescription medications.    Hydromorphone, dihydrocodeine and norhydrocodone are expected    metabolites of hydrocodone. Hydromorphone and dihydrocodeine are    also available as scheduled prescription medications.  ==================================================================== Test                      Result    Flag   Units      Ref Range   Creatinine              392              mg/dL      >=43 ==================================================================== Declared Medications:  The flagging and interpretation on this report are based on the  following declared medications.  Unexpected results may arise from  inaccuracies in the declared medications.   **Note: The testing scope of this panel includes these medications:   Hydrocodone (Norco)   **Note: The testing scope of this panel does not include the  following reported medications:   Acetaminophen (Norco)   Albuterol  Amlodipine  Calcium  Cholecalciferol  Ciclopirox  Doxycycline  Eye Drop  Fluocinolone  Furosemide (Lasix)  Ketoconazole (Nizoral)  Levothyroxine  Losartan  Magnesium (Mag-Ox)  Meloxicam  Naloxone (Narcan)  Nystatin  Spironolactone  Topical  Triamcinolone ==================================================================== For clinical consultation, please call [phone]. ====================================================================       ROS  Constitutional: Denies any fever or chills Gastrointestinal: No reported hemesis, hematochezia, vomiting, or acute GI distress Musculoskeletal:  low back and bilateral knee pain Neurological: No reported episodes of acute onset apraxia, aphasia, dysarthria, agnosia, amnesia, paralysis, loss of coordination, or loss of consciousness  Medication Review  Cholecalciferol, Ciclopirox, Crisaborole, Difluprednate, Fluocinolone Acetonide, Fluocinolone Acetonide Body, HYDROcodone-acetaminophen, Ivermectin, Magnesium Oxide -Mg Supplement, albuterol, amLODipine, calcium carbonate, doxycycline, furosemide, ketoconazole, levothyroxine, losartan, meloxicam, naloxone, nystatin-triamcinolone, prednisoLONE acetate, and spironolactone  History Review  Allergy: Ms.  Shiflett is allergic to penicillins, shellfish allergy, and sulfa antibiotics. Drug: Ms. Granados  reports no history of drug use. Alcohol:  reports no history of alcohol use. Tobacco:  reports that she has never smoked. She has never used smokeless tobacco. Social: Ms. Doudna  reports that she has never smoked. She has never used smokeless tobacco. She reports that she does not drink alcohol and does not use drugs. 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 Surgical: Ms. Scullin  has a past surgical history that includes Endometrial ablation; DILATATION AND CURETTAGE; Corneal transplant; and Corneal transplant. Family: family history includes Hypertension in her father and mother; Thyroid disease in her father.  Laboratory Chemistry Profile   Renal Lab Results  Component Value Date   BUN 15 02/12/2022   CREATININE 1.00 02/12/2022   BCR 15 02/12/2022   GFRAA 70 01/17/2019   GFRNONAA 61 01/17/2019    Hepatic Lab Results  Component Value Date   AST 27 02/12/2022   ALT 47 05/01/2016   ALBUMIN 4.8 02/12/2022   ALKPHOS 114 02/12/2022    Electrolytes Lab Results  Component Value Date   NA 141 02/12/2022   K 4.0 02/12/2022   CL 103 02/12/2022   CALCIUM 10.2 02/12/2022   MG 1.9 02/12/2022    Bone Lab Results  Component Value Date   25OHVITD1 30 02/12/2022   25OHVITD2 <1.0 02/12/2022   25OHVITD3 30 02/12/2022    Inflammation (CRP: Acute Phase) (ESR: Chronic Phase) Lab Results  Component Value Date   CRP 8 02/12/2022   ESRSEDRATE 22 02/12/2022         Note: Above Lab results reviewed.  Recent Imaging Review  US THYROID CLINICAL DATA:  Thyroid nodule  EXAM: THYROID ULTRASOUND  TECHNIQUE: Ultrasound examination of the thyroid gland and adjacent soft tissues was performed.  COMPARISON:  2017  FINDINGS: Parenchymal Echotexture: Mildly heterogenous  Isthmus: 0.6 cm  Right lobe: 4.6 x 1.8 x 1.7 cm  Left lobe: 3.4 x 1.6 x 1.4 cm  _________________________________________________________  Estimated total number of nodules >/= 1 cm: 2  Number of spongiform nodules >/=  2 cm not described below (TR1): 0  Number of mixed cystic and solid nodules >/= 1.5 cm not described below (TR2): 0  _________________________________________________________  Nodule labeled 1 is a previously biopsied solid nodule in the mid right thyroid gland measuring up to 1.6 cm,  previously 2.0 cm in 2017. It appears slightly decreased in size with similar morphologic features.  Nodule labeled 2 is a solid isoechoic TR 3 nodule in the inferior right thyroid lobe, not well visualized on previous exam, measuring 1.1 x 1.0 x 0.5 cm. Given size (<1.4 cm) and appearance, this nodule does NOT meet TI-RADS criteria for biopsy or dedicated follow-up.  IMPRESSION: 1. Multinodular thyroid gland. 2. Nodule labeled 1 was previously biopsied, and appears to demonstrate slight decrease in size since 2017. Correlate with biopsy results. 3. No other nodules on today's exam meet criteria for further dedicated follow-up or biopsy.  The above is in keeping with the ACR TI-RADS recommendations - J Am Coll Radiol 2017;14:587-595.  Electronically Signed   By: Olive Bass M.D.   On: 07/09/2022 15:29 Note: Reviewed        Physical Exam  General appearance: Well nourished, well developed, and well hydrated.  In no apparent acute distress Mental status: Alert, oriented x 3 (person, place, & time)       Respiratory: No evidence of acute respiratory distress Eyes: PERLA Vitals: BP (!) 173/64 (BP Location: Right Arm, Patient Position: Sitting, Cuff Size: Normal) Comment (Cuff Size): forearm  Pulse 90   Temp 97.9 F (36.6 C) (Temporal)   Resp 16   Ht 5\' 3"  (1.6 m)   Wt (!) 311 lb (141.1 kg)   SpO2 96%   BMI 55.09 kg/m  BMI: Estimated body mass index is 55.09 kg/m as calculated from the following:   Height as of this encounter: 5\' 3"  (1.6 m).   Weight as of this encounter: 311 lb (141.1 kg). Ideal: Ideal body weight: 52.4 kg (115 lb 8.3 oz) Adjusted ideal body weight: 87.9 kg (193 lb 11.4 oz)  B/L knee and LBP  Assessment   Diagnosis Status  1. Lumbar spondylosis   2. Chronic pain syndrome   3. Pharmacologic therapy   4. Grade 1 Anterolisthesis of L4 over L5   5. Chronic knee pain (1ry area of Pain) (Bilateral) (R>L)   6. Encounter for medication management   7.  Encounter for therapeutic procedure   8. Chronic use of opiate for therapeutic purpose   9. Chronic ankle pain (2ry area of Pain) (Bilateral) (L>R)   10. Chronic low back pain (3ry area of Pain) (midline)    Controlled Controlled Controlled    Plan of Care   Ms. IFE CRAPSER has a current medication list which includes the following long-term medication(s): albuterol, amlodipine, calcium carbonate, cholecalciferol, furosemide, levothyroxine, losartan, magnesium oxide -mg supplement, meloxicam, spironolactone, [START ON 12/18/2022] hydrocodone-acetaminophen, [START ON 01/17/2023] hydrocodone-acetaminophen, and [START ON 02/16/2023] hydrocodone-acetaminophen.  Pharmacotherapy (Medications Ordered): Meds ordered this encounter  Medications   HYDROcodone-acetaminophen (NORCO) 10-325 MG tablet    Sig: Take 1 tablet by mouth every 6 (six) hours as needed for severe pain. Must last 30 days    Dispense:  120 tablet    Refill:  0    Chronic Pain: STOP Act (Not applicable) Fill 1 day early if closed on refill date.   HYDROcodone-acetaminophen (NORCO) 10-325 MG tablet    Sig: Take 1 tablet by mouth every 6 (six) hours as needed for severe pain. Must last 30 days    Dispense:  120 tablet    Refill:  0    Chronic Pain: STOP Act (Not applicable) Fill 1 day early if closed on refill date.   HYDROcodone-acetaminophen (NORCO) 10-325 MG tablet    Sig: Take 1 tablet by mouth every 6 (six) hours as needed for severe pain. Must last 30 days    Dispense:  120 tablet    Refill:  0    Chronic Pain: STOP Act (Not applicable) Fill 1 day early if closed on refill date.     Follow-up plan:   Return in about 3 months (around 03/17/2023) for Medication Management, in person.      Recent Visits No visits were found meeting these conditions. Showing recent visits within past 90 days and meeting all other requirements Today's Visits Date Type Provider Dept  12/11/22 Office Visit Edward Jolly, MD [facility]  Showing today's visits and meeting all other requirements Future Appointments No visits were found meeting these conditions. Showing future appointments within next 90 days and meeting all other requirements  I discussed the assessment and treatment plan with the patient. The patient was provided an opportunity to ask questions and  all were answered. The patient agreed with the plan and demonstrated an understanding of the instructions.  Patient advised to call back or seek an in-person evaluation if the symptoms or condition worsens.  Duration of encounter: .  Total time on encounter, as per AMA guidelines included both the face-to-face and non-face-to-face time personally spent by the physician and/or other qualified health care professional(s) on the day of the encounter (includes time in activities that require the physician or other qualified health care professional and does not include time in activities normally performed by clinical staff). Physician's time may include the following activities when performed: Preparing to see the patient (e.g., pre-charting review of records, searching for previously ordered imaging, lab work, and nerve conduction tests) Review of prior analgesic pharmacotherapies. Reviewing PMP Interpreting ordered tests (e.g., lab work, imaging, nerve conduction tests) Performing post-procedure evaluations, including interpretation of diagnostic procedures Obtaining and/or reviewing separately obtained history Performing a medically appropriate examination and/or evaluation Counseling and educating the patient/family/caregiver Ordering medications, tests, or procedures Referring and communicating with other health care professionals (when not separately reported) Documenting clinical information in the electronic or other health record Independently interpreting results (not separately reported) and communicating results to the patient/  family/caregiver Care coordination (not separately reported)  Note by: Edward Jolly, MD Date: 12/11/2022; Time: 2:30 PM

## 2022-12-11 NOTE — Progress Notes (Signed)
Nursing Pain Medication Assessment:  Safety precautions to be maintained throughout the outpatient stay will include: orient to surroundings, keep bed in low position, maintain call bell within reach at all times, provide assistance with transfer out of bed and ambulation.  Medication Inspection Compliance: Pill count conducted under aseptic conditions, in front of the patient. Neither the pills nor the bottle was removed from the patient's sight at any time. Once count was completed pills were immediately returned to the patient in their original bottle.  Medication: Hydrocodone/APAP Pill/Patch Count:  33 of 120 pills remain Pill/Patch Appearance: Markings consistent with prescribed medication Bottle Appearance: Standard pharmacy container. Clearly labeled. Filled Date: 07 / 29 / 2024 Last Medication intake:  Today  Patient reports she has 2 tablets in her car.

## 2022-12-19 DIAGNOSIS — R600 Localized edema: Secondary | ICD-10-CM | POA: Diagnosis not present

## 2022-12-26 DIAGNOSIS — S91302A Unspecified open wound, left foot, initial encounter: Secondary | ICD-10-CM | POA: Diagnosis not present

## 2022-12-26 DIAGNOSIS — Z6841 Body Mass Index (BMI) 40.0 and over, adult: Secondary | ICD-10-CM | POA: Diagnosis not present

## 2023-01-07 ENCOUNTER — Ambulatory Visit: Payer: Medicare PPO | Admitting: Dermatology

## 2023-01-07 DIAGNOSIS — L821 Other seborrheic keratosis: Secondary | ICD-10-CM | POA: Diagnosis not present

## 2023-01-07 DIAGNOSIS — L719 Rosacea, unspecified: Secondary | ICD-10-CM | POA: Diagnosis not present

## 2023-01-07 DIAGNOSIS — L820 Inflamed seborrheic keratosis: Secondary | ICD-10-CM | POA: Diagnosis not present

## 2023-01-07 DIAGNOSIS — Z79899 Other long term (current) drug therapy: Secondary | ICD-10-CM | POA: Diagnosis not present

## 2023-01-07 DIAGNOSIS — Z7189 Other specified counseling: Secondary | ICD-10-CM

## 2023-01-07 DIAGNOSIS — L219 Seborrheic dermatitis, unspecified: Secondary | ICD-10-CM

## 2023-01-07 MED ORDER — KETOCONAZOLE 2 % EX SHAM: MEDICATED_SHAMPOO | CUTANEOUS | 11 refills | Status: DC

## 2023-01-07 MED ORDER — FLUOCINOLONE ACETONIDE BODY 0.01 % EX OIL: TOPICAL_OIL | CUTANEOUS | 5 refills | Status: DC

## 2023-01-07 MED ORDER — DOXYCYCLINE HYCLATE 100 MG PO TABS: 100.0000 mg | ORAL_TABLET | Freq: Every day | ORAL | 3 refills | Status: DC

## 2023-01-07 NOTE — Patient Instructions (Addendum)
Cryotherapy Aftercare  Wash gently with soap and water everyday.   Apply Vaseline and Band-Aid daily until healed.     Due to recent changes in healthcare laws, you may see results of your pathology and/or laboratory studies on MyChart before the doctors have had a chance to review them. We understand that in some cases there may be results that are confusing or concerning to you. Please understand that not all results are received at the same time and often the doctors may need to interpret multiple results in order to provide you with the best plan of care or course of treatment. Therefore, we ask that you please give [place] 2 business days to thoroughly review all your results before contacting the office for clarification. Should we see a critical lab result, you will be contacted sooner.   If You Need Anything After Your Visit  If you have any questions or concerns for your doctor, please call our main line at [phone] and press option 4 to reach your doctor's medical assistant. If no one answers, please leave a voicemail as directed and we will return your call as soon as possible. Messages left after 4 pm will be answered the following business day.   You may also send [place] a message via MyChart. We typically respond to MyChart messages within 1-2 business days.  For prescription refills, please ask your pharmacy to contact our office. Our fax number is [phone].  If you have an urgent issue when the clinic is closed that cannot wait until the next business day, you can page your doctor at the number below.    Please note that while we do our best to be available for urgent issues outside of office hours, we are not available 24/7.   If you have an urgent issue and are unable to reach [place], you may choose to seek medical care at your doctor's office, retail clinic, urgent care center, or emergency room.  If you have a medical emergency, please immediately call 911 or go to the  emergency department.  Pager Numbers  - Dr. Gwen Pounds: [phone]  - Dr. Roseanne Reno: [phone]  - Dr. Katrinka Blazing: [phone]   In the event of inclement weather, please call our main line at [phone] for an update on the status of any delays or closures.  Dermatology Medication Tips: Please keep the boxes that topical medications come in in order to help keep track of the instructions about where and how to use these. Pharmacies typically print the medication instructions only on the boxes and not directly on the medication tubes.   If your medication is too expensive, please contact our office at [phone] option 4 or send [place] a message through MyChart.   We are unable to tell what your co-pay for medications will be in advance as this is different depending on your insurance coverage. However, we may be able to find a substitute medication at lower cost or fill out paperwork to get insurance to cover a needed medication.   If a prior authorization is required to get your medication covered by your insurance company, please allow [place] 1-2 business days to complete this process.  Drug prices often vary depending on where the prescription is filled and some pharmacies may offer cheaper prices.  The website www.goodrx.com contains coupons for medications through different pharmacies. The prices here do not account for what the cost may be with help from insurance (it may be cheaper with your insurance), but the website  can give you the price if you did not use any insurance.  - You can print the associated coupon and take it with your prescription to the pharmacy.  - You may also stop by our office during regular business hours and pick up a GoodRx coupon card.  - If you need your prescription sent electronically to a different pharmacy, notify our office through [facility] or by phone at [phone] option 4.     Si Usted Necesita Algo Despus de Su Visita  Tambin puede  enviarnos un mensaje a travs de Clinical cytogeneticist. Por lo general respondemos a los mensajes de MyChart en el transcurso de 1 a 2 das hbiles.  Para renovar recetas, por favor pida a su farmacia que se ponga en contacto con nuestra oficina. Annie Sable de fax es [place] [phone].  Si tiene un asunto urgente cuando la clnica est cerrada y que no puede esperar hasta el siguiente da hbil, puede llamar/localizar a su doctor(a) al nmero que aparece a continuacin.   Por favor, tenga en cuenta que aunque hacemos todo lo posible para estar disponibles para asuntos urgentes fuera del horario de [place], no estamos disponibles las 24 horas del da, los 7 [address]  [address] de la [place].   Si tiene un problema urgente y no puede comunicarse con nosotros, puede optar por buscar atencin mdica  en el consultorio de su doctor(a), en una clnica privada, en un centro de atencin urgente o en una sala de emergencias.  Si tiene Engineer, drilling, por favor llame inmediatamente al 911 o vaya a la sala de emergencias.  Nmeros de bper  - Dr. Gwen Pounds: [phone]  - Dra. Roseanne Reno: [phone]  - Dr. Katrinka Blazing: [phone]   En caso de inclemencias del tiempo, por favor llame a Lacy Duverney principal al [phone] para una actualizacin sobre el [place] de cualquier retraso o cierre.  Consejos para la medicacin en dermatologa: Por favor, guarde las cajas en las que vienen los medicamentos de uso tpico para ayudarle a seguir las instrucciones sobre dnde y cmo usarlos. Las farmacias generalmente imprimen las instrucciones del medicamento slo en las cajas y no directamente en los tubos del [place].   Si su medicamento es muy caro, por favor, pngase en contacto con Rolm Gala llamando al [phone] y presione la opcin 4 o envenos un mensaje a travs de Clinical cytogeneticist.   No podemos decirle cul ser su copago por los medicamentos por adelantado ya que esto es diferente dependiendo de la cobertura de su  seguro. Sin embargo, es posible que podamos encontrar un medicamento sustituto a Audiological scientist un formulario para que el seguro cubra el medicamento que se considera necesario.   Si se requiere una autorizacin previa para que su compaa de seguros [place] su medicamento, por favor permtanos de 1 a 2 das hbiles para completar [address]  Los precios de los medicamentos varan con frecuencia dependiendo del Environmental consultant de dnde se surte la receta y alguna farmacias pueden ofrecer precios ms baratos.  El sitio web www.goodrx.com tiene cupones para medicamentos de Health and safety inspector. Los precios aqu no tienen en cuenta lo que podra costar con la ayuda del seguro (puede ser ms barato con su seguro), pero el sitio web puede darle el precio si no utiliz Tourist information centre manager.  - Puede imprimir el cupn correspondiente y llevarlo con su receta a la farmacia.  - Tambin puede pasar por nuestra oficina durante el horario de atencin regular y Education officer, museum una tarjeta de cupones de GoodRx.  -  Si necesita que su receta se enve electrnicamente a Psychiatrist, informe a nuestra oficina a travs de MyChart de [place] o por telfono llamando al [phone] y presione la opcin 4.

## 2023-01-07 NOTE — Progress Notes (Unsigned)
Follow-Up Visit   Subjective  Alison Cox is a 67 y.o. female who presents for the following: rosacea; Seb Derm and spots. The patient has spots, moles and lesions to be evaluated, some may be new or changing and the patient may have concern these could be cancer.   The following portions of the chart were reviewed this encounter and updated as appropriate: medications, allergies, medical history  Review of Systems:  No other skin or systemic complaints except as noted in HPI or Assessment and Plan.  Objective  Well appearing patient in no apparent distress; mood and affect are within normal limits.  A focused examination was performed of the following areas:face,scalp,ears    Relevant exam findings are noted in the Assessment and Plan.  Left Upper Back Stuck-on, waxy, tan-brown papule or plaque --Discussed benign etiology and prognosis.     Assessment & Plan     Inflamed seborrheic keratosis Left Upper Back  Symptomatic, irritating, patient would like treated.   Destruction of lesion - Left Upper Back Complexity: simple   Destruction method: cryotherapy   Informed consent: discussed and consent obtained   Timeout:  patient name, date of birth, surgical site, and procedure verified Lesion destroyed using liquid nitrogen: Yes   Region frozen until ice ball extended beyond lesion: Yes   Outcome: patient tolerated procedure well with no complications   Post-procedure details: wound care instructions given    SEBORRHEIC KERATOSIS - Stuck-on, waxy, tan-brown papules and/or plaques  - Benign-appearing - Discussed benign etiology and prognosis. - Observe - Call for any changes  Rosacea Related Medications SOOLANTRA 1 % CREA APPLY 1 APPLICATION TOPICALLY AT BEDTIME.  doxycycline (VIBRA-TABS) 100 MG tablet Take 1 tablet (100 mg total) by mouth daily. with food  Seborrheic dermatitis Related Medications Fluocinolone Acetonide Body 0.01 % OIL Apply to ears once  or twice a day prn flares  ketoconazole (NIZORAL) 2 % shampoo apply three times per week, massage into scalp and leave in for 10 minutes before rinsing out  Rosacea face Mid face erythema with telangiectasias +/- scattered inflammatory papules.  Chronic and persistent condition with duration or expected duration over one year. Condition is improving with treatment but not currently at goal. Rosacea is a chronic progressive skin condition usually affecting the face of adults, causing redness and/or acne bumps. It is treatable but not curable. It sometimes affects the eyes (ocular rosacea) as well. It may respond to topical and/or systemic medication and can flare with stress, sun exposure, alcohol, exercise and some foods.  Daily application of broad spectrum spf 30+ sunscreen to face is recommended to reduce flares.    Discussed Isotretinoin therapy an option, pt decline Isotretinoin    Cont Doxycycline 100 mg once a day  Doxycycline should be taken with food to prevent nausea. Do not lay down for 30 minutes after taking. Be cautious with sun exposure and use good sun protection while on this medication. Pregnant women should not take this medication.    Seborrheic dermatitis Scalp, ears-Pink patches with greasy scale.  Chronic and persistent condition with duration or expected duration over one year. Condition is improving with treatment but not currently at goal. Seborrheic Dermatitis  -  is a chronic persistent rash characterized by pinkness and scaling most commonly of the mid face but also can occur on the scalp (dandruff), ears; mid chest and mid back. It tends to be exacerbated by stress and cooler weather.  People who have neurologic disease may experience new  onset or exacerbation of existing seborrheic dermatitis.  The condition is not curable but treatable and can be controlled.    Cont Ketoconazole shampoo as directed  Cont Fluocinolone oil as directed to ears   Return in about  1 year (around 01/07/2024) for Rosacea, seborrheic dermatitis.  IAngelique Holm, CMA, am acting as scribe for Armida Sans, MD .   Documentation: I have reviewed the above documentation for accuracy and completeness, and I agree with the above.  Armida Sans, MD

## 2023-01-08 ENCOUNTER — Encounter: Payer: Self-pay | Admitting: Dermatology

## 2023-01-08 ENCOUNTER — Ambulatory Visit (INDEPENDENT_AMBULATORY_CARE_PROVIDER_SITE_OTHER): Payer: Medicare PPO | Admitting: Podiatry

## 2023-01-08 ENCOUNTER — Other Ambulatory Visit: Payer: Self-pay | Admitting: Podiatry

## 2023-01-08 DIAGNOSIS — L97422 Non-pressure chronic ulcer of left heel and midfoot with fat layer exposed: Secondary | ICD-10-CM

## 2023-01-08 DIAGNOSIS — M79675 Pain in left toe(s): Secondary | ICD-10-CM

## 2023-01-08 DIAGNOSIS — M79674 Pain in right toe(s): Secondary | ICD-10-CM | POA: Diagnosis not present

## 2023-01-08 DIAGNOSIS — B351 Tinea unguium: Secondary | ICD-10-CM | POA: Diagnosis not present

## 2023-01-08 MED ORDER — MUPIROCIN 2 % EX OINT: 1.0000 | TOPICAL_OINTMENT | Freq: Two times a day (BID) | CUTANEOUS | 0 refills | Status: DC

## 2023-01-08 NOTE — Telephone Encounter (Signed)
Once per day

## 2023-01-08 NOTE — Progress Notes (Signed)
Subjective:  Patient ID: Alison Cox, female    DOB: 01/21/1956,  MRN: [id number]  Chief Complaint  Patient presents with   Wound Check    Wound check to top of left foot. Some swelling around the wound. Patient has been applying Aloe from a plant. Not diabetic. X 6 months ago. PA suggested silvadene but has not used the cream.    Nail Problem    Nail fungus bilateral feet. Lamisil was taken for a finger nail fungus.   Nail trim     Discussed the use of AI scribe software for clinical note transcription with the patient, who gave verbal consent to proceed.  History of Present Illness   The patient presents with a chronic wound on the dorsal aspect of the left foot, present for at least six months. The wound was initially caused by friction from sandals worn without socks. Despite intermittent improvement, the wound has not fully healed and maintains a persistent hole. The patient reports that the wound is often painful, describing a deep pain that she suspects may be nerve or bone related. The wound drains a dark brown fluid, which is managed with a pad when she wears shoes. The patient has been applying aloe vera to the wound, which she reports helps with the tenderness around the wound. The patient denies any numbness in the foot. She has not been on any antibiotics specifically for the wound, but she does take doxycycline daily for rosacea and has been on and off antibiotics for recurrent bladder infections. The patient also reports issues with her toenails, which are infected with fungus and become painful when they grow long.          Objective:    Physical Exam   EXTREMITIES: Pulses in the left foot are normal, with two out of four pulses palpable. SKIN: Wound on the midfoot dorsal aspect of the left foot is healthy overall, measuring approximately 0.5 cm by 0.5 cm by 0.3 cm. No redness observed around the wound. The wound does not probe to bone, indicating no direct bone  involvement. Nails on the left foot exhibit fungal infection, with the great toenail showing onycholysis and the second toenail is blackened due to trauma.       No images are attached to the encounter.    Results   Procedure: Nail trimming Description: Trimmed all toenails, including the left great toenail with onychomycosis and the second toe with subungual hematoma.      Assessment:   1. 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 Pain due to onychomycosis of toenails of both feet      Plan:  Patient was evaluated and treated and all questions answered.  Assessment and Plan    Chronic Wound on Left Foot - Dorsal midfoot ulcer Present for six months, with intermittent improvement. No signs of infection or bone involvement. Painful with dark drainage. No history of diabetes. -Debride wound today. -Prescribe Mupirocin ointment for daily application and dressing. -Refer to wound care center for further management. -Consider imaging if no improvement at next visit. -No evidence of PAD - Can't rule out underlying OM as a cause of nonhealing at this time, pt aware  Procedure: Excisional Debridement of Wound Rationale: Removal of non-viable soft tissue from the wound to promote healing.  Anesthesia: none Post-Debridement Wound Measurements: 0.5 cm x 0.5 cm x 0.3 cm  Type of Debridement: Sharp Excisional Tissue Removed: Non-viable soft tissue Depth of Debridement: subcutaneous  tissue. Technique: Sharp excisional debridement to bleeding, viable wound base.  Dressing: Dry, sterile, compression dressing. Disposition: Patient tolerated procedure well.   Onychomycosis Fungal infection in toenails causing discomfort when long. -Trim toenails today. -Monitor for now, consider removal of big toenail if it becomes too painful. #Onychomycosis with pain  -Nails palliatively debrided as below. -Educated on self-care  Procedure: Nail Debridement Rationale: Pain Type of  Debridement: manual, sharp debridement. Instrumentation: Nail nipper, rotary burr. Number of Nails: 10  Follow-up in three weeks to assess wound healing.                 Corinna Gab, DPM [facility] / [facility]                   01/08/2023

## 2023-01-29 ENCOUNTER — Ambulatory Visit: Payer: Medicare PPO | Admitting: Podiatry

## 2023-01-29 DIAGNOSIS — L97422 Non-pressure chronic ulcer of left heel and midfoot with fat layer exposed: Secondary | ICD-10-CM | POA: Diagnosis not present

## 2023-01-29 NOTE — Progress Notes (Addendum)
  Subjective:  Patient ID: Alison Cox, female    DOB: April 20, 1956,  MRN: [id number]  Chief Complaint  Patient presents with   Foot Ulcer    Left foot    67 y.o. female presents for follow up Left midfoot midfoot ulcer.  Has been applying mupirocin ointment and dressing daily as instructed.  Thinks the wound is slightly improved from prior.  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  Fibrotic base.  Very tender to palpation.  No erythema surrounding no drainage.  Probes to subcutaneous fat tissue.  Ulcer measures 0.6 x 0.5 x 0.2 cm postdebridement.  See predebridement picture below.  Nails thickened elongated dystrophic x 5 bilateral foot.    Vascular:  Dorsalis Pedis artery and Posterior Tibial artery pedal pulses are 2/4 bilateral.  Capillary fill time < 3 sec to all digits.   Neruologic: Grossly intact via light touch bilateral. Protective threshold intact to all sites bilateral.   Musculoskeletal: No gross boney pedal deformities bilateral.  Pain on palpation of the ulcer muscle strength 5 out of 5  Gait: Unassisted, Nonantalgic.   No images are attached to the encounter.   Assessment:   1. 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  Ulcer left dorsal midfoot -We discussed the etiology and  factors that are a part of the wound healing process.  We also discussed the risk of infection both soft tissue and osteomyelitis from open ulceration.  Discussed the risk of limb loss if this happens or worsens. -Debridement as below. -Dressed with triple antibiotic ointment, DSD. -Continue home dressing changes daily with mupirocin ointment and dry sterile gauze or adhesive bandage dressing -Continue off-loading with felt padding. -Vascular testing deferred patient has palpable pedal pulses -HgbA1c: Not taken recently -Last antibiotics: Takes doxycycline chronically for other issues -Imaging: Deferred -Patient has upcoming appointment at the wound care center appreciate assistance from wound care doctors.  Procedure: Excisional Debridement of Wound Rationale: Removal of non-viable soft tissue from the wound to promote healing.  Anesthesia: none Post-Debridement Wound Measurements: 0.6 cm x 0.5 cm x 0.2 cm  Type of Debridement: Sharp Excisional Tissue Removed: Non-viable soft tissue Depth of Debridement: subcutaneous tissue. Technique: Sharp excisional debridement to bleeding, viable wound base.  Dressing: Dry, sterile, compression dressing. Disposition: Patient tolerated procedure well.      Return in about 6 weeks (around 03/12/2023) for f/u L midfoot ulcer, nail care.          Corinna Gab, DPM [facility] / [facility]

## 2023-01-30 DIAGNOSIS — E041 Nontoxic single thyroid nodule: Secondary | ICD-10-CM | POA: Diagnosis not present

## 2023-01-30 DIAGNOSIS — Z23 Encounter for immunization: Secondary | ICD-10-CM | POA: Diagnosis not present

## 2023-01-30 DIAGNOSIS — E039 Hypothyroidism, unspecified: Secondary | ICD-10-CM | POA: Diagnosis not present

## 2023-01-30 DIAGNOSIS — E063 Autoimmune thyroiditis: Secondary | ICD-10-CM | POA: Diagnosis not present

## 2023-02-05 ENCOUNTER — Ambulatory Visit (HOSPITAL_BASED_OUTPATIENT_CLINIC_OR_DEPARTMENT_OTHER): Payer: Medicare PPO | Admitting: General Surgery

## 2023-03-04 DIAGNOSIS — Z947 Corneal transplant status: Secondary | ICD-10-CM | POA: Diagnosis not present

## 2023-03-04 DIAGNOSIS — H18553 Macular corneal dystrophy, bilateral: Secondary | ICD-10-CM | POA: Diagnosis not present

## 2023-03-12 ENCOUNTER — Ambulatory Visit
Payer: Medicare PPO | Attending: Student in an Organized Health Care Education/Training Program | Admitting: Student in an Organized Health Care Education/Training Program

## 2023-03-12 ENCOUNTER — Encounter: Payer: Self-pay | Admitting: Student in an Organized Health Care Education/Training Program

## 2023-03-12 VITALS — BP 156/99 | HR 93 | Temp 97.3°F | Resp 16 | Ht 63.0 in | Wt 300.0 lb

## 2023-03-12 DIAGNOSIS — G894 Chronic pain syndrome: Secondary | ICD-10-CM | POA: Diagnosis not present

## 2023-03-12 DIAGNOSIS — Z79899 Other long term (current) drug therapy: Secondary | ICD-10-CM | POA: Diagnosis not present

## 2023-03-12 DIAGNOSIS — G8929 Other chronic pain: Secondary | ICD-10-CM | POA: Diagnosis not present

## 2023-03-12 DIAGNOSIS — Z79891 Long term (current) use of opiate analgesic: Secondary | ICD-10-CM | POA: Insufficient documentation

## 2023-03-12 DIAGNOSIS — M47816 Spondylosis without myelopathy or radiculopathy, lumbar region: Secondary | ICD-10-CM | POA: Diagnosis not present

## 2023-03-12 DIAGNOSIS — Z6841 Body Mass Index (BMI) 40.0 and over, adult: Secondary | ICD-10-CM | POA: Diagnosis not present

## 2023-03-12 DIAGNOSIS — M25562 Pain in left knee: Secondary | ICD-10-CM | POA: Insufficient documentation

## 2023-03-12 DIAGNOSIS — M4316 Spondylolisthesis, lumbar region: Secondary | ICD-10-CM

## 2023-03-12 DIAGNOSIS — M25561 Pain in right knee: Secondary | ICD-10-CM | POA: Diagnosis not present

## 2023-03-12 DIAGNOSIS — Z5189 Encounter for other specified aftercare: Secondary | ICD-10-CM | POA: Insufficient documentation

## 2023-03-12 DIAGNOSIS — E66813 Obesity, class 3: Secondary | ICD-10-CM | POA: Insufficient documentation

## 2023-03-12 DIAGNOSIS — E662 Morbid (severe) obesity with alveolar hypoventilation: Secondary | ICD-10-CM | POA: Insufficient documentation

## 2023-03-12 DIAGNOSIS — M25571 Pain in right ankle and joints of right foot: Secondary | ICD-10-CM | POA: Diagnosis not present

## 2023-03-12 DIAGNOSIS — M545 Low back pain, unspecified: Secondary | ICD-10-CM | POA: Insufficient documentation

## 2023-03-12 DIAGNOSIS — M25572 Pain in left ankle and joints of left foot: Secondary | ICD-10-CM | POA: Insufficient documentation

## 2023-03-12 DIAGNOSIS — M4310 Spondylolisthesis, site unspecified: Secondary | ICD-10-CM | POA: Insufficient documentation

## 2023-03-12 MED ORDER — HYDROCODONE-ACETAMINOPHEN 10-325 MG PO TABS: 1.0000 | ORAL_TABLET | Freq: Four times a day (QID) | ORAL | 0 refills | Status: DC | PRN

## 2023-03-12 NOTE — Progress Notes (Signed)
Nursing Pain Medication Assessment:  Safety precautions to be maintained throughout the outpatient stay will include: orient to surroundings, keep bed in low position, maintain call bell within reach at all times, provide assistance with transfer out of bed and ambulation.  Medication Inspection Compliance: Pill count conducted under aseptic conditions, in front of the patient. Neither the pills nor the bottle was removed from the patient's sight at any time. Once count was completed pills were immediately returned to the patient in their original bottle.  Medication: Hydrocodone/APAP Pill/Patch Count:  31 of 120 pills remain Pill/Patch Appearance: Markings consistent with prescribed medication Bottle Appearance: Standard pharmacy container. Clearly labeled. Filled Date: 53 / 28 / 2024 Last Medication intake:  Today

## 2023-03-12 NOTE — Progress Notes (Signed)
PROVIDER NOTE: Information contained herein reflects review and annotations entered in association with encounter. Interpretation of such information and data should be left to medically-trained personnel. Information provided to patient can be located elsewhere in the medical record under "Patient Instructions". Document created using STT-dictation technology, any transcriptional errors that may result from process are unintentional.    Patient: Alison Cox  Service Category: E/M  Provider: Edward Jolly, MD  DOB: [birth year]  DOS: 03/12/2023  Referring Provider: No ref. provider found  MRN: [id number]  Specialty: Interventional Pain Management  PCP: Thana Ates, MD  Type: Established Patient  Setting: Ambulatory outpatient    Location: Office  Delivery: Face-to-face     HPI  Ms. SAROJ STANARD, a 67 y.o. year old female, is here today because of her Lumbar spondylosis [M47.816]. Ms. Leidel primary complain today is Knee Pain (Bilateral ) and Back Pain (Lumbar bilateral )  Pertinent problems: Ms. 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 Pain Assessment: Severity of Chronic pain is reported as a 1 /10. Location: Knee (lower back) Left, Right/denies. Onset: More than a month ago. Quality: Discomfort, Aching, Other (Comment), Sharp (lower back catches  and pulls). Timing: Constant. Modifying factor(s): medications, rest, heat. Vitals:  height is 5\' 3"  (1.6 m) and weight is 300 lb (136.1 kg). Her temporal temperature is 97.3 F (36.3 C) (abnormal). Her blood pressure is 156/99 (abnormal) and her pulse is 93. Her respiration is 16 and oxygen saturation is 96%.  BMI: Estimated body mass index is 53.14 kg/m as calculated from the following:   Height as of this encounter: 5\' 3"  (1.6 m).   Weight as of this encounter: 300 lb (136.1 kg). Last encounter: 09/11/2022. Last procedure: Visit date not found.  Reason for encounter: post-procedure evaluation and assessment.   No change in medical history since last visit.  Patient's pain is at baseline.  Patient continues multimodal pain regimen as prescribed.  States that it provides pain relief and improvement in functional status.   Pharmacotherapy Assessment  Analgesic: Hydrocodone 10 mg every 6 hours PRN pain  Monitoring: [place] PMP: PDMP reviewed during this encounter.       Pharmacotherapy: No side-effects or adverse reactions reported. Compliance: No problems identified. Effectiveness: Clinically acceptable.  Vernie Ammons, RN  03/12/2023  2:21 PM  Sign when Signing Visit Nursing Pain Medication Assessment:  Safety precautions to be maintained throughout the outpatient stay will include: orient to surroundings, keep bed in low position, maintain call bell within reach at all times, provide assistance with transfer out of bed and ambulation.  Medication Inspection Compliance: Pill count conducted under aseptic conditions, in front of the patient. Neither the pills nor the bottle was removed from the patient's sight at any time. Once count was completed pills were immediately returned to the patient in their original bottle.  Medication: Hydrocodone/APAP Pill/Patch Count:  31 of 120 pills remain Pill/Patch Appearance: Markings consistent with prescribed medication Bottle Appearance:  Standard pharmacy  container. Clearly labeled. Filled Date: 35 / 28 / 2024 Last Medication intake:  Today    No results found for: "CBDTHCR" No results found for: "D8THCCBX" No results found for: "D9THCCBX"  UDS:  Summary  Date Value Ref Range Status  02/12/2022 Note  Final    Comment:    ==================================================================== [facility] (MW) ==================================================================== Test                             Result       Flag       Units  Drug Present and Declared for Prescription Verification   Hydrocodone                    >2551        EXPECTED   ng/mg creat   Hydromorphone                  45           EXPECTED   ng/mg creat   Dihydrocodeine                 736          EXPECTED   ng/mg creat   Norhydrocodone                 >1276        EXPECTED   ng/mg creat    Sources of hydrocodone include scheduled prescription medications.    Hydromorphone, dihydrocodeine and norhydrocodone are expected    metabolites of hydrocodone. Hydromorphone and dihydrocodeine are    also available as scheduled prescription medications.  ==================================================================== Test                      Result    Flag   Units      Ref Range   Creatinine              392              mg/dL      >=46 ==================================================================== Declared Medications:  The flagging and interpretation on this report are based on the  following declared medications.  Unexpected results may arise from  inaccuracies in the declared medications.   **Note: The testing scope of this panel includes these medications:   Hydrocodone (Norco)   **Note: The testing scope of this panel does not include the  following reported medications:   Acetaminophen (Norco)  Albuterol  Amlodipine  Calcium  Cholecalciferol  Ciclopirox  Doxycycline  Eye Drop  Fluocinolone  Furosemide  (Lasix)  Ketoconazole (Nizoral)  Levothyroxine  Losartan  Magnesium (Mag-Ox)  Meloxicam  Naloxone (Narcan)  Nystatin  Spironolactone  Topical  Triamcinolone ==================================================================== For clinical consultation, please call [phone]. ====================================================================       ROS  Constitutional: Denies any fever or chills Gastrointestinal: No reported hemesis, hematochezia, vomiting, or acute GI distress Musculoskeletal:  low back and bilateral knee pain Neurological: No reported episodes of acute onset apraxia, aphasia, dysarthria, agnosia, amnesia, paralysis, loss of coordination, or loss of consciousness  Medication Review  Cholecalciferol, Ciclopirox, Crisaborole, Fluocinolone Acetonide, HYDROcodone-acetaminophen, Magnesium Oxide -Mg Supplement, albuterol, amLODipine, calcium carbonate, doxycycline, furosemide, ketoconazole, levothyroxine, losartan, meloxicam, nystatin-triamcinolone, prednisoLONE acetate, and spironolactone  History Review  Allergy: Ms. Nitsch is allergic to penicillins, shellfish allergy, and sulfa antibiotics. Drug: Ms. Virgilio  reports no history of drug use. Alcohol:  reports no history of alcohol use.  Tobacco:  reports that she has never smoked. She has never used smokeless tobacco. Social: Ms. Mockus  reports that she has never smoked. She has never used smokeless tobacco. She reports that she does not drink alcohol and does not use drugs. 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 Surgical: Ms. Cue  has a past surgical history that includes Endometrial ablation; DILATATION AND CURETTAGE; Corneal transplant;  and Corneal transplant. Family: family history includes Hypertension in her father and mother; Thyroid disease in her father.  Laboratory Chemistry Profile   Renal Lab Results  Component Value Date   BUN 15 02/12/2022   CREATININE 1.00 02/12/2022   BCR 15 02/12/2022   GFRAA 70 01/17/2019   GFRNONAA 61 01/17/2019    Hepatic Lab Results  Component Value Date   AST 27 02/12/2022   ALT 47 05/01/2016   ALBUMIN 4.8 02/12/2022   ALKPHOS 114 02/12/2022    Electrolytes Lab Results  Component Value Date   NA 141 02/12/2022   K 4.0 02/12/2022   CL 103 02/12/2022   CALCIUM 10.2 02/12/2022   MG 1.9 02/12/2022    Bone Lab Results  Component Value Date   25OHVITD1 30 02/12/2022   25OHVITD2 <1.0 02/12/2022   25OHVITD3 30 02/12/2022    Inflammation (CRP: Acute Phase) (ESR: Chronic Phase) Lab Results  Component Value Date   CRP 8 02/12/2022   ESRSEDRATE 22 02/12/2022         Note: Above Lab results reviewed.  Recent Imaging Review  US THYROID CLINICAL DATA:  Thyroid nodule  EXAM: THYROID ULTRASOUND  TECHNIQUE: Ultrasound examination of the thyroid gland and adjacent soft tissues was performed.  COMPARISON:  2017  FINDINGS: Parenchymal Echotexture: Mildly heterogenous  Isthmus: 0.6 cm  Right lobe: 4.6 x 1.8 x 1.7 cm  Left lobe: 3.4 x 1.6 x 1.4 cm  _________________________________________________________  Estimated total number of nodules >/= 1 cm: 2  Number of spongiform nodules >/=  2 cm not described below (TR1): 0  Number of mixed cystic and solid nodules >/= 1.5 cm not described below (TR2): 0  _________________________________________________________  Nodule labeled 1 is a previously biopsied solid nodule in the mid right thyroid gland measuring up to 1.6 cm, previously 2.0 cm in 2017. It appears slightly decreased in size with similar morphologic features.  Nodule labeled 2 is a solid isoechoic TR 3 nodule in the inferior right thyroid lobe,  not well visualized on previous exam, measuring 1.1 x 1.0 x 0.5 cm. Given size (<1.4 cm) and appearance, this nodule does NOT meet TI-RADS criteria for biopsy or dedicated follow-up.  IMPRESSION: 1. Multinodular thyroid gland. 2. Nodule labeled 1 was previously biopsied, and appears to demonstrate slight decrease in size since 2017. Correlate with biopsy results. 3. No other nodules on today's exam meet criteria for further dedicated follow-up or biopsy.  The above is in keeping with the ACR TI-RADS recommendations - J Am Coll Radiol 2017;14:587-595.  Electronically Signed   By: Olive Bass M.D.   On: 07/09/2022 15:29 Note: Reviewed        Physical Exam  General appearance: Well nourished, well developed, and well hydrated. In no apparent acute distress Mental status: Alert, oriented x 3 (person, place, & time)       Respiratory: No evidence of acute respiratory distress  Eyes: PERLA Vitals: BP (!) 156/99 (BP Location: Right Arm, Patient Position: Sitting, Cuff Size: Large) Comment (Cuff Size): forerarm  Pulse 93   Temp (!) 97.3 F (36.3 C) (Temporal)   Resp 16   Ht 5\' 3"  (1.6 m)   Wt 300 lb (136.1 kg)   SpO2 96%   BMI 53.14 kg/m  BMI: Estimated body mass index is 53.14 kg/m as calculated from the following:   Height as of this encounter: 5\' 3"  (1.6 m).   Weight as of this encounter: 300 lb (136.1 kg). Ideal: Ideal body weight: 52.4 kg (115 lb 8.3 oz) Adjusted ideal body weight: 85.9 kg (189 lb 5 oz)  B/L knee and LBP  Assessment   Diagnosis Status  1. Lumbar spondylosis   2. Chronic pain syndrome   3. Pharmacologic therapy   4. Grade 1 Anterolisthesis of L4 over L5   5. Chronic use of opiate for therapeutic purpose   6. Chronic low back pain (3ry area of Pain) (midline)   7. Class 3 obesity with alveolar hypoventilation, serious comorbidity, and body mass index (BMI) of 50.0 to 59.9 in adult (HCC)   8. Chronic knee pain (1ry area of Pain) (Bilateral) (R>L)    9. Encounter for medication management   10. Encounter for therapeutic procedure   11. Chronic ankle pain (2ry area of Pain) (Bilateral) (L>R)     Controlled Controlled Controlled    Plan of Care   Ms. ANSLEA MEULEMANS has a current medication list which includes the following long-term medication(s): albuterol, amlodipine, calcium carbonate, cholecalciferol, furosemide, [START ON 03/18/2023] hydrocodone-acetaminophen, [START ON 04/17/2023] hydrocodone-acetaminophen, [START ON 05/17/2023] hydrocodone-acetaminophen, levothyroxine, losartan, magnesium oxide -mg supplement, meloxicam, and spironolactone.  Pharmacotherapy (Medications Ordered): Meds ordered this encounter  Medications   HYDROcodone-acetaminophen (NORCO) 10-325 MG tablet    Sig: Take 1 tablet by mouth every 6 (six) hours as needed for severe pain (pain score 7-10). Must last 30 days    Dispense:  120 tablet    Refill:  0    Chronic Pain: STOP Act (Not applicable) Fill 1 day early if closed on refill date.   HYDROcodone-acetaminophen (NORCO) 10-325 MG tablet    Sig: Take 1 tablet by mouth every 6 (six) hours as needed for severe pain (pain score 7-10). Must last 30 days    Dispense:  120 tablet    Refill:  0    Chronic Pain: STOP Act (Not applicable) Fill 1 day early if closed on refill date.   HYDROcodone-acetaminophen (NORCO) 10-325 MG tablet    Sig: Take 1 tablet by mouth every 6 (six) hours as needed for severe pain (pain score 7-10). Must last 30 days    Dispense:  120 tablet    Refill:  0    Chronic Pain: STOP Act (Not applicable) Fill 1 day early if closed on refill date.     Follow-up plan:   Return in about 3 months (around 06/16/2023) for MM, F2F.      Recent Visits No visits were found meeting these conditions. Showing recent visits within past 90 days and meeting all other requirements Today's Visits Date Type Provider Dept  03/12/23 Office Visit Edward Jolly, MD [facility]  Showing today's  visits and meeting all other requirements Future Appointments No visits were found meeting these conditions. Showing future appointments within next 90 days and meeting all other requirements  I discussed the assessment and treatment plan with the patient. The patient was provided an opportunity to  ask questions and all were answered. The patient agreed with the plan and demonstrated an understanding of the instructions.  Patient advised to call back or seek an in-person evaluation if the symptoms or condition worsens.  Duration of encounter: .  Total time on encounter, as per AMA guidelines included both the face-to-face and non-face-to-face time personally spent by the physician and/or other qualified health care professional(s) on the day of the encounter (includes time in activities that require the physician or other qualified health care professional and does not include time in activities normally performed by clinical staff). Physician's time may include the following activities when performed: Preparing to see the patient (e.g., pre-charting review of records, searching for previously ordered imaging, lab work, and nerve conduction tests) Review of prior analgesic pharmacotherapies. Reviewing PMP Interpreting ordered tests (e.g., lab work, imaging, nerve conduction tests) Performing post-procedure evaluations, including interpretation of diagnostic procedures Obtaining and/or reviewing separately obtained history Performing a medically appropriate examination and/or evaluation Counseling and educating the patient/family/caregiver Ordering medications, tests, or procedures Referring and communicating with other health care professionals (when not separately reported) Documenting clinical information in the electronic or other health record Independently interpreting results (not separately reported) and communicating results to the patient/ family/caregiver Care coordination  (not separately reported)  Note by: Edward Jolly, MD Date: 03/12/2023; Time: 2:40 PM

## 2023-03-13 ENCOUNTER — Ambulatory Visit (INDEPENDENT_AMBULATORY_CARE_PROVIDER_SITE_OTHER): Payer: Medicare PPO | Admitting: Podiatry

## 2023-03-13 DIAGNOSIS — B351 Tinea unguium: Secondary | ICD-10-CM | POA: Diagnosis not present

## 2023-03-13 DIAGNOSIS — Z6841 Body Mass Index (BMI) 40.0 and over, adult: Secondary | ICD-10-CM | POA: Diagnosis not present

## 2023-03-13 DIAGNOSIS — L97422 Non-pressure chronic ulcer of left heel and midfoot with fat layer exposed: Secondary | ICD-10-CM | POA: Diagnosis not present

## 2023-03-13 DIAGNOSIS — M79674 Pain in right toe(s): Secondary | ICD-10-CM

## 2023-03-13 DIAGNOSIS — M79675 Pain in left toe(s): Secondary | ICD-10-CM

## 2023-03-13 DIAGNOSIS — R399 Unspecified symptoms and signs involving the genitourinary system: Secondary | ICD-10-CM | POA: Diagnosis not present

## 2023-03-13 NOTE — Progress Notes (Signed)
Subjective:  Patient ID: Alison Cox, female    DOB: [birth year],  MRN: [id number]  Chief Complaint  Patient presents with   Nail Problem    f/u L midfoot ulcer, nail care    67 y.o. female presents for follow up Left midfoot midfoot ulcer.  She states the wound seems to be improving applying antibiotic ointment and Band-Aid. also here for follow-up on nails.  Thickened elongated and unable to trim on her own.  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  Fibrotic base.  Very tender to palpation.  No erythema surrounding no drainage.  Probes to subcutaneous fat tissue.  Ulcer measures 0.4 x 0.4 x 0.2 cm postdebridement.  See predebridement picture below.  Nails thickened elongated dystrophic x 5 bilateral foot.    Vascular:  Dorsalis Pedis artery and Posterior Tibial artery pedal pulses are 2/4 bilateral.  Capillary fill time < 3 sec to all digits.   Neruologic: Grossly intact via light touch bilateral. Protective threshold intact to all sites bilateral.   Musculoskeletal: No gross boney pedal deformities bilateral.  Pain on palpation of the ulcer muscle strength 5 out of 5  Gait: Unassisted, Nonantalgic.   No images are attached to the encounter.   Assessment:   1. 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 Pain due to onychomycosis of toenails of both feet        Plan:  Patient was evaluated and treated and all questions answered.  Ulcer left dorsal midfoot -We discussed the etiology and factors that are a part of the wound healing process.  We also discussed the risk of infection both soft tissue and osteomyelitis from open ulceration.  Discussed the risk of limb loss if this happens or worsens. -Debridement as below. -Dressed with triple antibiotic ointment, DSD. -Continue home dressing changes daily with mupirocin ointment and dry sterile gauze or adhesive bandage dressing -Continue off-loading with felt padding. -Vascular testing deferred patient has palpable pedal pulses -HgbA1c: Not taken recently -Last antibiotics: Takes doxycycline chronically for other issues -Imaging: Deferred -Patient can defer wound care center as wound nearly healed  Procedure: Excisional Debridement of Wound Rationale: Removal of non-viable soft tissue from the wound to promote healing.  Anesthesia: none Post-Debridement Wound Measurements: 0.4 cm x 0.4 cm x 0.2 cm  Type of Debridement: Sharp Excisional Tissue Removed: Non-viable soft tissue Depth of Debridement: subcutaneous tissue. Technique: Sharp excisional debridement to bleeding, viable wound base.  Dressing: Dry, sterile, compression dressing. Disposition: Patient tolerated procedure well.    #Onychomycosis with pain  -Nails palliatively debrided as below. -Educated on self-care  Procedure: Nail Debridement Rationale: Pain Type of Debridement: manual, sharp debridement. Instrumentation: Nail nipper, rotary burr. Number of Nails: 10  Return in about 3 months (around 06/13/2023) for RFC wound care.  Corinna Gab, DPM [facility] / [facility]

## 2023-03-14 LAB — TOXASSURE SELECT 13 (MW), URINE

## 2023-03-18 ENCOUNTER — Telehealth: Payer: Self-pay | Admitting: Student in an Organized Health Care Education/Training Program

## 2023-03-18 NOTE — Telephone Encounter (Signed)
PT stated that her oxycodone needs to be send to [facility] [address] Please give patient. TY

## 2023-05-21 DIAGNOSIS — I10 Essential (primary) hypertension: Secondary | ICD-10-CM | POA: Diagnosis not present

## 2023-05-21 DIAGNOSIS — J209 Acute bronchitis, unspecified: Secondary | ICD-10-CM | POA: Diagnosis not present

## 2023-05-21 DIAGNOSIS — N390 Urinary tract infection, site not specified: Secondary | ICD-10-CM | POA: Diagnosis not present

## 2023-05-29 DIAGNOSIS — Z1151 Encounter for screening for human papillomavirus (HPV): Secondary | ICD-10-CM | POA: Diagnosis not present

## 2023-05-29 DIAGNOSIS — Z124 Encounter for screening for malignant neoplasm of cervix: Secondary | ICD-10-CM | POA: Diagnosis not present

## 2023-05-29 DIAGNOSIS — N952 Postmenopausal atrophic vaginitis: Secondary | ICD-10-CM | POA: Diagnosis not present

## 2023-05-29 DIAGNOSIS — R8279 Other abnormal findings on microbiological examination of urine: Secondary | ICD-10-CM | POA: Diagnosis not present

## 2023-05-29 DIAGNOSIS — Z01419 Encounter for gynecological examination (general) (routine) without abnormal findings: Secondary | ICD-10-CM | POA: Diagnosis not present

## 2023-05-29 DIAGNOSIS — N762 Acute vulvitis: Secondary | ICD-10-CM | POA: Diagnosis not present

## 2023-06-04 ENCOUNTER — Ambulatory Visit
Payer: Medicare HMO | Attending: Student in an Organized Health Care Education/Training Program | Admitting: Student in an Organized Health Care Education/Training Program

## 2023-06-04 ENCOUNTER — Encounter: Payer: Self-pay | Admitting: Student in an Organized Health Care Education/Training Program

## 2023-06-04 VITALS — BP 159/94 | HR 91 | Temp 98.2°F | Ht 62.0 in | Wt 300.0 lb

## 2023-06-04 DIAGNOSIS — Z79899 Other long term (current) drug therapy: Secondary | ICD-10-CM | POA: Insufficient documentation

## 2023-06-04 DIAGNOSIS — Z5189 Encounter for other specified aftercare: Secondary | ICD-10-CM | POA: Diagnosis not present

## 2023-06-04 DIAGNOSIS — G8929 Other chronic pain: Secondary | ICD-10-CM | POA: Insufficient documentation

## 2023-06-04 DIAGNOSIS — M25561 Pain in right knee: Secondary | ICD-10-CM | POA: Insufficient documentation

## 2023-06-04 DIAGNOSIS — M47816 Spondylosis without myelopathy or radiculopathy, lumbar region: Secondary | ICD-10-CM | POA: Insufficient documentation

## 2023-06-04 DIAGNOSIS — M4316 Spondylolisthesis, lumbar region: Secondary | ICD-10-CM

## 2023-06-04 DIAGNOSIS — M25572 Pain in left ankle and joints of left foot: Secondary | ICD-10-CM | POA: Insufficient documentation

## 2023-06-04 DIAGNOSIS — M545 Low back pain, unspecified: Secondary | ICD-10-CM | POA: Diagnosis not present

## 2023-06-04 DIAGNOSIS — M25571 Pain in right ankle and joints of right foot: Secondary | ICD-10-CM | POA: Diagnosis not present

## 2023-06-04 DIAGNOSIS — Z79891 Long term (current) use of opiate analgesic: Secondary | ICD-10-CM | POA: Insufficient documentation

## 2023-06-04 DIAGNOSIS — E66813 Obesity, class 3: Secondary | ICD-10-CM | POA: Insufficient documentation

## 2023-06-04 DIAGNOSIS — M25562 Pain in left knee: Secondary | ICD-10-CM | POA: Diagnosis not present

## 2023-06-04 DIAGNOSIS — G894 Chronic pain syndrome: Secondary | ICD-10-CM | POA: Insufficient documentation

## 2023-06-04 DIAGNOSIS — Z6841 Body Mass Index (BMI) 40.0 and over, adult: Secondary | ICD-10-CM | POA: Insufficient documentation

## 2023-06-04 DIAGNOSIS — M4310 Spondylolisthesis, site unspecified: Secondary | ICD-10-CM | POA: Diagnosis not present

## 2023-06-04 DIAGNOSIS — E662 Morbid (severe) obesity with alveolar hypoventilation: Secondary | ICD-10-CM | POA: Diagnosis not present

## 2023-06-04 MED ORDER — HYDROCODONE-ACETAMINOPHEN 10-325 MG PO TABS: 1.0000 | ORAL_TABLET | Freq: Four times a day (QID) | ORAL | 0 refills | Status: DC | PRN

## 2023-06-04 NOTE — Patient Instructions (Signed)

## 2023-06-04 NOTE — Progress Notes (Signed)
Nursing Pain Medication Assessment:  Safety precautions to be maintained throughout the outpatient stay will include: orient to surroundings, keep bed in low position, maintain call bell within reach at all times, provide assistance with transfer out of bed and ambulation.  Medication Inspection Compliance: Pill count conducted under aseptic conditions, in front of the patient. Neither the pills nor the bottle was removed from the patient's sight at any time. Once count was completed pills were immediately returned to the patient in their original bottle.  Medication: Hydrocodone/APAP Pill/Patch Count:  60 of 120 pills remain Pill/Patch Appearance: Markings consistent with prescribed medication Bottle Appearance: Standard pharmacy container. Clearly labeled. Filled Date: 1 / 69 / 2025 Last Medication intake:  TodaySafety precautions to be maintained throughout the outpatient stay will include: orient to surroundings, keep bed in low position, maintain call bell within reach at all times, provide assistance with transfer out of bed and ambulation.

## 2023-06-04 NOTE — Progress Notes (Signed)
PROVIDER NOTE: Information contained herein reflects review and annotations entered in association with encounter. Interpretation of such information and data should be left to medically-trained personnel. Information provided to patient can be located elsewhere in the medical record under "Patient Instructions". Document created using STT-dictation technology, any transcriptional errors that may result from process are unintentional.    Patient: Alison Cox  Service Category: E/M  Provider: Edward Jolly, MD  DOB: 01-14-1956  DOS: 06/04/2023  Referring Provider: Thana Ates, MD  MRN: [id number]  Specialty: Interventional Pain Management  PCP: Thana Ates, MD  Type: Established Patient  Setting: Ambulatory outpatient    Location: Office  Delivery: Face-to-face     HPI  [name], a 68 y.o. year old female, is here today because of her Lumbar spondylosis [M47.816]. [name] primary complain today is Knee Pain (both)  Pertinent problems: 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 Pain Assessment: Severity of Chronic pain is reported as a 3  (while sitting)/10. Location: Knee Left, Right/Denies. Onset: More than a month ago. Quality: Aching, Burning, Constant, Throbbing, Stabbing. Timing: Constant. Modifying factor(s): sitting  down. Vitals:  height is 5\' 2"  (1.575 m) and weight is 300 lb (136.1 kg). Her temperature is 98.2 F (36.8 C). Her blood pressure is 159/94 (abnormal) and her pulse is 91. Her oxygen saturation is 95%.  BMI: Estimated body mass index is 54.87 kg/m as calculated from the following:   Height as of this encounter: 5\' 2"  (1.575 m).   Weight as of this encounter: 300 lb (136.1 kg). Last encounter: 09/11/2022. Last procedure: Visit date not found.  Reason for encounter: medication management.   No change in medical history since last visit.  Patient's pain is at baseline.  Patient continues multimodal pain regimen as prescribed.  States that it provides pain relief and improvement in functional status.   Pharmacotherapy Assessment  Analgesic: Hydrocodone 10 mg every 6 hours PRN pain  Monitoring: [place] PMP: PDMP reviewed during this encounter.       Pharmacotherapy: No side-effects or adverse reactions reported. Compliance: No problems identified. Effectiveness: Clinically acceptable.  Brigitte Pulse, RN  06/04/2023 10:00 AM  Sign when Signing Visit Nursing Pain Medication Assessment:  Safety precautions to be maintained throughout the outpatient stay will include: orient to surroundings, keep bed in low position, maintain call bell within reach at all times, provide assistance with transfer out of bed and ambulation.  Medication Inspection Compliance: Pill count conducted under aseptic conditions, in front of the patient. Neither the pills nor the bottle was removed from the patient's sight at any time. Once count was completed pills were immediately returned to the patient in their original bottle.  Medication: Hydrocodone/APAP Pill/Patch Count:  60 of 120 pills remain Pill/Patch Appearance: Markings consistent with prescribed medication Bottle Appearance: Standard pharmacy container. Clearly labeled. Filled Date: 1 / 41 / 2025 Last Medication intake:  TodaySafety precautions to be  maintained throughout the outpatient  stay will include: orient to surroundings, keep bed in low position, maintain call bell within reach at all times, provide assistance with transfer out of bed and ambulation.     No results found for: "CBDTHCR" No results found for: "D8THCCBX" No results found for: "D9THCCBX"  UDS:  Summary  Date Value Ref Range Status  03/12/2023 Note  Final    Comment:    ==================================================================== [facility] (MW) ==================================================================== Test                             Result       Flag       Units  Drug Present and Declared for Prescription Verification   Hydrocodone                    2220         EXPECTED   ng/mg creat   Hydromorphone                  658          EXPECTED   ng/mg creat   Dihydrocodeine                 428          EXPECTED   ng/mg creat   Norhydrocodone                 1433         EXPECTED   ng/mg creat    Sources of hydrocodone include scheduled prescription medications.    Hydromorphone, dihydrocodeine and norhydrocodone are expected    metabolites of hydrocodone. Hydromorphone and dihydrocodeine are    also available as scheduled prescription medications.  ==================================================================== Test                      Result    Flag   Units      Ref Range   Creatinine              245              mg/dL      >=81 ==================================================================== Declared Medications:  The flagging and interpretation on this report are based on the  following declared medications.  Unexpected results may arise from  inaccuracies in the declared medications.   **Note: The testing scope of this panel includes these medications:   Hydrocodone (Norco)   **Note: The testing scope of this panel does not include the  following reported medications:   Acetaminophen (Norco)  Albuterol  (Ventolin HFA)  Amlodipine (Norvasc)  Calcium  Cholecalciferol  Ciclopirox  Crisaborole  Doxycycline  Fluocinolone  Furosemide (Lasix)  Ketoconazole (Nizoral)  Levothyroxine (Synthroid)  Losartan (Cozaar)  Magnesium  Meloxicam (Mobic)  Nystatin  Prednisolone  Spironolactone  Triamcinolone ==================================================================== For clinical consultation, please call [phone]. ====================================================================       ROS  Constitutional: Denies any fever or chills Gastrointestinal: No reported hemesis, hematochezia, vomiting, or acute GI distress Musculoskeletal:  low back and bilateral knee pain Neurological: No reported episodes of acute onset apraxia, aphasia, dysarthria, agnosia, amnesia, paralysis, loss of coordination, or loss of consciousness  Medication Review  Cholecalciferol, Ciclopirox, Crisaborole, Fluocinolone Acetonide, HYDROcodone-acetaminophen, Magnesium Oxide -Mg Supplement, albuterol, amLODipine, calcium carbonate, doxycycline, furosemide, ketoconazole, levothyroxine, losartan, meloxicam, nystatin-triamcinolone, prednisoLONE acetate, and spironolactone  History Review  Allergy: [name] is allergic to penicillins, shellfish allergy, and sulfa antibiotics. Drug: Ms.  Alison Cox  reports no history of drug use. Alcohol:  reports no history of alcohol use. Tobacco:  reports that she has never smoked. She has never used smokeless tobacco. Social: [name]  reports that she has never smoked. She has never used smokeless tobacco. She reports that she does not drink alcohol and does not use drugs. 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 Surgical: [name]  has a past surgical history that includes Endometrial ablation; DILATATION AND CURETTAGE; Corneal transplant; and Corneal transplant. Family: family history includes Hypertension in her father and mother; Thyroid disease in her father.  Laboratory Chemistry Profile   Renal Lab Results  Component Value Date   BUN 15 02/12/2022   CREATININE 1.00 02/12/2022   BCR 15 02/12/2022   GFRAA 70 01/17/2019   GFRNONAA 61 01/17/2019    Hepatic Lab Results  Component Value Date   AST 27 02/12/2022   ALT 47 05/01/2016   ALBUMIN 4.8 02/12/2022   ALKPHOS 114 02/12/2022    Electrolytes Lab Results  Component Value Date   NA 141 02/12/2022   K 4.0 02/12/2022   CL 103 02/12/2022   CALCIUM 10.2 02/12/2022   MG 1.9 02/12/2022    Bone Lab Results  Component Value Date   25OHVITD1 30 02/12/2022   25OHVITD2 <1.0 02/12/2022   25OHVITD3 30 02/12/2022    Inflammation (CRP: Acute Phase) (ESR: Chronic Phase) Lab Results  Component Value Date   CRP 8 02/12/2022   ESRSEDRATE 22 02/12/2022         Note: Above Lab results reviewed.  Recent Imaging Review  US THYROID CLINICAL DATA:  Thyroid nodule  EXAM: THYROID ULTRASOUND  TECHNIQUE: Ultrasound examination of the thyroid gland and adjacent soft tissues was performed.  COMPARISON:  2017  FINDINGS: Parenchymal Echotexture: Mildly heterogenous  Isthmus: 0.6 cm  Right lobe: 4.6 x 1.8 x 1.7 cm  Left lobe: 3.4 x 1.6 x 1.4 cm  _________________________________________________________  Estimated total number of nodules >/= 1 cm: 2  Number of spongiform nodules >/=  2 cm not described below (TR1): 0  Number of mixed cystic and solid nodules >/= 1.5 cm not described below (TR2): 0  _________________________________________________________  Nodule labeled 1 is a previously biopsied solid nodule in the mid right thyroid gland measuring up to 1.6 cm, previously 2.0 cm in 2017. It appears slightly  decreased in size with similar morphologic features.  Nodule labeled 2 is a solid isoechoic TR 3 nodule in the inferior right thyroid lobe, not well visualized on previous exam, measuring 1.1 x 1.0 x 0.5 cm. Given size (<1.4 cm) and appearance, this nodule does NOT meet TI-RADS criteria for biopsy or dedicated follow-up.  IMPRESSION: 1. Multinodular thyroid gland. 2. Nodule labeled 1 was previously biopsied, and appears to demonstrate slight decrease in size since 2017. Correlate with biopsy results. 3. No other nodules on today's exam meet criteria for further dedicated follow-up or biopsy.  The above is in keeping with the ACR TI-RADS recommendations - J Am Coll Radiol 2017;14:587-595.  Electronically Signed   By: Olive Bass M.D.   On: 07/09/2022 15:29 Note: Reviewed        Physical Exam  General appearance: Well nourished, well developed, and well hydrated. In no apparent acute distress Mental status: Alert, oriented x 3 (person,  place, & time)       Respiratory: No evidence of acute respiratory distress Eyes: PERLA Vitals: BP (!) 159/94   Pulse 91   Temp 98.2 F (36.8 C)   Ht 5\' 2"  (1.575 m)   Wt 300 lb (136.1 kg)   SpO2 95%   BMI 54.87 kg/m  BMI: Estimated body mass index is 54.87 kg/m as calculated from the following:   Height as of this encounter: 5\' 2"  (1.575 m).   Weight as of this encounter: 300 lb (136.1 kg). Ideal: Ideal body weight: 50.1 kg (110 lb 7.2 oz) Adjusted ideal body weight: 84.5 kg (186 lb 4.3 oz)  B/L knee and LBP  Assessment   Diagnosis Status  1. Lumbar spondylosis   2. Grade 1 Anterolisthesis of L4 over L5   3. Class 3 obesity with alveolar hypoventilation, serious comorbidity, and body mass index (BMI) of 50.0 to 59.9 in adult (HCC)   4. Chronic use of opiate for therapeutic purpose   5. Chronic knee pain (1ry area of Pain) (Bilateral) (R>L)   6. Encounter for medication management   7. Chronic low back pain (3ry area of Pain)  (midline)   8. Chronic pain syndrome   9. Pharmacologic therapy   10. Encounter for therapeutic procedure   11. Chronic ankle pain (2ry area of Pain) (Bilateral) (L>R)      Controlled Controlled Controlled    Plan of Care   [name] has a current medication list which includes the following long-term medication(s): albuterol, amlodipine, calcium carbonate, furosemide, levothyroxine, losartan, spironolactone, cholecalciferol, [START ON 06/16/2023] hydrocodone-acetaminophen, [START ON 07/16/2023] hydrocodone-acetaminophen, [START ON 08/15/2023] hydrocodone-acetaminophen, magnesium oxide -mg supplement, and meloxicam.  Pharmacotherapy (Medications Ordered): Meds ordered this encounter  Medications   HYDROcodone-acetaminophen (NORCO) 10-325 MG tablet    Sig: Take 1 tablet by mouth every 6 (six) hours as needed for severe pain (pain score 7-10). Must last 30 days    Dispense:  120 tablet    Refill:  0    Chronic Pain: STOP Act (Not applicable) Fill 1 day early if closed on refill date.   HYDROcodone-acetaminophen (NORCO) 10-325 MG tablet    Sig: Take 1 tablet by mouth every 6 (six) hours as needed for severe pain (pain score 7-10). Must last 30 days    Dispense:  120 tablet    Refill:  0    Chronic Pain: STOP Act (Not applicable) Fill 1 day early if closed on refill date.   HYDROcodone-acetaminophen (NORCO) 10-325 MG tablet    Sig: Take 1 tablet by mouth every 6 (six) hours as needed for severe pain (pain score 7-10). Must last 30 days    Dispense:  120 tablet    Refill:  0    Chronic Pain: STOP Act (Not applicable) Fill 1 day early if closed on refill date.     Follow-up plan:   Return in about 14 weeks (around 09/10/2023) for MM, F2F, add to NP list.      Recent Visits Date Type Provider Dept  03/12/23 Office Visit Edward Jolly, MD [facility]  Showing recent visits within past 90 days and meeting all other requirements Today's Visits Date Type Provider Dept   06/04/23 Office Visit Edward Jolly, MD [facility]  Showing today's visits and meeting all other requirements Future Appointments No visits were found meeting these conditions. Showing future appointments within next 90 days and meeting all other requirements  I discussed the assessment and treatment plan  with the patient. The patient was provided an opportunity to ask questions and all were answered. The patient agreed with the plan and demonstrated an understanding of the instructions.  Patient advised to call back or seek an in-person evaluation if the symptoms or condition worsens.  Duration of encounter: .  Total time on encounter, as per AMA guidelines included both the face-to-face and non-face-to-face time personally spent by the physician and/or other qualified health care professional(s) on the day of the encounter (includes time in activities that require the physician or other qualified health care professional and does not include time in activities normally performed by clinical staff). Physician's time may include the following activities when performed: Preparing to see the patient (e.g., pre-charting review of records, searching for previously ordered imaging, lab work, and nerve conduction tests) Review of prior analgesic pharmacotherapies. Reviewing PMP Interpreting ordered tests (e.g., lab work, imaging, nerve conduction tests) Performing post-procedure evaluations, including interpretation of diagnostic procedures Obtaining and/or reviewing separately obtained history Performing a medically appropriate examination and/or evaluation Counseling and educating the patient/family/caregiver Ordering medications, tests, or procedures Referring and communicating with other health care professionals (when not separately reported) Documenting clinical information in the electronic or other health record Independently interpreting results (not separately  reported) and communicating results to the patient/ family/caregiver Care coordination (not separately reported)  Note by: Edward Jolly, MD Date: 06/04/2023; Time: 10:22 AM

## 2023-06-11 ENCOUNTER — Encounter: Payer: Medicare PPO | Admitting: Student in an Organized Health Care Education/Training Program

## 2023-06-16 ENCOUNTER — Ambulatory Visit: Payer: Medicare PPO | Admitting: Podiatry

## 2023-06-29 DIAGNOSIS — Z1231 Encounter for screening mammogram for malignant neoplasm of breast: Secondary | ICD-10-CM | POA: Diagnosis not present

## 2023-07-02 ENCOUNTER — Encounter: Payer: Self-pay | Admitting: Podiatry

## 2023-07-02 ENCOUNTER — Ambulatory Visit: Payer: Medicare HMO | Admitting: Podiatry

## 2023-07-02 DIAGNOSIS — M79674 Pain in right toe(s): Secondary | ICD-10-CM

## 2023-07-02 DIAGNOSIS — B351 Tinea unguium: Secondary | ICD-10-CM

## 2023-07-02 DIAGNOSIS — M79675 Pain in left toe(s): Secondary | ICD-10-CM

## 2023-07-02 NOTE — Progress Notes (Signed)
  Subjective:  Patient ID: Alison Cox, female    DOB: [birth year],  MRN: [id number]  Chief Complaint  Patient presents with   Callouses    Pt is here for routine foot care upon assessment noted that she has a callous on left great toe. She states the ulcer on left midfoot has healed. She has no other complaints.    68 y.o. female presents with the above complaint. History confirmed with patient. Patient presenting with pain related to dystrophic thickened elongated nails. Patient is unable to trim own nails related to nail dystrophy and/or mobility issues. Patient does not have a history of T2DM.   Objective:  Physical Exam: warm, good capillary refill nail exam onychomycosis of the toenails, onycholysis, and dystrophic nails DP pulses palpable, PT pulses palpable, and protective sensation intact Left Foot:  Pain with palpation of nails due to elongation and dystrophic growth.  Site of prior left dorsal midfoot ulcer appears well-healed Right Foot: Pain with palpation of nails due to elongation and dystrophic growth.   Assessment:   1. Pain due to onychomycosis of toenails of both feet      Plan:  Patient was evaluated and treated and all questions answered.   #Onychomycosis with pain  -Nails palliatively debrided as below. -Educated on self-care  Procedure: Nail Debridement Rationale: Pain Type of Debridement: manual, sharp debridement. Instrumentation: Nail nipper, rotary burr. Number of Nails: 10  Return in about 3 months (around 10/02/2023) for Routine Foot Care.         Bronwen Betters, DPM [facility] / [facility]

## 2023-08-31 NOTE — Progress Notes (Unsigned)
 PROVIDER NOTE: Interpretation of information contained herein should be left to medically-trained personnel. Specific patient instructions are provided elsewhere under "Patient Instructions" section of medical record. This document was created in part using AI and STT-dictation technology, any transcriptional errors that may result from this process are unintentional.  Patient: Alison Cox  Service: E/M   PCP: Tena Feeling, MD  DOB: [birth year]  DOS: 09/01/2023  Provider: Cherylin Corrigan, NP  MRN: [id number]  Delivery: Face-to-face  Specialty: Interventional Pain Management  Type: Established Patient  Setting: Ambulatory outpatient facility  Specialty designation: 09  Referring Prov.: Tena Feeling, MD  Location: Outpatient office facility       HPI  Alison Cox, a 68 y.o. year old female, is here today because of her No primary diagnosis found.. Alison Cox primary complain today is Right knee pain and low back pain    Pain Assessment: Severity of Chronic pain is reported as a 3 /10. Location: Knee (lower back right is worse) Left, Right/back pain can go into right hip. Onset: More than a month ago. Quality: Constant, Discomfort, Aching, Sharp, Radiating. Timing: Constant. Modifying factor(s): heat, warm showers, rest with feet elevated.  medications. Vitals:  height is 5\' 3"  (1.6 m) and weight is 300 lb (136.1 kg). Her temporal temperature is 98.4 F (36.9 C). Her blood pressure is 125/66 and her pulse is 99. Her respiration is 16 and oxygen saturation is 94%.  BMI: Estimated body mass index is 53.14 kg/m as calculated from the following:   Height as of this encounter: 5\' 3"  (1.6 m).   Weight as of this encounter: 300 lb (136.1 kg). Last encounter: 06/04/2023.  Reason for encounter: medication management. No change in medical history since last visit.  Patient's pain is at baseline.  Patient continues multimodal pain regimen as prescribed.  States that it provides pain relief and improvement  in functional status.   Pharmacotherapy Assessment  Analgesic: Hydrocodone -acetaminophen  (Norco) 10-325 mg tablet every 6 hours as needed for severe pain.  MME equals to 40 Monitoring: [place] PMP: PDMP reviewed during this encounter.       Pharmacotherapy: No side-effects or adverse reactions reported. Compliance: No problems identified. Effectiveness: Clinically acceptable.  Malon Seamen, RN  09/01/2023  1:03 PM  Sign when Signing Visit Nursing Pain Medication Assessment:  Safety precautions to be maintained throughout the outpatient stay will include: orient to surroundings, keep bed in low position, maintain call bell within reach at all times, provide assistance with transfer out of bed and ambulation.  Medication Inspection Compliance: Pill count conducted under aseptic conditions, in front of the patient. Neither the pills nor the bottle was removed from the patient's sight at any time. Once count was completed pills were immediately returned to the patient in their original bottle.  Medication: Hydrocodone /APAP Pill/Patch Count: 60 of 120 pills/patches remain Pill/Patch Appearance: Markings consistent with prescribed medication Bottle Appearance: Standard pharmacy container. Clearly labeled. Filled Date: 04 / 26 / 2025 Last Medication intake:  Today    No results found for: "CBDTHCR" No results found for: "D8THCCBX" No results found for: "D9THCCBX"  UDS:  Summary  Date Value Ref Range Status  03/12/2023 Note  Final    Comment:    ==================================================================== [facility] (MW) ==================================================================== Test                             Result  Flag       Units  Drug Present and Declared for Prescription Verification   Hydrocodone                     2220         EXPECTED   ng/mg creat   Hydromorphone                  658          EXPECTED   ng/mg creat   Dihydrocodeine                  428          EXPECTED   ng/mg creat   Norhydrocodone                 1433         EXPECTED   ng/mg creat    Sources of hydrocodone  include scheduled prescription medications.    Hydromorphone, dihydrocodeine and norhydrocodone are expected    metabolites of hydrocodone . Hydromorphone and dihydrocodeine are    also available as scheduled prescription medications.  ==================================================================== Test                      Result    Flag   Units      Ref Range   Creatinine              245              mg/dL      >=16 ==================================================================== Declared Medications:  The flagging and interpretation on this report are based on the  following declared medications.  Unexpected results may arise from  inaccuracies in the declared medications.   **Note: The testing scope of this panel includes these medications:   Hydrocodone  (Norco)   **Note: The testing scope of this panel does not include the  following reported medications:   Acetaminophen  (Norco)  Albuterol (Ventolin HFA)  Amlodipine (Norvasc)  Calcium  Cholecalciferol   Ciclopirox  Crisaborole  Doxycycline   Fluocinolone   Furosemide (Lasix)  Ketoconazole  (Nizoral )  Levothyroxine (Synthroid)  Losartan (Cozaar)  Magnesium   Meloxicam  (Mobic )  Nystatin  Prednisolone  Spironolactone  Triamcinolone ==================================================================== For clinical consultation, please call [phone]. ====================================================================       ROS  Constitutional: Denies any fever or chills Gastrointestinal: No reported hemesis, hematochezia, vomiting, or acute GI distress Musculoskeletal: low back pain, right knee pain  Neurological: No reported episodes of acute onset apraxia, aphasia, dysarthria, agnosia, amnesia, paralysis, loss of coordination, or loss of consciousness  Medication  Review  Cholecalciferol , Ciclopirox, Crisaborole, Fluocinolone  Acetonide, HYDROcodone -acetaminophen , Magnesium  Oxide -Mg Supplement, albuterol, amLODipine, calcium carbonate, doxycycline , estradiol, furosemide, ketoconazole , levothyroxine, losartan, meloxicam , nystatin-triamcinolone, prednisoLONE acetate, and spironolactone  History Review  Allergy: Ms. Whitescarver is allergic to penicillins, shellfish allergy, and sulfa antibiotics. Drug: Ms. Ryser  reports no history of drug use. Alcohol:  reports no history of alcohol use. Tobacco:  reports that she has never smoked. She has never used smokeless tobacco. Social: Ms. Lombardi  reports that she has never smoked. She has never used smokeless tobacco. She reports that she does not drink alcohol and does not use drugs. 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 Surgical: Ms. Funai  has a past surgical history that includes  Endometrial ablation; DILATATION AND CURETTAGE; Corneal transplant; and Corneal transplant. Family: family history includes Hypertension in her father and mother; Thyroid  disease in her father.  Laboratory Chemistry Profile   Renal Lab Results  Component Value Date   BUN 15 02/12/2022   CREATININE 1.00 02/12/2022   BCR 15 02/12/2022   GFRAA 70 01/17/2019   GFRNONAA 61 01/17/2019    Hepatic Lab Results  Component Value Date   AST 27 02/12/2022   ALT 47 05/01/2016   ALBUMIN 4.8 02/12/2022   ALKPHOS 114 02/12/2022    Electrolytes Lab Results  Component Value Date   NA 141 02/12/2022   K 4.0 02/12/2022   CL 103 02/12/2022   CALCIUM 10.2 02/12/2022   MG 1.9 02/12/2022    Bone Lab Results  Component Value Date   25OHVITD1 30 02/12/2022   25OHVITD2 <1.0  02/12/2022   25OHVITD3 30 02/12/2022    Inflammation (CRP: Acute Phase) (ESR: Chronic Phase) Lab Results  Component Value Date   CRP 8 02/12/2022   ESRSEDRATE 22 02/12/2022         Note: Above Lab results reviewed.  Recent Imaging Review  US  THYROID  CLINICAL DATA:  Thyroid  nodule  EXAM: THYROID  ULTRASOUND  TECHNIQUE: Ultrasound examination of the thyroid  gland and adjacent soft tissues was performed.  COMPARISON:  2017  FINDINGS: Parenchymal Echotexture: Mildly heterogenous  Isthmus: 0.6 cm  Right lobe: 4.6 x 1.8 x 1.7 cm  Left lobe: 3.4 x 1.6 x 1.4 cm  _________________________________________________________  Estimated total number of nodules >/= 1 cm: 2  Number of spongiform nodules >/=  2 cm not described below (TR1): 0  Number of mixed cystic and solid nodules >/= 1.5 cm not described below (TR2): 0  _________________________________________________________  Nodule labeled 1 is a previously biopsied solid nodule in the mid right thyroid  gland measuring up to 1.6 cm, previously 2.0 cm in 2017. It appears slightly decreased in size with similar morphologic features.  Nodule labeled 2 is a solid isoechoic TR 3 nodule in the inferior right thyroid  lobe, not well visualized on previous exam, measuring 1.1 x 1.0 x 0.5 cm. Given size (<1.4 cm) and appearance, this nodule does NOT meet TI-RADS criteria for biopsy or dedicated follow-up.  IMPRESSION: 1. Multinodular thyroid  gland. 2. Nodule labeled 1 was previously biopsied, and appears to demonstrate slight decrease in size since 2017. Correlate with biopsy results. 3. No other nodules on today's exam meet criteria for further dedicated follow-up or biopsy.  The above is in keeping with the ACR TI-RADS recommendations - J Am Coll Radiol 2017;14:587-595.  Electronically Signed   By: Maryann Smalls M.D.   On: 07/09/2022 15:29 Note: Reviewed         Physical Exam  General appearance: Well nourished,  well developed, and well hydrated. In no apparent acute distress Mental status: Alert, oriented x 3 (person, place, & time)       Respiratory: No evidence of acute respiratory distress Eyes: PERLA Vitals: BP 125/66 (BP Location: Left Arm, Patient Position: Sitting, Cuff Size: Large)   Pulse 99   Temp 98.4 F (36.9 C) (Temporal)   Resp 16   Ht 5\' 3"  (1.6 m)   Wt 300 lb (136.1 kg)   SpO2 94%   BMI 53.14 kg/m  BMI: Estimated body mass index is 53.14 kg/m as calculated from the following:   Height as of this encounter: 5\' 3"  (1.6 m).   Weight as of this encounter: 300 lb (136.1 kg). Ideal: Ideal  body weight: 52.4 kg (115 lb 8.3 oz) Adjusted ideal body weight: 85.9 kg (189 lb 5 oz)  Assessment   Diagnosis Status  1. Lumbar spondylosis   2. Grade 1 Anterolisthesis of L4 over L5   3. Chronic use of opiate for therapeutic purpose   4. Chronic knee pain (1ry area of Pain) (Bilateral) (R>L)   5. Encounter for medication management   6. Chronic low back pain (3ry area of Pain) (midline)   7. Chronic pain syndrome   8. Pharmacologic therapy   9. Encounter for therapeutic procedure   10. Chronic ankle pain (2ry area of Pain) (Bilateral) (L>R)    Controlled Controlled Controlled   Plan of Care  Assessment and Plan  We will continue on current medication regimen.  Prescribing drug monitoring (PDMP) consistent with prescribed medication.  Urine drug screening (UDS) up-to-date.  No other issues or problems reported to this visit.   Pharmacotherapy (Medications Ordered): Meds ordered this encounter  Medications   HYDROcodone -acetaminophen  (NORCO) 10-325 MG tablet    Sig: Take 1 tablet by mouth every 6 (six) hours as needed for severe pain (pain score 7-10). Must last 30 days    Dispense:  120 tablet    Refill:  0    Chronic Pain: STOP Act (Not applicable) Fill 1 day early if closed on refill date.   HYDROcodone -acetaminophen  (NORCO) 10-325 MG tablet    Sig: Take 1 tablet by mouth  every 6 (six) hours as needed for severe pain (pain score 7-10). Must last 30 days    Dispense:  120 tablet    Refill:  0    Chronic Pain: STOP Act (Not applicable) Fill 1 day early if closed on refill date.   HYDROcodone -acetaminophen  (NORCO) 10-325 MG tablet    Sig: Take 1 tablet by mouth every 6 (six) hours as needed for severe pain (pain score 7-10). Must last 30 days    Dispense:  120 tablet    Refill:  0    Chronic Pain: STOP Act (Not applicable) Fill 1 day early if closed on refill date.   Orders:  No orders of the defined types were placed in this encounter.  Follow-up plan:   Return for (F2F), (MM), Marthe Slain NP.    Recent Visits Date Type Provider Dept  06/04/23 Office Visit Cephus Collin, MD [facility]  Showing recent visits within past 90 days and meeting all other requirements Today's Visits Date Type Provider Dept  09/01/23 Office Visit Mauria Asquith K, NP [facility]  Showing today's visits and meeting all other requirements Future Appointments No visits were found meeting these conditions. Showing future appointments within next 90 days and meeting all other requirements  I discussed the assessment and treatment plan with the patient. The patient was provided an opportunity to ask questions and all were answered. The patient agreed with the plan and demonstrated an understanding of the instructions.  Patient advised to call back or seek an in-person evaluation if the symptoms or condition worsens.  Duration of encounter: 30 minutes.  Total time on encounter, as per AMA guidelines included both the face-to-face and non-face-to-face time personally spent by the physician and/or other qualified health care professional(s) on the day of the encounter (includes time in activities that require the physician or other qualified health care professional and does not include time in activities normally performed by clinical staff). Physician's time may  include the following activities when performed: Preparing to see the patient (e.g., pre-charting  review of records, searching for previously ordered imaging, lab work, and nerve conduction tests) Review of prior analgesic pharmacotherapies. Reviewing PMP Interpreting ordered tests (e.g., lab work, imaging, nerve conduction tests) Performing post-procedure evaluations, including interpretation of diagnostic procedures Obtaining and/or reviewing separately obtained history Performing a medically appropriate examination and/or evaluation Counseling and educating the patient/family/caregiver Ordering medications, tests, or procedures Referring and communicating with other health care professionals (when not separately reported) Documenting clinical information in the electronic or other health record Independently interpreting results (not separately reported) and communicating results to the patient/ family/caregiver Care coordination (not separately reported)  Note by: Elliet Goodnow K Lula Michaux, NP (TTS and AI technology used. I apologize for any typographical errors that were not detected and corrected.) Date: 09/01/2023; Time: 2:42 PM

## 2023-09-01 ENCOUNTER — Ambulatory Visit
Payer: Medicare HMO | Attending: Student in an Organized Health Care Education/Training Program | Admitting: Nurse Practitioner

## 2023-09-01 ENCOUNTER — Encounter: Payer: Self-pay | Admitting: Nurse Practitioner

## 2023-09-01 DIAGNOSIS — Z5189 Encounter for other specified aftercare: Secondary | ICD-10-CM | POA: Insufficient documentation

## 2023-09-01 DIAGNOSIS — M25572 Pain in left ankle and joints of left foot: Secondary | ICD-10-CM | POA: Insufficient documentation

## 2023-09-01 DIAGNOSIS — G8929 Other chronic pain: Secondary | ICD-10-CM | POA: Diagnosis not present

## 2023-09-01 DIAGNOSIS — M4310 Spondylolisthesis, site unspecified: Secondary | ICD-10-CM | POA: Insufficient documentation

## 2023-09-01 DIAGNOSIS — G894 Chronic pain syndrome: Secondary | ICD-10-CM | POA: Diagnosis not present

## 2023-09-01 DIAGNOSIS — M545 Low back pain, unspecified: Secondary | ICD-10-CM | POA: Diagnosis not present

## 2023-09-01 DIAGNOSIS — M4316 Spondylolisthesis, lumbar region: Secondary | ICD-10-CM

## 2023-09-01 DIAGNOSIS — Z79899 Other long term (current) drug therapy: Secondary | ICD-10-CM | POA: Diagnosis not present

## 2023-09-01 DIAGNOSIS — M25562 Pain in left knee: Secondary | ICD-10-CM | POA: Insufficient documentation

## 2023-09-01 DIAGNOSIS — M47816 Spondylosis without myelopathy or radiculopathy, lumbar region: Secondary | ICD-10-CM | POA: Insufficient documentation

## 2023-09-01 DIAGNOSIS — Z79891 Long term (current) use of opiate analgesic: Secondary | ICD-10-CM | POA: Insufficient documentation

## 2023-09-01 DIAGNOSIS — M25561 Pain in right knee: Secondary | ICD-10-CM | POA: Diagnosis not present

## 2023-09-01 DIAGNOSIS — M25571 Pain in right ankle and joints of right foot: Secondary | ICD-10-CM | POA: Insufficient documentation

## 2023-09-01 MED ORDER — HYDROCODONE-ACETAMINOPHEN 10-325 MG PO TABS: 1.0000 | ORAL_TABLET | Freq: Four times a day (QID) | ORAL | 0 refills | Status: DC | PRN

## 2023-09-01 NOTE — Progress Notes (Signed)
 Nursing Pain Medication Assessment:  Safety precautions to be maintained throughout the outpatient stay will include: orient to surroundings, keep bed in low position, maintain call bell within reach at all times, provide assistance with transfer out of bed and ambulation.  Medication Inspection Compliance: Pill count conducted under aseptic conditions, in front of the patient. Neither the pills nor the bottle was removed from the patient's sight at any time. Once count was completed pills were immediately returned to the patient in their original bottle.  Medication: Hydrocodone /APAP Pill/Patch Count: 60 of 120 pills/patches remain Pill/Patch Appearance: Markings consistent with prescribed medication Bottle Appearance: Standard pharmacy container. Clearly labeled. Filled Date: 04 / 26 / 2025 Last Medication intake:  Today

## 2023-09-11 DIAGNOSIS — Z947 Corneal transplant status: Secondary | ICD-10-CM | POA: Diagnosis not present

## 2023-09-11 DIAGNOSIS — H18553 Macular corneal dystrophy, bilateral: Secondary | ICD-10-CM | POA: Diagnosis not present

## 2023-10-12 ENCOUNTER — Ambulatory Visit: Admitting: Podiatry

## 2023-10-30 ENCOUNTER — Encounter: Payer: Self-pay | Admitting: Podiatry

## 2023-10-30 ENCOUNTER — Ambulatory Visit: Admitting: Podiatry

## 2023-10-30 DIAGNOSIS — M79675 Pain in left toe(s): Secondary | ICD-10-CM | POA: Diagnosis not present

## 2023-10-30 DIAGNOSIS — M79674 Pain in right toe(s): Secondary | ICD-10-CM

## 2023-10-30 DIAGNOSIS — B351 Tinea unguium: Secondary | ICD-10-CM | POA: Diagnosis not present

## 2023-10-30 NOTE — Progress Notes (Unsigned)
  Subjective:  Patient ID: Alison Cox, female    DOB: Dec 18, 1955,  MRN: [id number]  Chief Complaint  Patient presents with   RFC    RFC non diabetic toenail trim.     68 y.o. female presents with the above complaint. History confirmed with patient. Patient presenting with pain related to dystrophic thickened elongated nails. Patient is unable to trim own nails related to nail dystrophy and/or mobility issues. Patient does not have a history of T2DM.   Objective:  Physical Exam: warm, good capillary refill nail exam onychomycosis of the toenails, onycholysis, and dystrophic nails DP pulses palpable, PT pulses palpable, and protective sensation intact Left Foot:  Pain with palpation of nails due to elongation and dystrophic growth.  Right Foot: Pain with palpation of nails due to elongation and dystrophic growth.   Assessment:   1. Pain due to onychomycosis of toenails of both feet      Plan:  Patient was evaluated and treated and all questions answered.   #Onychomycosis with pain  -Nails palliatively debrided as below. -Educated on self-care  Procedure: Nail Debridement Rationale: Pain Type of Debridement: manual, sharp debridement. Instrumentation: Nail nipper, rotary burr. Number of Nails: 10  Return in about 3 months (around 01/30/2024) for Routine Foot Care.         Ethan Saddler, DPM [facility] / [facility]

## 2023-11-23 ENCOUNTER — Encounter: Payer: Self-pay | Admitting: Nurse Practitioner

## 2023-11-23 ENCOUNTER — Ambulatory Visit: Attending: Nurse Practitioner | Admitting: Nurse Practitioner

## 2023-11-23 DIAGNOSIS — G8929 Other chronic pain: Secondary | ICD-10-CM | POA: Insufficient documentation

## 2023-11-23 DIAGNOSIS — Z79899 Other long term (current) drug therapy: Secondary | ICD-10-CM | POA: Diagnosis not present

## 2023-11-23 DIAGNOSIS — M25561 Pain in right knee: Secondary | ICD-10-CM | POA: Diagnosis not present

## 2023-11-23 DIAGNOSIS — Z5189 Encounter for other specified aftercare: Secondary | ICD-10-CM | POA: Insufficient documentation

## 2023-11-23 DIAGNOSIS — M25571 Pain in right ankle and joints of right foot: Secondary | ICD-10-CM | POA: Diagnosis not present

## 2023-11-23 DIAGNOSIS — M4316 Spondylolisthesis, lumbar region: Secondary | ICD-10-CM

## 2023-11-23 DIAGNOSIS — M25572 Pain in left ankle and joints of left foot: Secondary | ICD-10-CM | POA: Insufficient documentation

## 2023-11-23 DIAGNOSIS — G894 Chronic pain syndrome: Secondary | ICD-10-CM | POA: Diagnosis not present

## 2023-11-23 DIAGNOSIS — M545 Low back pain, unspecified: Secondary | ICD-10-CM | POA: Diagnosis not present

## 2023-11-23 DIAGNOSIS — M25562 Pain in left knee: Secondary | ICD-10-CM | POA: Diagnosis not present

## 2023-11-23 DIAGNOSIS — M4310 Spondylolisthesis, site unspecified: Secondary | ICD-10-CM | POA: Insufficient documentation

## 2023-11-23 DIAGNOSIS — Z79891 Long term (current) use of opiate analgesic: Secondary | ICD-10-CM | POA: Insufficient documentation

## 2023-11-23 DIAGNOSIS — M47816 Spondylosis without myelopathy or radiculopathy, lumbar region: Secondary | ICD-10-CM | POA: Insufficient documentation

## 2023-11-23 MED ORDER — HYDROCODONE-ACETAMINOPHEN 10-325 MG PO TABS: 1.0000 | ORAL_TABLET | Freq: Four times a day (QID) | ORAL | 0 refills | Status: DC | PRN

## 2023-11-23 NOTE — Progress Notes (Signed)
 Nursing Pain Medication Assessment:  Safety precautions to be maintained throughout the outpatient stay will include: orient to surroundings, keep bed in low position, maintain call bell within reach at all times, provide assistance with transfer out of bed and ambulation.  Medication Inspection Compliance: Pill count conducted under aseptic conditions, in front of the patient. Neither the pills nor the bottle was removed from the patient's sight at any time. Once count was completed pills were immediately returned to the patient in their original bottle.  Medication: Hydrocodone /APAP Pill/Patch Count: 86 of 120 pills/patches remain Pill/Patch Appearance: Markings consistent with prescribed medication Bottle Appearance: Standard pharmacy container. Clearly labeled. Filled Date: 7 / 58 / 2025 Last Medication intake:  TodaySafety precautions to be maintained throughout the outpatient stay will include: orient to surroundings, keep bed in low position, maintain call bell within reach at all times, provide assistance with transfer out of bed and ambulation.

## 2023-11-23 NOTE — Progress Notes (Signed)
 PROVIDER NOTE: Interpretation of information contained herein should be left to medically-trained personnel. Specific patient instructions are provided elsewhere under Patient Instructions section of medical record. This document was created in part using AI and STT-dictation technology, any transcriptional errors that may result from this process are unintentional.  Patient: Alison Cox  Service: E/M   PCP: Dwight Trula SQUIBB, MD  DOB: 06-22-1955  DOS: 11/23/2023  Provider: Emmy MARLA Blanch, NP  MRN: [id number]  Delivery: Face-to-face  Specialty: Interventional Pain Management  Type: Established Patient  Setting: Ambulatory outpatient facility  Specialty designation: 09  Referring Prov.: Dwight Trula SQUIBB, MD  Location: Outpatient office facility       History of present illness (HPI) [name], a 68 y.o. year old female, is here today because of her No primary diagnosis found.. Ms. Ocon primary complain today is Knee Pain (Both/)  Pertinent problems: Ms. Fuqua has Long-term current use of opioid analgesics; Long-term prescription opioid use; Opioid use (40 MME per day); Opioid dependence (HCC); Lumbar spondylosis; Grade 1 anterolisthesis of L4 over L5; Chronic knee pain (1ry area of pain) (Bilateral) (R>L); Osteoarthritis of knees (Bilateral) (R>L); History of Hashimoto thyroiditis; Osteoarthrosis; Spondylosis of lumbosacral region (advanced disc degeneration and spondylosis L5-S1); and Chronic pain syndrome on their pertinent problem list.  Pain Assessment: Severity of   is reported as a 0-No pain/10. Location:    / . Onset:  . Quality:  . Timing:  . Modifying factor(s):  SABRA Vitals:  height is 5' 3 (1.6 m) and weight is 300 lb (136.1 kg). Her temperature is 98.2 F (36.8 C). Her blood pressure is 161/94 (abnormal). Her oxygen saturation is 100%.  BMI: Estimated body mass index is 53.14 kg/m as calculated from the following:   Height as of this encounter: 5' 3 (1.6 m).   Weight as of this  encounter: 300 lb (136.1 kg).  Last encounter: 09/01/2023. Last procedure: Visit date not found.  Reason for encounter: medication management. No change in medical history since last visit.  Patient's pain is at baseline.  Patient continues multimodal pain regimen as prescribed.  States that it provides pain relief and improvement in functional status.   Pharmacotherapy Assessment   Hydrocodone -acetaminophen  (Norco) 10-325 mg tablet every 6 hours as needed for severe pain.  MME equals to 40 Monitoring: [place] PMP: PDMP reviewed during this encounter.       Pharmacotherapy: No side-effects or adverse reactions reported. Compliance: No problems identified. Effectiveness: Clinically acceptable.  Delores Dorothe LABOR, RN  11/23/2023  1:31 PM  Sign when Signing Visit Nursing Pain Medication Assessment:  Safety precautions to be maintained throughout the outpatient stay will include: orient to surroundings, keep bed in low position, maintain call bell within reach at all times, provide assistance with transfer out of bed and ambulation.  Medication Inspection Compliance: Pill count conducted under aseptic conditions, in front of the patient. Neither the pills nor the bottle was removed from the patient's sight at any time. Once count was completed pills were immediately returned to the patient in their original bottle.  Medication: Hydrocodone /APAP Pill/Patch Count: 86 of 120 pills/patches remain Pill/Patch Appearance: Markings consistent with prescribed medication Bottle Appearance: Standard pharmacy container. Clearly labeled. Filled Date: 7 / 60 / 2025 Last Medication intake:  TodaySafety precautions to be maintained throughout the outpatient stay will include: orient to surroundings, keep bed in low position, maintain call bell within reach at all times, provide assistance with transfer out of bed and ambulation.  UDS:  Summary  Date Value Ref Range Status  03/12/2023 Note  Final    Comment:     ==================================================================== [facility] Select 13 (MW) ==================================================================== Test                             Result       Flag       Units  Drug Present and Declared for Prescription Verification   Hydrocodone                     2220         EXPECTED   ng/mg creat   Hydromorphone                  658          EXPECTED   ng/mg creat   Dihydrocodeine                 428          EXPECTED   ng/mg creat   Norhydrocodone                 1433         EXPECTED   ng/mg creat    Sources of hydrocodone  include scheduled prescription medications.    Hydromorphone, dihydrocodeine and norhydrocodone are expected    metabolites of hydrocodone . Hydromorphone and dihydrocodeine are    also available as scheduled prescription medications.  ==================================================================== Test                      Result    Flag   Units      Ref Range   Creatinine              245              mg/dL      >=79 ==================================================================== Declared Medications:  The flagging and interpretation on this report are based on the  following declared medications.  Unexpected results may arise from  inaccuracies in the declared medications.   **Note: The testing scope of this panel includes these medications:   Hydrocodone  (Norco)   **Note: The testing scope of this panel does not include the  following reported medications:   Acetaminophen  (Norco)  Albuterol (Ventolin HFA)  Amlodipine (Norvasc)  Calcium  Cholecalciferol   Ciclopirox  Crisaborole  Doxycycline   Fluocinolone   Furosemide (Lasix)  Ketoconazole  (Nizoral )  Levothyroxine (Synthroid)  Losartan (Cozaar)  Magnesium   Meloxicam  (Mobic )  Nystatin  Prednisolone  Spironolactone  Triamcinolone ==================================================================== For clinical consultation, please  call [phone]. ====================================================================     No results found for: CBDTHCR No results found for: D8THCCBX No results found for: D9THCCBX  ROS  Constitutional: Denies any fever or chills Gastrointestinal: No reported hemesis, hematochezia, vomiting, or acute GI distress Musculoskeletal: Bilateral knee pain Neurological: No reported episodes of acute onset apraxia, aphasia, dysarthria, agnosia, amnesia, paralysis, loss of coordination, or loss of consciousness  Medication Review  Cholecalciferol , Ciclopirox, Crisaborole, Fluocinolone  Acetonide, HYDROcodone -acetaminophen , Magnesium  Oxide -Mg Supplement, albuterol, amLODipine, calcium carbonate, doxycycline , estradiol, furosemide, ketoconazole , levothyroxine, losartan, meloxicam , nystatin-triamcinolone, prednisoLONE acetate, and spironolactone  History Review  Allergy: Ms. Lynne is allergic to penicillins, shellfish allergy, and sulfa antibiotics. Drug: Ms. Bristow  reports no history of drug use. Alcohol:  reports no history of alcohol use. Tobacco:  reports that she has never smoked. She has never used smokeless tobacco. Social: Ms. Funez  reports that  she has never smoked. She has never used smokeless tobacco. She reports that she does not drink alcohol and does not use drugs. 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 Surgical: Ms. Harada  has a past surgical history that includes Endometrial ablation; DILATATION AND CURETTAGE; Corneal transplant; and Corneal transplant. Family: family history includes Hypertension in her father and mother; Thyroid  disease in her father.  Laboratory Chemistry Profile   Renal Lab  Results  Component Value Date   BUN 15 02/12/2022   CREATININE 1.00 02/12/2022   BCR 15 02/12/2022   GFRAA 70 01/17/2019   GFRNONAA 61 01/17/2019    Hepatic Lab Results  Component Value Date   AST 27 02/12/2022   ALT 47 05/01/2016   ALBUMIN 4.8 02/12/2022   ALKPHOS 114 02/12/2022    Electrolytes Lab Results  Component Value Date   NA 141 02/12/2022   K 4.0 02/12/2022   CL 103 02/12/2022   CALCIUM 10.2 02/12/2022   MG 1.9 02/12/2022    Bone Lab Results  Component Value Date   25OHVITD1 30 02/12/2022   25OHVITD2 <1.0 02/12/2022   25OHVITD3 30 02/12/2022    Inflammation (CRP: Acute Phase) (ESR: Chronic Phase) Lab Results  Component Value Date   CRP 8 02/12/2022   ESRSEDRATE 22 02/12/2022         Note: Above Lab results reviewed.  Recent Imaging Review  US  THYROID  CLINICAL DATA:  Thyroid  nodule  EXAM: THYROID  ULTRASOUND  TECHNIQUE: Ultrasound examination of the thyroid  gland and adjacent soft tissues was performed.  COMPARISON:  2017  FINDINGS: Parenchymal Echotexture: Mildly heterogenous  Isthmus: 0.6 cm  Right lobe: 4.6 x 1.8 x 1.7 cm  Left lobe: 3.4 x 1.6 x 1.4 cm  _________________________________________________________  Estimated total number of nodules >/= 1 cm: 2  Number of spongiform nodules >/=  2 cm not described below (TR1): 0  Number of mixed cystic and solid nodules >/= 1.5 cm not described below (TR2): 0  _________________________________________________________  Nodule labeled 1 is a previously biopsied solid nodule in the mid right thyroid  gland measuring up to 1.6 cm, previously 2.0 cm in 2017. It appears slightly decreased in size with similar morphologic features.  Nodule labeled 2 is a solid isoechoic TR 3 nodule in the inferior right thyroid  lobe, not well visualized on previous exam, measuring 1.1 x 1.0 x 0.5 cm. Given size (<1.4 cm) and appearance, this nodule does NOT meet TI-RADS criteria for biopsy or dedicated  follow-up.  IMPRESSION: 1. Multinodular thyroid  gland. 2. Nodule labeled 1 was previously biopsied, and appears to demonstrate slight decrease in size since 2017. Correlate with biopsy results. 3. No other nodules on today's exam meet criteria for further dedicated follow-up or biopsy.  The above is in keeping with the ACR TI-RADS recommendations - J Am Coll Radiol 2017;14:587-595.  Electronically Signed   By: Felton Fry M.D.   On: 07/09/2022 15:29 Note: Reviewed        Physical Exam  Vitals: BP (!) 161/94   Temp 98.2 F (36.8 C)   Ht 5' 3 (1.6 m)   Wt 300 lb (136.1 kg)   SpO2 100%   BMI 53.14 kg/m  BMI: Estimated body mass index is 53.14 kg/m as calculated from the following:   Height as of this encounter: 5' 3 (  1.6 m).   Weight as of this encounter: 300 lb (136.1 kg). Ideal: Ideal body weight: 52.4 kg (115 lb 8.3 oz) Adjusted ideal body weight: 85.9 kg (189 lb 5 oz) General appearance: Well nourished, well developed, and well hydrated. In no apparent acute distress Mental status: Alert, oriented x 3 (person, place, & time)       Respiratory: No evidence of acute respiratory distress Eyes: PERLA   Assessment   Diagnosis Status  1. Lumbar spondylosis   2. Grade 1 Anterolisthesis of L4 over L5   3. Chronic use of opiate for therapeutic purpose   4. Chronic knee pain (1ry area of Pain) (Bilateral) (R>L)   5. Encounter for medication management   6. Chronic low back pain (3ry area of Pain) (midline)   7. Chronic pain syndrome   8. Pharmacologic therapy   9. Encounter for therapeutic procedure   10. Chronic ankle pain (2ry area of Pain) (Bilateral) (L>R)    Controlled Controlled Controlled   Updated Problems: No problems updated.  Plan of Care  Problem-specific:  Assessment and Plan  We will continue on current medication regimen.  Prescribing drug monitoring (PDMP) consistent with prescribed medication and no evidence of narcotic misuse or abuse.  Urine drug screening (UDS) up-to-date.  No other issues or problems reported to this visit. Schedule follow up in 90 days for medication management.   Ms. Reena R Amadon has a current medication list which includes the following long-term medication(s): albuterol, amlodipine, calcium carbonate, cholecalciferol , furosemide, hydrocodone -acetaminophen , levothyroxine, losartan, magnesium  oxide -mg supplement, meloxicam , and spironolactone.  Pharmacotherapy (Medications Ordered): No orders of the defined types were placed in this encounter.  Orders:  No orders of the defined types were placed in this encounter.       No follow-ups on file.    Recent Visits Date Type Provider Dept  09/01/23 Office Visit Fredis Malkiewicz K, NP [facility]  Showing recent visits within past 90 days and meeting all other requirements Today's Visits Date Type Provider Dept  11/23/23 Office Visit Montoya Brandel K, NP [facility]  Showing today's visits and meeting all other requirements Future Appointments No visits were found meeting these conditions. Showing future appointments within next 90 days and meeting all other requirements  I discussed the assessment and treatment plan with the patient. The patient was provided an opportunity to ask questions and all were answered. The patient agreed with the plan and demonstrated an understanding of the instructions.  Patient advised to call back or seek an in-person evaluation if the symptoms or condition worsens.  Duration of encounter: 30 minutes.  Total time on encounter, as per AMA guidelines included both the face-to-face and non-face-to-face time personally spent by the physician and/or other qualified health care professional(s) on the day of the encounter (includes time in activities that require the physician or other qualified health care professional and does not include time in activities normally performed by clinical staff). Physician's  time may include the following activities when performed: Preparing to see the patient (e.g., pre-charting review of records, searching for previously ordered imaging, lab work, and nerve conduction tests) Review of prior analgesic pharmacotherapies. Reviewing PMP Interpreting ordered tests (e.g., lab work, imaging, nerve conduction tests) Performing post-procedure evaluations, including interpretation of diagnostic procedures Obtaining and/or reviewing separately obtained history Performing a medically appropriate examination and/or evaluation Counseling and educating the patient/family/caregiver Ordering medications, tests, or procedures Referring and communicating with other health care professionals (when not separately reported) Documenting  clinical information in the electronic or other health record Independently interpreting results (not separately reported) and communicating results to the patient/ family/caregiver Care coordination (not separately reported)  Note by: Laquinton Bihm K Kayline Sheer, NP (TTS and AI technology used. I apologize for any typographical errors that were not detected and corrected.) Date: 11/23/2023; Time: 1:50 PM

## 2023-12-02 ENCOUNTER — Encounter: Admitting: Nurse Practitioner

## 2023-12-14 DIAGNOSIS — Z79899 Other long term (current) drug therapy: Secondary | ICD-10-CM | POA: Diagnosis not present

## 2023-12-14 DIAGNOSIS — N390 Urinary tract infection, site not specified: Secondary | ICD-10-CM | POA: Diagnosis not present

## 2023-12-14 DIAGNOSIS — E559 Vitamin D deficiency, unspecified: Secondary | ICD-10-CM | POA: Diagnosis not present

## 2023-12-14 DIAGNOSIS — E782 Mixed hyperlipidemia: Secondary | ICD-10-CM | POA: Diagnosis not present

## 2024-01-03 ENCOUNTER — Other Ambulatory Visit: Payer: Self-pay | Admitting: Dermatology

## 2024-01-03 DIAGNOSIS — L719 Rosacea, unspecified: Secondary | ICD-10-CM

## 2024-01-13 ENCOUNTER — Ambulatory Visit (INDEPENDENT_AMBULATORY_CARE_PROVIDER_SITE_OTHER): Payer: Medicare PPO | Admitting: Dermatology

## 2024-01-13 ENCOUNTER — Encounter: Payer: Self-pay | Admitting: Dermatology

## 2024-01-13 DIAGNOSIS — Z79899 Other long term (current) drug therapy: Secondary | ICD-10-CM

## 2024-01-13 DIAGNOSIS — L719 Rosacea, unspecified: Secondary | ICD-10-CM | POA: Diagnosis not present

## 2024-01-13 DIAGNOSIS — L74519 Primary focal hyperhidrosis, unspecified: Secondary | ICD-10-CM

## 2024-01-13 DIAGNOSIS — R61 Generalized hyperhidrosis: Secondary | ICD-10-CM | POA: Diagnosis not present

## 2024-01-13 DIAGNOSIS — Z7189 Other specified counseling: Secondary | ICD-10-CM

## 2024-01-13 DIAGNOSIS — L219 Seborrheic dermatitis, unspecified: Secondary | ICD-10-CM | POA: Diagnosis not present

## 2024-01-13 MED ORDER — ALUMINUM CHLORIDE 20 % EX SOLN: CUTANEOUS | 11 refills | Status: AC

## 2024-01-13 MED ORDER — FLUOCINOLONE ACETONIDE 0.01 % OT OIL: 1.0000 [drp] | TOPICAL_OIL | OTIC | 11 refills | Status: AC | PRN

## 2024-01-13 MED ORDER — DOXYCYCLINE HYCLATE 100 MG PO TABS: 100.0000 mg | ORAL_TABLET | Freq: Every day | ORAL | 3 refills | Status: AC

## 2024-01-13 MED ORDER — KETOCONAZOLE 2 % EX SHAM: MEDICATED_SHAMPOO | CUTANEOUS | 11 refills | Status: AC

## 2024-01-13 NOTE — Patient Instructions (Addendum)
 Cont Ketoconazole  shampoo as directed  Cont Fluocinolone  oil as directed to ears   Cont Doxycycline  100 mg once a day with food   Doxycycline  should be taken with food to prevent nausea. Do not lay down for 30 minutes after taking. Be cautious with sun exposure and use good sun protection while on this medication. Pregnant women should not take this medication.    Start Drysol to underarms as directed Apply to both underarms every night at bedtime for 1 week then 2-3 nights per week as needed.   Recommend daily broad spectrum sunscreen SPF 30+ to sun-exposed areas, reapply every 2 hours as needed. Call for new or changing lesions.  Staying in the shade or wearing long sleeves, sun glasses (UVA+UVB protection) and wide brim hats (4-inch brim around the entire circumference of the hat) are also recommended for sun protection.     Due to recent changes in healthcare laws, you may see results of your pathology and/or laboratory studies on MyChart before the doctors have had a chance to review them. We understand that in some cases there may be results that are confusing or concerning to you. Please understand that not all results are received at the same time and often the doctors may need to interpret multiple results in order to provide you with the best plan of care or course of treatment. Therefore, we ask that you please give us  2 business days to thoroughly review all your results before contacting the office for clarification. Should we see a critical lab result, you will be contacted sooner.   If You Need Anything After Your Visit  If you have any questions or concerns for your doctor, please call our main line at [phone] and press option 4 to reach your doctor's medical assistant. If no one answers, please leave a voicemail as directed and we will return your call as soon as possible. Messages left after 4 pm will be answered the following business day.   You may also send us  a  message via MyChart. We typically respond to MyChart messages within 1-2 business days.  For prescription refills, please ask your pharmacy to contact our office. Our fax number is [phone].  If you have an urgent issue when the clinic is closed that cannot wait until the next business day, you can page your doctor at the number below.    Please note that while we do our best to be available for urgent issues outside of office hours, we are not available 24/7.   If you have an urgent issue and are unable to reach us , you may choose to seek medical care at your doctor's office, retail clinic, urgent care center, or emergency room.  If you have a medical emergency, please immediately call 911 or go to the emergency department.  Pager Numbers  - Dr. Hester: [phone]  - Dr. Jackquline: [phone]  - Dr. Claudene: [phone]   - Dr. Raymund: [phone]  In the event of inclement weather, please call our main line at [phone] for an update on the status of any delays or closures.  Dermatology Medication Tips: Please keep the boxes that topical medications come in in order to help keep track of the instructions about where and how to use these. Pharmacies typically print the medication instructions only on the boxes and not directly on the medication tubes.   If your medication is too expensive, please contact our office at [phone] option 4 or send us  a message through  MyChart.   We are unable to tell what your co-pay for medications will be in advance as this is different depending on your insurance coverage. However, we may be able to find a substitute medication at lower cost or fill out paperwork to get insurance to cover a needed medication.   If a prior authorization is required to get your medication covered by your insurance company, please allow us  1-2 business days to complete this process.  Drug prices often vary depending on where the prescription is filled and  some pharmacies may offer cheaper prices.  The website www.goodrx.com contains coupons for medications through different pharmacies. The prices here do not account for what the cost may be with help from insurance (it may be cheaper with your insurance), but the website can give you the price if you did not use any insurance.  - You can print the associated coupon and take it with your prescription to the pharmacy.  - You may also stop by our office during regular business hours and pick up a GoodRx coupon card.  - If you need your prescription sent electronically to a different pharmacy, notify our office through [facility] or by phone at [phone] option 4.     Si Usted Necesita Algo Despus de Su Visita  Tambin puede enviarnos un mensaje a travs de Clinical cytogeneticist. Por lo general respondemos a los mensajes de MyChart en el transcurso de 1 a 2 das hbiles.  Para renovar recetas, por favor pida a su farmacia que se ponga en contacto con nuestra oficina. Randi lakes de fax es [place] [phone].  Si tiene un asunto urgente cuando la clnica est cerrada y que no puede esperar hasta el siguiente da hbil, puede llamar/localizar a su doctor(a) al nmero que aparece a continuacin.   Por favor, tenga en cuenta que aunque hacemos todo lo posible para estar disponibles para asuntos urgentes fuera del horario de [place], no estamos disponibles las 24 horas del da, los 7 [address]  [address] de la [place].   Si tiene un problema urgente y no puede comunicarse con nosotros, puede optar por buscar atencin mdica  en el consultorio de su doctor(a), en una clnica privada, en un centro de atencin urgente o en una sala de emergencias.  Si tiene Engineer, drilling, por favor llame inmediatamente al 911 o vaya a la sala de emergencias.  Nmeros de bper  - Dr. Hester: [phone]  - Dra. Jackquline: [phone]  - Dr. Claudene: [phone]  - Dra. Kitts: [phone]  En caso de inclemencias del  [place], por favor llame a nuestra lnea principal al [phone] para una actualizacin sobre el estado de cualquier retraso o cierre.  Consejos para la medicacin en dermatologa: Por favor, guarde las cajas en las que vienen los medicamentos de uso tpico para ayudarle a seguir las instrucciones sobre dnde y cmo usarlos. Las farmacias generalmente imprimen las instrucciones del medicamento slo en las cajas y no directamente en los tubos del [place].   Si su medicamento es muy caro, por favor, pngase en contacto con landry rieger llamando al [phone] y presione la opcin 4 o envenos un mensaje a travs de Clinical cytogeneticist.   No podemos decirle cul ser su copago por los medicamentos por adelantado ya que esto es diferente dependiendo de la cobertura de su seguro. Sin embargo, es posible que podamos encontrar un medicamento sustituto a Audiological scientist un formulario para que el seguro cubra el medicamento que se considera necesario.   Si  se requiere una autorizacin previa para que su compaa de seguros malta su medicamento, por favor permtanos de 1 a 2 das hbiles para completar este proceso.  Los precios de los medicamentos varan con frecuencia dependiendo del Environmental consultant de dnde se surte la receta y alguna farmacias pueden ofrecer precios ms baratos.  El sitio web www.goodrx.com tiene cupones para medicamentos de Health and safety inspector. Los precios aqu no tienen en cuenta lo que podra costar con la ayuda del seguro (puede ser ms barato con su seguro), pero el sitio web puede darle el precio si no utiliz Tourist information centre manager.  - Puede imprimir el cupn correspondiente y llevarlo con su receta a la farmacia.  - Tambin puede pasar por nuestra oficina durante el horario de atencin regular y Education officer, museum una tarjeta de cupones de GoodRx.  - Si necesita que su receta se enve electrnicamente a una farmacia diferente, informe a nuestra oficina a travs de MyChart de [place] o por telfono  llamando al [phone] y presione la opcin 4.

## 2024-01-13 NOTE — Progress Notes (Signed)
 Follow-Up Visit   Subjective  Alison Cox is a 68 y.o. female who presents for the following: Rosacea, yearly check. Not currently using topical medications. Taking Doxycycline  100 mg daily. Tolerating well. Helping rosacea.   Yearly seb. derm follow up. Scalp and ears. Using Ketoconazole  2% shampoo as directed. Using Fluocinolone  oil on ears as directed. Tolerating current treatment regimen. Controlling well.   C/O excessive underarm sweating. Dur: years. Has used Drysol in the past.   The following portions of the chart were reviewed this encounter and updated as appropriate: medications, allergies, medical history  Review of Systems:  No other skin or systemic complaints except as noted in HPI or Assessment and Plan.  Objective  Well appearing patient in no apparent distress; mood and affect are within normal limits.  Areas Examined: face  Relevant physical exam findings are noted in the Assessment and Plan.    Assessment & Plan   SEBORRHEIC DERMATITIS Exam: Pink patches with greasy scale at scalp and ears. Chronic and persistent condition with duration or expected duration over one year. Condition is improving with treatment but not currently at goal. Seborrheic Dermatitis is a chronic persistent rash characterized by pinkness and scaling most commonly of the mid face but also can occur on the scalp (dandruff), ears; mid chest, mid back and groin.  It tends to be exacerbated by stress and cooler weather.  People who have neurologic disease may experience new onset or exacerbation of existing seborrheic dermatitis.  The condition is not curable but treatable and can be controlled. Treatment Plan: Cont Ketoconazole  shampoo as directed  Cont Fluocinolone  oil as directed to ears   HYPERHIDROSIS Exam: increased moisture of B/L axillae Chronic and persistent condition with duration or expected duration over one year. Condition is bothersome/symptomatic for patient. Currently  flared Counseling on Hyperhidrosis and Coordination of Care Hyperhidrosis is a chronic condition of increased perspiration in excess of the body's need for thermoregulation. It may or may not be associated with bromhidrosis (odor). Hyperhidrosis may be primary or secondary.  In both cases, the main symptom is excessive perspiration.  Primary hyperhidrosis is focal sweating of unknown cause; triggers may include emotions (eg, anxiety), physical activity, heat, and spicy food. Cause is not fully understood but is thought to result from overactivity of sweat glands due to neural (nerve) pathways. Primary hyperhidrosis occurs in both children and adults. It often begins in teenage years. It is less common in elderly individuals. It is estimated to affect between 1% and 3% of the population. A positive family history is common.  Symptoms-  Most commonly, excess sweat can be seen bilaterally on the palms of the hands, soles of the feet, craniofacial region, and axillae. When the skin is not excessively moist, it can be extremely dry with itchy, flaky, and sometimes cracked skin. Sweating generally subsides while sleeping.  Secondary hyperhidrosis is usually generalized and is associated with an underlying medical condition (eg, metabolic disorder, neurologic condition, infection, or malignancy) or medication use. Symptoms - Generalized sweating, nocturnal sweating (night sweats).  If secondary hyperhidrosis is suspected, laboratory tests (thyroid  function, CBC, blood glucose, renal and liver function, erythrocyte sedimentation rate, etc) may be useful to help identify an underlying cause. A purified protein derivative (PPD) skin test or interferon gamma release assays (T-SPOT and QuantiFERON-TB Gold) can screen for tuberculosis.  Treatment for Primary Hyperhidrosis may include: - topicals such as Drysol; Carpe; CertainDri; Qbrexa (topical glycopyrrolate); Sofpironium gel anticholinergic); Topical Oxybutynin 3%  gel. - Iontopheresis; -  Injectable Botox; - systemic treatments such as oral Glycopyrrolate; oral Oxybutynin; oral Clonidine. - Microwave Thermolysis (MiraDry) - Axillary curettage or Liposuction of Axillary sweat glands. - Surgical Sypathectomy.    Treatment Plan:  Start Drysol to underarms as directed Apply to both underarms every night at bedtime for 1 week then 2-3 nights per week as needed.  May consider QBrexa in the future.  Discussed in detail.    ROSACEA   Related Medications doxycycline  (VIBRA -TABS) 100 MG tablet Take 1 tablet (100 mg total) by mouth daily. with food SEBORRHEIC DERMATITIS   Related Medications ketoconazole  (NIZORAL ) 2 % shampoo apply three times per week, massage into scalp and leave in for 10 minutes before rinsing out PRIMARY FOCAL HYPERHIDROSIS   COUNSELING AND COORDINATION OF CARE   MEDICATION MANAGEMENT    ROSACEA Exam:  Mid face erythema with telangiectasias +/- scattered inflammatory papules at chin and glabella. Chronic and persistent condition with duration or expected duration over one year. Condition is improving with treatment but not currently at goal. Rosacea is a chronic progressive skin condition usually affecting the face of adults, causing redness and/or acne bumps. It is treatable but not curable. It sometimes affects the eyes (ocular rosacea) as well. It may respond to topical and/or systemic medication and can flare with stress, sun exposure, alcohol, exercise, topical steroids (including hydrocortisone/cortisone 10) and some foods.  Daily application of broad spectrum spf 30+ sunscreen to face is recommended to reduce flares. Treatment Plan Cont Doxycycline  100 mg once a day with food   Doxycycline  should be taken with food to prevent nausea. Do not lay down for 30 minutes after taking. Be cautious with sun exposure and use good sun protection while on this medication. Pregnant women should not take this medication.     Discussed Isotretinoin therapy an option, pt declines Isotretinoin therapy.  She is happy with continuing oral doxycycline .  She does not stay clear with this but does not want to pursue more aggressive therapy such as isotretinoin.  Long term medication management.  Patient is using long term (months to years) prescription medication  to control their dermatologic condition.  These medications require periodic monitoring to evaluate for efficacy and side effects.   Return in about 1 year (around 01/12/2025) for Seborrheic Dermatitis Follow Up, Rosacea Follow Up.  I, Kate Fought, CMA, am acting as scribe for Alm Rhyme, MD.   Documentation: I have reviewed the above documentation for accuracy and completeness, and I agree with the above.  Alm Rhyme, MD

## 2024-02-01 DIAGNOSIS — E063 Autoimmune thyroiditis: Secondary | ICD-10-CM | POA: Diagnosis not present

## 2024-02-01 DIAGNOSIS — E039 Hypothyroidism, unspecified: Secondary | ICD-10-CM | POA: Diagnosis not present

## 2024-02-01 DIAGNOSIS — D751 Secondary polycythemia: Secondary | ICD-10-CM | POA: Diagnosis not present

## 2024-02-22 DIAGNOSIS — D751 Secondary polycythemia: Secondary | ICD-10-CM | POA: Diagnosis not present

## 2024-02-22 DIAGNOSIS — N39498 Other specified urinary incontinence: Secondary | ICD-10-CM | POA: Diagnosis not present

## 2024-02-22 DIAGNOSIS — R202 Paresthesia of skin: Secondary | ICD-10-CM | POA: Diagnosis not present

## 2024-02-23 ENCOUNTER — Ambulatory Visit: Attending: Nurse Practitioner | Admitting: Nurse Practitioner

## 2024-02-23 ENCOUNTER — Encounter: Payer: Self-pay | Admitting: Nurse Practitioner

## 2024-02-23 DIAGNOSIS — M47816 Spondylosis without myelopathy or radiculopathy, lumbar region: Secondary | ICD-10-CM | POA: Diagnosis not present

## 2024-02-23 DIAGNOSIS — M25571 Pain in right ankle and joints of right foot: Secondary | ICD-10-CM | POA: Diagnosis not present

## 2024-02-23 DIAGNOSIS — Z79899 Other long term (current) drug therapy: Secondary | ICD-10-CM | POA: Insufficient documentation

## 2024-02-23 DIAGNOSIS — M25561 Pain in right knee: Secondary | ICD-10-CM | POA: Insufficient documentation

## 2024-02-23 DIAGNOSIS — M25562 Pain in left knee: Secondary | ICD-10-CM | POA: Insufficient documentation

## 2024-02-23 DIAGNOSIS — M25572 Pain in left ankle and joints of left foot: Secondary | ICD-10-CM | POA: Diagnosis not present

## 2024-02-23 DIAGNOSIS — Z5189 Encounter for other specified aftercare: Secondary | ICD-10-CM

## 2024-02-23 DIAGNOSIS — M4310 Spondylolisthesis, site unspecified: Secondary | ICD-10-CM | POA: Diagnosis not present

## 2024-02-23 DIAGNOSIS — G894 Chronic pain syndrome: Secondary | ICD-10-CM | POA: Insufficient documentation

## 2024-02-23 DIAGNOSIS — M545 Low back pain, unspecified: Secondary | ICD-10-CM | POA: Diagnosis not present

## 2024-02-23 DIAGNOSIS — G8929 Other chronic pain: Secondary | ICD-10-CM | POA: Insufficient documentation

## 2024-02-23 DIAGNOSIS — Z79891 Long term (current) use of opiate analgesic: Secondary | ICD-10-CM

## 2024-02-23 MED ORDER — HYDROCODONE-ACETAMINOPHEN 10-325 MG PO TABS: 1.0000 | ORAL_TABLET | Freq: Four times a day (QID) | ORAL | 0 refills | Status: DC | PRN

## 2024-02-23 NOTE — Progress Notes (Signed)
 PROVIDER NOTE: Interpretation of information contained herein should be left to medically-trained personnel. Specific patient instructions are provided elsewhere under Patient Instructions section of medical record. This document was created in part using AI and STT-dictation technology, any transcriptional errors that may result from this process are unintentional.  Patient: Alison Cox  Service: E/M   PCP: Dwight Trula SQUIBB, MD  DOB: 03-04-1956  DOS: 02/23/2024  Provider: Emmy MARLA Blanch, NP  MRN: [id number]  Delivery: Face-to-face  Specialty: Interventional Pain Management  Type: Established Patient  Setting: Ambulatory outpatient facility  Specialty designation: 09  Referring Prov.: Dwight Trula SQUIBB, MD  Location: Outpatient office facility       History of present illness (HPI) Alison Cox, a 68 y.o. year old female, is here today because of her Bilateral knee pain and bilateral ankle pain. 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  Pain Assessment: Severity of Chronic pain is reported as a 4 /10. Location: Back Lower/TO LEFT HIP. Onset: More than a month ago. Quality: Aching, Dull, Shooting, Sharp. Timing: Constant. Modifying factor(s): SITTING, MEDS. Vitals:  height is 5' 3 (1.6 m) and weight is 305 lb (138.3 kg) (abnormal). Her temperature is 98.8 F (37.1 C). Her blood pressure is 152/85 (abnormal) and her pulse  is 101 (abnormal). Her respiration is 17 and oxygen saturation is 94%.  BMI: Estimated body mass index is 54.03 kg/m as calculated from the following:   Height as of this encounter: 5' 3 (1.6 m).   Weight as of this encounter: 305 lb (138.3 kg).  Last encounter: 11/23/2023. Last procedure: Visit date not found.  Reason for encounter: medication management.   Discussed the use of AI scribe software for clinical note transcription with the patient, who gave verbal consent to proceed.  History of Present Illness   Alison Cox is a 68 year old female who presents with bilateral knee and left ankle pain. She experiences pain primarily in her bilateral knees and left ankle, with the left ankle being particularly severe at times. Additionally, she mentions pain in her lower back. She is currently on medication management with Norco 10-325 mg, which she takes every six hours as needed. No side effects from the medication, including constipation, are reported. She consumes a lot of fruits and vegetables and takes magnesium , which she believes helps. No change in medical history since last visit.  Patient's pain is at baseline.  Patient continues multimodal pain regimen as prescribed, which provides pain relief and improvement in functional status.    Pharmacotherapy Assessment   Hydrocodone -acetaminophen  (Norco) 10-325 mg tablet every 6 hours as needed for severe pain.  MME equals to 40 Monitoring: [place] PMP: PDMP reviewed during this encounter.       Pharmacotherapy: No side-effects or adverse reactions reported. Compliance: No problems identified. Effectiveness: Clinically acceptable.  Dayna Pulling, RN  02/23/2024  2:03 PM  Sign when Signing Visit Nursing Pain Medication Assessment:  Safety precautions to be maintained throughout the outpatient stay will  include: orient to surroundings, keep bed in low position, maintain call bell within reach at all times, provide assistance with transfer out of  bed and ambulation.  Medication Inspection Compliance: Pill count conducted under aseptic conditions, in front of the patient. Neither the pills nor the bottle was removed from the patient's sight at any time. Once count was completed pills were immediately returned to the patient in their original bottle.  Medication: Hydrocodone /APAP Pill/Patch Count: 75 of 120 pills/patches remain Pill/Patch Appearance: Markings consistent with prescribed medication Bottle Appearance: Standard pharmacy container. Clearly labeled. Filled Date: 10 / 24 / 2025 Last Medication intake:  Today    UDS:  Summary  Date Value Ref Range Status  03/12/2023 Note  Final    Comment:    ==================================================================== [employer]ox[employer] Select 13 (MW) ==================================================================== Test                             Result       Flag       Units  Drug Present and Declared for Prescription Verification   Hydrocodone                     2220         EXPECTED   ng/mg creat   Hydromorphone                  658          EXPECTED   ng/mg creat   Dihydrocodeine                 428          EXPECTED   ng/mg creat   Norhydrocodone                 1433         EXPECTED   ng/mg creat    Sources of hydrocodone  include scheduled prescription medications.    Hydromorphone, dihydrocodeine and norhydrocodone are expected    metabolites of hydrocodone . Hydromorphone and dihydrocodeine are    also available as scheduled prescription medications.  ==================================================================== Test                      Result    Flag   Units      Ref Range   Creatinine              245              mg/dL      >=79 ==================================================================== Declared Medications:  The flagging and interpretation on this report are based on the  following declared medications.  Unexpected results may arise from   inaccuracies in the declared medications.   **Note: The testing scope of this panel includes these medications:   Hydrocodone  (Norco)   **Note: The testing scope of this panel does not include the  following reported medications:   Acetaminophen  (Norco)  Albuterol (Ventolin HFA)  Amlodipine (Norvasc)  Calcium  Cholecalciferol   Ciclopirox  Crisaborole  Doxycycline   Fluocinolone   Furosemide (Lasix)  Ketoconazole  (Nizoral )  Levothyroxine (Synthroid)  Losartan (Cozaar)  Magnesium   Meloxicam  (Mobic )  Nystatin  Prednisolone  Spironolactone  Triamcinolone ==================================================================== For clinical consultation, please call [phone]. ====================================================================     No results found for: CBDTHCR No results found for: D8THCCBX No results found for: D9THCCBX  ROS  Constitutional: Denies any fever or chills Gastrointestinal: No reported hemesis, hematochezia, vomiting, or acute  GI distress Musculoskeletal: Bilateral knee pain, bilateral ankle pain (L>R), low back pain Neurological: No reported episodes of acute onset apraxia, aphasia, dysarthria, agnosia, amnesia, paralysis, loss of coordination, or loss of consciousness  Medication Review  Cholecalciferol , Ciclopirox, Crisaborole, Fluocinolone  Acetonide, HYDROcodone -acetaminophen , Magnesium  Oxide -Mg Supplement, albuterol, aluminum  chloride, amLODipine, calcium carbonate, doxycycline , estradiol, furosemide, ketoconazole , levothyroxine, losartan, meloxicam , nystatin-triamcinolone, prednisoLONE acetate, and spironolactone  History Review  Allergy: Alison Cox is allergic to penicillins, shellfish allergy, and sulfa antibiotics. Drug: Alison Cox  reports no history of drug use. Alcohol:  reports no history of alcohol use. Tobacco:  reports that she has never smoked. She has never used smokeless tobacco. Social: Alison Cox  reports that she  has never smoked. She has never used smokeless tobacco. She reports that she does not drink alcohol and does not use drugs. 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 Surgical: Alison Cox  has a past surgical history that includes Endometrial ablation; DILATATION AND CURETTAGE; Corneal transplant; and Corneal transplant. Family: family history includes Hypertension in her father and mother; Thyroid  disease in her father.  Laboratory Chemistry Profile   Renal Lab Results  Component Value Date   BUN 15 02/12/2022   CREATININE 1.00 02/12/2022   BCR 15 02/12/2022   GFRAA 70 01/17/2019   GFRNONAA 61 01/17/2019    Hepatic Lab Results  Component Value Date   AST 27 02/12/2022   ALT 47 05/01/2016   ALBUMIN 4.8 02/12/2022   ALKPHOS 114 02/12/2022    Electrolytes Lab Results  Component Value Date   NA 141 02/12/2022   K 4.0 02/12/2022   CL 103 02/12/2022   CALCIUM 10.2 02/12/2022   MG 1.9 02/12/2022    Bone Lab Results  Component Value Date   25OHVITD1 30 02/12/2022   25OHVITD2 <1.0 02/12/2022   25OHVITD3 30 02/12/2022    Inflammation (CRP: Acute Phase) (ESR: Chronic Phase) Lab Results  Component Value Date   CRP 8 02/12/2022   ESRSEDRATE 22 02/12/2022         Note: Above Lab results reviewed.  Recent Imaging Review   Narrative & Impression  CLINICAL DATA:  Left knee pain/arthralgia. Chronic bilateral knee pain.   EXAM: LEFT KNEE - COMPLETE 4+ VIEW   COMPARISON:  None Available.   FINDINGS: The bones are subjectively under mineralized. Moderate medial tibiofemoral joint space narrowing. Subchondral cystic change in the medial tibiofemoral compartment. Moderate tricompartmental peripheral  spurring. No acute or evidence of prior fracture. No erosive change, bony destruction or focal bone lesions. Small knee joint effusion. Minimal soft tissue edema.   IMPRESSION: Tricompartmental osteoarthritis, moderate in the medial tibiofemoral compartment. Small knee joint effusion.     Electronically Signed   By: Andrea Gasman M.D.   On: 02/20/2022 11:56   Physical Exam  Vitals: BP (!) 152/85   Pulse (!) 101   Temp 98.8 F (37.1 C)   Resp 17   Ht 5' 3 (1.6 m)   Wt (!) 305 lb (138.3 kg)   SpO2 94%   BMI 54.03 kg/m  BMI: Estimated body mass index is 54.03 kg/m as calculated from the following:   Height as of this encounter: 5' 3 (1.6 m).   Weight as of this encounter: 305 lb (138.3 kg). Ideal: Ideal body weight: 52.4 kg (115 lb 8.3 oz)  Adjusted ideal body weight: 86.8 kg (191 lb 5 oz) General appearance: Well nourished, well developed, and well hydrated. In no apparent acute distress Mental status: Alert, oriented x 3 (person, place, & time)       Respiratory: No evidence of acute respiratory distress Eyes: PERLA  Musculoskeletal: Bilateral knee pain worse with walking prolonged standing and weightbearing Bilateral ankle pain (L>R) +LBP Assessment   Diagnosis Status  1. Chronic knee pain (1ry area of Pain) (Bilateral) (R>L)   2. Chronic pain syndrome   3. Encounter for medication management   4. Lumbar spondylosis   5. Grade 1 Anterolisthesis of L4 over L5   6. Chronic low back pain (3ry area of Pain) (midline)   7. Pharmacologic therapy   8. Chronic ankle pain (2ry area of Pain) (Bilateral) (L>R)    Controlled Controlled Controlled   Updated Problems: Problem  Encounter for Medication Management    Plan of Care  Problem-specific:  Assessment and Plan    Chronic pain syndrome with bilateral knee, left ankle, and low back pain Chronic pain syndrome managed with Norco 10-325 mg every six hours as needed. No side effects or adverse reaction reported  to current pain medication regimen. Constipation managed with diet and magnesium . - Continue Norco 10-325 mg every six hours as needed. - Send prescription to [facility] in [place]. - Perform urine drug screening.   Chronic knee pain: The patient continues experiencing bilateral knee pain, bilateral ankle pain and low back pain, which she is continuous managed with prescribed pain medication.  No side effects or adverse reaction reported to medication.  Her knee x-ray showed tricompartmental osteoarthritis, moderate in the medial tibiofemoral compartment and small knee joint effusion.   Encounter for medication management: Patient's pain is well-controlled with hydrocodone -acetaminophen  (Norco), will continue on current medication regimen.  Prescribing drug monitoring (PMP) reviewed, findings consistent with the use of prescribed medication and no evidence of narcotic misuse or abuse. Routine UDS ordered today.  Schedule follow-up in 90 days for medication management.     Alison Cox has a current medication list which includes the following long-term medication(s): albuterol, amlodipine, calcium carbonate, cholecalciferol , furosemide, levothyroxine, losartan, magnesium  oxide -mg supplement, meloxicam , spironolactone, [START ON 03/13/2024] hydrocodone -acetaminophen , [START ON 04/12/2024] hydrocodone -acetaminophen , and [START ON 05/12/2024] hydrocodone -acetaminophen .  Pharmacotherapy (Medications Ordered): Meds ordered this encounter  Medications   HYDROcodone -acetaminophen  (NORCO) 10-325 MG tablet    Sig: Take 1 tablet by mouth every 6 (six) hours as needed for severe pain (pain score 7-10). Must last 30 days    Dispense:  120 tablet    Refill:  0    Chronic Pain: STOP Act (Not applicable) Fill 1 day early if closed on refill date.   HYDROcodone -acetaminophen  (NORCO) 10-325 MG tablet    Sig: Take 1 tablet by mouth every 6 (six) hours as needed for severe pain (pain score 7-10). Must  last 30 days    Dispense:  120 tablet    Refill:  0    Chronic Pain: STOP Act (Not applicable) Fill 1 day early if closed on refill date.   HYDROcodone -acetaminophen  (NORCO) 10-325 MG tablet    Sig: Take 1 tablet by mouth every 6 (six) hours as needed for severe pain (pain score 7-10). Must last 30 days    Dispense:  120 tablet    Refill:  0    Chronic Pain: STOP Act (Not applicable) Fill 1 day early if closed on refill date.   Orders:  Orders Placed This  Encounter  Procedures   Tox[employer]SU[employer] Select 13 (MW), Urine    Volume: 30 ml(s). Minimum 3 ml of urine is needed. Document temperature of fresh sample. Indications: Long term (current) use of opiate analgesic (S20.108)    Release to patient:   Immediate        Return in about 3 months (around 05/25/2024) for (F2F), (MM), Emmy Blanch NP.    Recent Visits No visits were found meeting these conditions. Showing recent visits within past 90 days and meeting all other requirements Today's Visits Date Type Provider Dept  02/23/24 Office Visit Lorrin Nawrot K, NP [facility]  Showing today's visits and meeting all other requirements Future Appointments Date Type Provider Dept  05/23/24 Appointment Zuzanna Maroney K, NP [facility]  Showing future appointments within next 90 days and meeting all other requirements  I discussed the assessment and treatment plan with the patient. The patient was provided an opportunity to ask questions and all were answered. The patient agreed with the plan and demonstrated an understanding of the instructions.  Patient advised to call back or seek an in-person evaluation if the symptoms or condition worsens.  I personally spent a total of 30 minutes in the care of the patient today including preparing to see the patient, getting/reviewing separately obtained history, performing a medically appropriate exam/evaluation, counseling and educating, placing orders, referring and communicating with  other health care professionals, documenting clinical information in the EHR, independently interpreting results, communicating results, and coordinating care.   Note by: Aayla Marrocco K Layah Skousen, NP (TTS and AI technology used. I apologize for any typographical errors that were not detected and corrected.) Date: 02/23/2024; Time: 2:35 PM

## 2024-02-23 NOTE — Progress Notes (Signed)
 Nursing Pain Medication Assessment:  Safety precautions to be maintained throughout the outpatient stay will include: orient to surroundings, keep bed in low position, maintain call bell within reach at all times, provide assistance with transfer out of bed and ambulation.  Medication Inspection Compliance: Pill count conducted under aseptic conditions, in front of the patient. Neither the pills nor the bottle was removed from the patient's sight at any time. Once count was completed pills were immediately returned to the patient in their original bottle.  Medication: Hydrocodone /APAP Pill/Patch Count: 75 of 120 pills/patches remain Pill/Patch Appearance: Markings consistent with prescribed medication Bottle Appearance: Standard pharmacy container. Clearly labeled. Filled Date: 10 / 24 / 2025 Last Medication intake:  Today

## 2024-02-26 LAB — TOXASSURE SELECT 13 (MW), URINE

## 2024-05-18 ENCOUNTER — Encounter: Payer: Self-pay | Admitting: Nurse Practitioner

## 2024-05-18 ENCOUNTER — Ambulatory Visit: Admitting: Nurse Practitioner

## 2024-05-18 VITALS — BP 156/93 | HR 103 | Temp 97.3°F | Resp 20 | Ht 63.0 in | Wt 300.0 lb

## 2024-05-18 DIAGNOSIS — M4310 Spondylolisthesis, site unspecified: Secondary | ICD-10-CM | POA: Insufficient documentation

## 2024-05-18 DIAGNOSIS — M25561 Pain in right knee: Secondary | ICD-10-CM | POA: Diagnosis present

## 2024-05-18 DIAGNOSIS — G8929 Other chronic pain: Secondary | ICD-10-CM | POA: Diagnosis present

## 2024-05-18 DIAGNOSIS — M47816 Spondylosis without myelopathy or radiculopathy, lumbar region: Secondary | ICD-10-CM | POA: Insufficient documentation

## 2024-05-18 DIAGNOSIS — M545 Low back pain, unspecified: Secondary | ICD-10-CM | POA: Diagnosis present

## 2024-05-18 DIAGNOSIS — M25571 Pain in right ankle and joints of right foot: Secondary | ICD-10-CM | POA: Insufficient documentation

## 2024-05-18 DIAGNOSIS — M25572 Pain in left ankle and joints of left foot: Secondary | ICD-10-CM | POA: Diagnosis present

## 2024-05-18 DIAGNOSIS — Z79899 Other long term (current) drug therapy: Secondary | ICD-10-CM | POA: Insufficient documentation

## 2024-05-18 DIAGNOSIS — M25562 Pain in left knee: Secondary | ICD-10-CM | POA: Insufficient documentation

## 2024-05-18 DIAGNOSIS — G894 Chronic pain syndrome: Secondary | ICD-10-CM | POA: Diagnosis present

## 2024-05-18 MED ORDER — HYDROCODONE-ACETAMINOPHEN 10-325 MG PO TABS: 1.0000 | ORAL_TABLET | Freq: Four times a day (QID) | ORAL | 0 refills | Status: AC | PRN

## 2024-05-18 NOTE — Progress Notes (Signed)
 Nursing Pain Medication Assessment:  Safety precautions to be maintained throughout the outpatient stay will include: orient to surroundings, keep bed in low position, maintain call bell within reach at all times, provide assistance with transfer out of bed and ambulation.  Medication Inspection Compliance: Pill count conducted under aseptic conditions, in front of the patient. Neither the pills nor the bottle was removed from the patient's sight at any time. Once count was completed pills were immediately returned to the patient in their original bottle.  Medication: Hydrocodone /APAP Pill/Patch Count: 104 of 120 pills/patches remain Pill/Patch Appearance: Markings consistent with prescribed medication Bottle Appearance: Standard pharmacy container. Clearly labeled. Filled Date: 1 / 22 / 2026 Last Medication intake:  Today

## 2024-05-18 NOTE — Progress Notes (Signed)
 PROVIDER NOTE: Interpretation of information contained herein should be left to medically-trained personnel. Specific patient instructions are provided elsewhere under Patient Instructions section of medical record. This document was created in part using AI and STT-dictation technology, any transcriptional errors that may result from this process are unintentional.  Patient: Alison Cox  Service: E/M   PCP: Dwight Trula SQUIBB, MD  DOB: 1955-11-04  DOS: 05/18/2024  Provider: Emmy MARLA Blanch, NP  MRN: [id number]  Delivery: Face-to-face  Specialty: Interventional Pain Management  Type: Established Patient  Setting: Ambulatory outpatient facility  Specialty designation: 09  Referring Prov.: Dwight Trula SQUIBB, MD  Location: Outpatient office facility       History of present illness (HPI) [name], a 69 y.o. year old female, is here today because of her bilateral knee pain. [name] primary complain today is Knee Pain  Pertinent problems: 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  Pain Assessment: Severity of Chronic pain is  reported as a 1 /10. Location: Knee Right, Left/Denies. Onset: More than a month ago. Quality: Aching, Dull, Sharp. Timing: Constant. Modifying factor(s): Sitting down. Vitals:  height is 5' 3 (1.6 m) and weight is 300 lb (136.1 kg). Her temporal temperature is 97.3 F (36.3 C) (abnormal). Her blood pressure is 156/93 (abnormal) and her pulse is 103 (abnormal). Her respiration is 20 and oxygen saturation is 96%.  BMI: Estimated body mass index is 53.14 kg/m as calculated from the following:   Height as of this encounter: 5' 3 (1.6 m).   Weight as of this encounter: 300 lb (136.1 kg).  Last encounter: 02/23/2024. Last procedure: Visit date not found.  Reason for encounter: medication management. No change in medical history since last visit.  Patient's pain is at baseline.  Patient continues multimodal pain regimen as prescribed.  States that it provides pain relief and improvement in functional status.   Discussed the use of AI scribe software for clinical note transcription with the patient, who gave verbal consent to proceed.  History of Present Illness   Alison Cox is a 69 year old female who presents with knee, low back, and ankle pain. She experiences pain primarily in her bilateral knees and left ankle, with the left ankle being particularly when walking. She describes her pain as 'just arthritis' and 'really bad.' Previous interventions, including knee injections, have provided only limited and short-lived relief. Additionally, she mentions pain in her lower back. She is currently on medication management with Norco 10-325 mg, which she takes every six hours as needed. No side effects from the medication, including constipation, are reported. She consumes a lot of fruits and vegetables and takes magnesium , which she believes helps.     Pharmacotherapy Assessment   Hydrocodone -acetaminophen  (Norco) 10-325 mg tablet every  6 hours as needed for severe pain.  MME equals to  40 Monitoring: [place] PMP: PDMP reviewed during this encounter.       Pharmacotherapy: No side-effects or adverse reactions reported. Compliance: No problems identified. Effectiveness: Clinically acceptable.  Erlene Doyal SAUNDERS, [place]  05/18/2024  1:16 PM  Sign when Signing Visit Nursing Pain Medication Assessment:  Safety precautions to be maintained throughout the outpatient stay will include: orient to surroundings, keep bed in low position, maintain call bell within reach at all times, provide assistance with transfer out of bed and ambulation.  Medication Inspection Compliance: Pill count conducted under aseptic conditions, in front of the patient. Neither the pills nor the bottle was removed from the patient's sight at any time. Once count was completed pills were immediately returned to the patient in their original bottle.  Medication: Hydrocodone /APAP Pill/Patch Count: 104 of 120 pills/patches remain Pill/Patch Appearance: Markings consistent with prescribed medication Bottle Appearance: Standard pharmacy container. Clearly labeled. Filled Date: 1 / 22 / 2026 Last Medication intake:  Today    UDS:  Summary  Date Value Ref Range Status  02/23/2024 FINAL  Final    Comment:    ==================================================================== [employer] [facility] [employer] (MW) ==================================================================== Test                             Result       Flag       Units  Drug Present and Declared for Prescription Verification   Hydrocodone                     1678         EXPECTED   ng/mg creat   Hydromorphone                  393          EXPECTED   ng/mg creat   Dihydrocodeine                 541          EXPECTED   ng/mg creat   Norhydrocodone                 1627         EXPECTED   ng/mg creat    Sources of hydrocodone  include scheduled prescription medications.    Hydromorphone, dihydrocodeine and norhydrocodone are expected    metabolites of  hydrocodone . Hydromorphone and dihydrocodeine are    also available as scheduled prescription medications.  ==================================================================== Test                      Result    Flag   Units      Ref Range   Creatinine              241              mg/dL      >=79 ==================================================================== Declared Medications:  The flagging and interpretation on this report are based on the  following declared medications.  Unexpected results may arise from  inaccuracies in the declared medications.   **Note: The testing scope of this panel includes these medications:   Hydrocodone  (Norco)   **Note: The testing scope of this panel does not include the  following reported medications:   Acetaminophen  (Norco)  Albuterol (Ventolin HFA)  Aluminum  chloride (Drysol)  Amlodipine (Norvasc)  Calcium  Cholecalciferol   Ciclopirox  Crisaborole  Doxycycline   Estradiol  Eye Drops  Fluocinolone   Furosemide (Lasix)  Ketoconazole  (Nizoral )  Levothyroxine (Synthroid)  Magnesium  (Mag-Ox)  Meloxicam  (Mobic )  Spironolactone (Aldactone)  Topical ==================================================================== For clinical consultation, please call [phone]. ====================================================================     No results found for: CBDTHCR No results found for: D8THCCBX No results found for: D9THCCBX  ROS  Constitutional: Denies any fever or chills Gastrointestinal: No reported hemesis, hematochezia, vomiting, or acute GI distress Musculoskeletal:  Bilateral knee pain, bilateral ankle pain (L>R), low back pain  Neurological: No reported episodes of acute onset apraxia, aphasia, dysarthria, agnosia, amnesia, paralysis, loss of coordination, or loss of consciousness  Medication Review  Cholecalciferol , Ciclopirox, Crisaborole, Fluocinolone  Acetonide, HYDROcodone -acetaminophen , Magnesium   Oxide -Mg Supplement, albuterol, aluminum  chloride, amLODipine, calcium carbonate, doxycycline , estradiol, furosemide, ketoconazole , levothyroxine, losartan, meloxicam , nystatin-triamcinolone, prednisoLONE acetate, and spironolactone  History Review  Allergy: Ms. Vannatter is allergic to penicillins, shellfish allergy, and sulfa antibiotics. Drug: Ms. Keeling  reports no history of drug use. Alcohol:  reports no history of alcohol use. Tobacco:  reports that she has never smoked. She has never used smokeless tobacco. Social: Ms. Larusso  reports that she has never smoked. She has never used smokeless tobacco. She reports that she does not drink alcohol and does not use drugs. 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 Surgical: Ms. Ragsdale  has a past surgical history that includes Endometrial ablation; DILATATION AND CURETTAGE; Corneal transplant; and Corneal transplant. Family: family history includes Hypertension in her father and mother; Thyroid  disease in her father.  Laboratory Chemistry Profile   Renal Lab Results  Component Value Date   BUN 15 02/12/2022   CREATININE 1.00 02/12/2022   BCR 15 02/12/2022   GFRAA 70 01/17/2019   GFRNONAA 61 01/17/2019    Hepatic Lab Results  Component Value Date   AST 27 02/12/2022   ALT 47 05/01/2016   ALBUMIN 4.8 02/12/2022   ALKPHOS 114 02/12/2022    Electrolytes Lab Results  Component Value Date   NA 141 02/12/2022   K 4.0 02/12/2022   CL 103 02/12/2022   CALCIUM 10.2 02/12/2022   MG 1.9 02/12/2022    Bone Lab Results  Component Value Date   25OHVITD1 30 02/12/2022   25OHVITD2 <1.0 02/12/2022   25OHVITD3 30 02/12/2022    Inflammation (CRP: Acute Phase) (ESR: Chronic  Phase) Lab Results  Component Value Date   CRP 8 02/12/2022   ESRSEDRATE 22 02/12/2022         Note: Above Lab results reviewed.  Recent Imaging Review  US  THYROID  CLINICAL DATA:  Thyroid  nodule  EXAM: THYROID  ULTRASOUND  TECHNIQUE: Ultrasound examination of the thyroid  gland and adjacent soft tissues was performed.  COMPARISON:  2017  FINDINGS: Parenchymal Echotexture: Mildly heterogenous  Isthmus: 0.6 cm  Right lobe: 4.6 x 1.8 x 1.7 cm  Left lobe: 3.4 x 1.6 x 1.4 cm  _________________________________________________________  Estimated total number of nodules >/= 1 cm: 2  Number of spongiform nodules >/=  2 cm not described below (TR1): 0  Number of mixed cystic and solid nodules >/= 1.5 cm not described below (TR2): 0  _________________________________________________________  Nodule labeled 1 is a previously biopsied solid nodule in the mid right thyroid  gland measuring up to 1.6 cm, previously 2.0 cm in 2017. It appears slightly decreased in size with  similar morphologic features.  Nodule labeled 2 is a solid isoechoic TR 3 nodule in the inferior right thyroid  lobe, not well visualized on previous exam, measuring 1.1 x 1.0 x 0.5 cm. Given size (<1.4 cm) and appearance, this nodule does NOT meet TI-RADS criteria for biopsy or dedicated follow-up.  IMPRESSION: 1. Multinodular thyroid  gland. 2. Nodule labeled 1 was previously biopsied, and appears to demonstrate slight decrease in size since 2017. Correlate with biopsy results. 3. No other nodules on today's exam meet criteria for further dedicated follow-up or biopsy.  The above is in keeping with the ACR TI-RADS recommendations - J Am Coll Radiol 2017;14:587-595.  Electronically Signed   By: Felton Fry M.D.   On: 07/09/2022 15:29 Note: Reviewed        Narrative & Impression  CLINICAL DATA:  Left knee pain/arthralgia. Chronic bilateral knee pain.   EXAM: LEFT KNEE - COMPLETE 4+ VIEW    COMPARISON:  None Available.   FINDINGS: The bones are subjectively under mineralized. Moderate medial tibiofemoral joint space narrowing. Subchondral cystic change in the medial tibiofemoral compartment. Moderate tricompartmental peripheral spurring. No acute or evidence of prior fracture. No erosive change, bony destruction or focal bone lesions. Small knee joint effusion. Minimal soft tissue edema.   IMPRESSION: Tricompartmental osteoarthritis, moderate in the medial tibiofemoral compartment. Small knee joint effusion.     Electronically Signed   By: Andrea Gasman M.D.   On: 02/20/2022 11:56    Physical Exam  Vitals: BP (!) 156/93 (BP Location: Right Arm, Patient Position: Sitting, Cuff Size: Normal)   Pulse (!) 103   Temp (!) 97.3 F (36.3 C) (Temporal)   Resp 20   Ht 5' 3 (1.6 m)   Wt 300 lb (136.1 kg)   SpO2 96%   BMI 53.14 kg/m  BMI: Estimated body mass index is 53.14 kg/m as calculated from the following:   Height as of this encounter: 5' 3 (1.6 m).   Weight as of this encounter: 300 lb (136.1 kg). Ideal: Ideal body weight: 52.4 kg (115 lb 8.3 oz) Adjusted ideal body weight: 85.9 kg (189 lb 5 oz) General appearance: Well nourished, well developed, and well hydrated. In no apparent acute distress Mental status: Alert, oriented x 3 (person, place, & time)       Respiratory: No evidence of acute respiratory distress Eyes: PERLA  Musculoskeletal: Bilateral knee pain worse with walking prolonged standing and weightbearing Bilateral ankle pain (L>R) +LBP Assessment   Diagnosis Status  1. Chronic knee pain (1ry area of Pain) (Bilateral) (R>L)   2. Lumbar spondylosis   3. Pharmacologic therapy   4. Grade 1 Anterolisthesis of L4 over L5   5. Encounter for medication management   6. Chronic low back pain (3ry area of Pain) (midline)   7. Chronic pain syndrome   8. Chronic ankle pain (2ry area of Pain) (Bilateral) (L>R)    Controlled Controlled Controlled    Updated Problems: No problems updated.  Plan of Care  Problem-specific:  Assessment and Plan    Chronic bilateral knee pain Likely due to arthritis. Previous knee injections provided limited relief. The patient continues experiencing bilateral knee pain, bilateral ankle pain and low back pain, which she is continuous managed with prescribed pain medication.  No side effects or adverse reaction reported to medication.  Her knee x-ray showed tricompartmental osteoarthritis, moderate in the medial tibiofemoral compartment and small knee joint effusion.   Chronic bilateral low back pain Likely due to arthritis.  Chronic bilateral  ankle pain Likely due to arthritis, exacerbated by walking.   Pharmacologic therapy: Patient's pain is controlled with hydrocodone -acetaminophen  (Norco), will continue on current medication regimen.  Prescribing drug monitoring (PMP) reviewed, findings consistent with the use of prescribed medication and no evidence of narcotic misuse or abuse. Urine drug screening (UDS) up to date.  The patient was advised to continue consumes a lot of fruits and vegetables and takes magnesium  and drink plenty of water to prevent opioid induced constipation. Schedule follow-up in 90 days for medication management.     Ms. Patra R Chaney has a current medication list which includes the following long-term medication(s): albuterol, amlodipine, cholecalciferol , furosemide, levothyroxine, losartan, magnesium  oxide -mg supplement, meloxicam , spironolactone, calcium carbonate, [START ON 06/11/2024] hydrocodone -acetaminophen , [START ON 07/11/2024] hydrocodone -acetaminophen , and [START ON 08/10/2024] hydrocodone -acetaminophen .  Pharmacotherapy (Medications Ordered): Meds ordered this encounter  Medications   HYDROcodone -acetaminophen  (NORCO) 10-325 MG tablet    Sig: Take 1 tablet by mouth every 6 (six) hours as needed for severe pain (pain score 7-10). Must last 30 days    Dispense:  120  tablet    Refill:  0    Chronic Pain: STOP Act (Not applicable) Fill 1 day early if closed on refill date.   HYDROcodone -acetaminophen  (NORCO) 10-325 MG tablet    Sig: Take 1 tablet by mouth every 6 (six) hours as needed for severe pain (pain score 7-10). Must last 30 days    Dispense:  120 tablet    Refill:  0    Chronic Pain: STOP Act (Not applicable) Fill 1 day early if closed on refill date.   HYDROcodone -acetaminophen  (NORCO) 10-325 MG tablet    Sig: Take 1 tablet by mouth every 6 (six) hours as needed for severe pain (pain score 7-10). Must last 30 days    Dispense:  120 tablet    Refill:  0    Chronic Pain: STOP Act (Not applicable) Fill 1 day early if closed on refill date.   Orders:  No orders of the defined types were placed in this encounter.      Return in about 3 months (around 08/16/2024) for (F2F), (MM), Emmy Blanch NP.    Recent Visits Date Type Provider Dept  02/23/24 Office Visit Dilan Novosad K, NP [facility]  Showing recent visits within past 90 days and meeting all other requirements Today's Visits Date Type Provider Dept  05/18/24 Office Visit Hillel Card K, NP [facility]  Showing today's visits and meeting all other requirements Future Appointments No visits were found meeting these conditions. Showing future appointments within next 90 days and meeting all other requirements  I discussed the assessment and treatment plan with the patient. The patient was provided an opportunity to ask questions and all were answered. The patient agreed with the plan and demonstrated an understanding of the instructions.  Patient advised to call back or seek an in-person evaluation if the symptoms or condition worsens.  I personally spent a total of 30 minutes in the care of the patient today including preparing to see the patient, getting/reviewing separately obtained history, performing a medically appropriate exam/evaluation, counseling and  educating, placing orders, referring and communicating with other health care professionals, documenting clinical information in the EHR, independently interpreting results, communicating results, and coordinating care.   Note by: Dameshia Seybold K Tailor Lucking, NP (TTS and AI technology used. I apologize for any typographical errors that were not detected and corrected.) Date: 05/18/2024; Time: 1:38 PM

## 2024-05-18 NOTE — Patient Instructions (Signed)

## 2024-05-23 ENCOUNTER — Encounter: Admitting: Nurse Practitioner

## 2024-08-08 ENCOUNTER — Encounter: Admitting: Nurse Practitioner

## 2025-01-18 ENCOUNTER — Ambulatory Visit: Admitting: Dermatology
